# Patient Record
Sex: Male | Born: 1945 | Race: Black or African American | Hispanic: No | Marital: Married | Smoking: Former smoker
Health system: Southern US, Community
[De-identification: ages and names within clinical notes are randomized; demographics above are authoritative.]

## PROBLEM LIST (undated history)

## (undated) DIAGNOSIS — N189 Chronic kidney disease, unspecified: Secondary | ICD-10-CM

## (undated) DIAGNOSIS — E0590 Thyrotoxicosis, unspecified without thyrotoxic crisis or storm: Secondary | ICD-10-CM

## (undated) DIAGNOSIS — I219 Acute myocardial infarction, unspecified: Secondary | ICD-10-CM

## (undated) DIAGNOSIS — I4891 Unspecified atrial fibrillation: Secondary | ICD-10-CM

## (undated) DIAGNOSIS — I87011 Postthrombotic syndrome with ulcer of right lower extremity: Secondary | ICD-10-CM

## (undated) DIAGNOSIS — M109 Gout, unspecified: Secondary | ICD-10-CM

## (undated) DIAGNOSIS — M1990 Unspecified osteoarthritis, unspecified site: Secondary | ICD-10-CM

## (undated) DIAGNOSIS — Z95810 Presence of automatic (implantable) cardiac defibrillator: Secondary | ICD-10-CM

## (undated) DIAGNOSIS — E079 Disorder of thyroid, unspecified: Secondary | ICD-10-CM

## (undated) DIAGNOSIS — I4901 Ventricular fibrillation: Secondary | ICD-10-CM

## (undated) DIAGNOSIS — L89509 Pressure ulcer of unspecified ankle, unspecified stage: Secondary | ICD-10-CM

## (undated) DIAGNOSIS — I82409 Acute embolism and thrombosis of unspecified deep veins of unspecified lower extremity: Secondary | ICD-10-CM

## (undated) DIAGNOSIS — G629 Polyneuropathy, unspecified: Secondary | ICD-10-CM

## (undated) DIAGNOSIS — I255 Ischemic cardiomyopathy: Secondary | ICD-10-CM

## (undated) DIAGNOSIS — L97909 Non-pressure chronic ulcer of unspecified part of unspecified lower leg with unspecified severity: Secondary | ICD-10-CM

## (undated) DIAGNOSIS — I509 Heart failure, unspecified: Secondary | ICD-10-CM

## (undated) DIAGNOSIS — E119 Type 2 diabetes mellitus without complications: Secondary | ICD-10-CM

## (undated) DIAGNOSIS — IMO0001 Reserved for inherently not codable concepts without codable children: Secondary | ICD-10-CM

## (undated) DIAGNOSIS — I83009 Varicose veins of unspecified lower extremity with ulcer of unspecified site: Secondary | ICD-10-CM

## (undated) HISTORY — DX: Chronic kidney disease, unspecified: N18.9

## (undated) HISTORY — DX: Unspecified osteoarthritis, unspecified site: M19.90

## (undated) HISTORY — PX: CORONARY ANGIOPLASTY: SHX604

## (undated) HISTORY — DX: Acute embolism and thrombosis of unspecified deep veins of unspecified lower extremity: I82.409

## (undated) HISTORY — DX: Type 2 diabetes mellitus without complications: E11.9

## (undated) HISTORY — DX: Varicose veins of unspecified lower extremity with ulcer of unspecified site: I83.009

## (undated) HISTORY — DX: Disorder of thyroid, unspecified: E07.9

## (undated) HISTORY — PX: OTHER SURGICAL HISTORY: SHX169

## (undated) HISTORY — DX: Polyneuropathy, unspecified: G62.9

## (undated) HISTORY — DX: Varicose veins of unspecified lower extremity with ulcer of unspecified site: L97.909

## (undated) HISTORY — DX: Unspecified atrial fibrillation: I48.91

## (undated) HISTORY — PX: INSERT / REPLACE / REMOVE PACEMAKER: SUR710

## (undated) HISTORY — DX: Postthrombotic syndrome with ulcer of right lower extremity: I87.011

## (undated) HISTORY — DX: Heart failure, unspecified: I50.9

## (undated) HISTORY — DX: Gout, unspecified: M10.9

## (undated) HISTORY — DX: Ventricular fibrillation: I49.01

## (undated) HISTORY — DX: Ischemic cardiomyopathy: I25.5

---

## 1997-09-12 ENCOUNTER — Encounter (HOSPITAL_COMMUNITY): Admission: RE | Admit: 1997-09-12 | Discharge: 1997-12-11 | Payer: Self-pay | Admitting: Psychiatry

## 2005-05-11 ENCOUNTER — Ambulatory Visit: Payer: Self-pay | Admitting: Internal Medicine

## 2005-05-12 ENCOUNTER — Ambulatory Visit: Payer: Self-pay | Admitting: Internal Medicine

## 2005-05-31 ENCOUNTER — Other Ambulatory Visit: Payer: Self-pay

## 2005-05-31 ENCOUNTER — Inpatient Hospital Stay: Payer: Self-pay | Admitting: Cardiology

## 2009-03-18 ENCOUNTER — Ambulatory Visit: Payer: Self-pay | Admitting: Cardiology

## 2009-03-21 ENCOUNTER — Ambulatory Visit: Payer: Self-pay | Admitting: Cardiology

## 2010-12-23 ENCOUNTER — Other Ambulatory Visit: Payer: Self-pay | Admitting: Internal Medicine

## 2010-12-23 ENCOUNTER — Ambulatory Visit: Payer: Self-pay | Admitting: Internal Medicine

## 2010-12-27 ENCOUNTER — Emergency Department: Payer: Self-pay | Admitting: *Deleted

## 2010-12-31 ENCOUNTER — Ambulatory Visit: Payer: Self-pay | Admitting: Internal Medicine

## 2011-01-13 ENCOUNTER — Ambulatory Visit: Payer: Self-pay | Admitting: Internal Medicine

## 2012-02-23 ENCOUNTER — Ambulatory Visit: Payer: Self-pay | Admitting: Physician Assistant

## 2012-10-23 ENCOUNTER — Ambulatory Visit: Payer: Self-pay | Admitting: Gastroenterology

## 2012-10-23 LAB — CBC WITH DIFFERENTIAL/PLATELET
Basophil %: 0.8 %
Eosinophil #: 0 10*3/uL (ref 0.0–0.7)
Eosinophil %: 0.5 %
HCT: 37.5 % — ABNORMAL LOW (ref 40.0–52.0)
HGB: 12.5 g/dL — ABNORMAL LOW (ref 13.0–18.0)
Lymphocyte #: 1.2 10*3/uL (ref 1.0–3.6)
Lymphocyte %: 22.2 %
Monocyte #: 0.5 x10 3/mm (ref 0.2–1.0)
Neutrophil #: 3.8 10*3/uL (ref 1.4–6.5)
RBC: 3.63 10*6/uL — ABNORMAL LOW (ref 4.40–5.90)
WBC: 5.5 10*3/uL (ref 3.8–10.6)

## 2012-10-23 LAB — PROTIME-INR
INR: 1.3
Prothrombin Time: 16.7 secs — ABNORMAL HIGH (ref 11.5–14.7)
Prothrombin Time: 16.6 secs — ABNORMAL HIGH (ref 11.5–14.7)

## 2012-10-25 LAB — PATHOLOGY REPORT

## 2013-01-03 ENCOUNTER — Inpatient Hospital Stay: Payer: Self-pay | Admitting: Internal Medicine

## 2013-01-03 LAB — CBC
HCT: 37.4 % — ABNORMAL LOW (ref 40.0–52.0)
HGB: 12.6 g/dL — ABNORMAL LOW (ref 13.0–18.0)
MCH: 35.7 pg — ABNORMAL HIGH (ref 26.0–34.0)
MCV: 106 fL — ABNORMAL HIGH (ref 80–100)
RBC: 3.53 10*6/uL — ABNORMAL LOW (ref 4.40–5.90)
WBC: 4.3 10*3/uL (ref 3.8–10.6)

## 2013-01-03 LAB — COMPREHENSIVE METABOLIC PANEL
Albumin: 4 g/dL (ref 3.4–5.0)
Alkaline Phosphatase: 64 U/L (ref 50–136)
Anion Gap: 13 (ref 7–16)
BUN: 9 mg/dL (ref 7–18)
Bilirubin,Total: 0.7 mg/dL (ref 0.2–1.0)
Chloride: 103 mmol/L (ref 98–107)
Co2: 20 mmol/L — ABNORMAL LOW (ref 21–32)
EGFR (Non-African Amer.): 59 — ABNORMAL LOW
Potassium: 3.6 mmol/L (ref 3.5–5.1)
SGOT(AST): 30 U/L (ref 15–37)
Total Protein: 7.8 g/dL (ref 6.4–8.2)

## 2013-01-03 LAB — URINALYSIS, COMPLETE
Bacteria: NONE SEEN
Blood: NEGATIVE
Glucose,UR: NEGATIVE mg/dL (ref 0–75)
Ketone: NEGATIVE
Leukocyte Esterase: NEGATIVE
Ph: 5 (ref 4.5–8.0)
Protein: NEGATIVE
Squamous Epithelial: NONE SEEN

## 2013-01-03 LAB — PROTIME-INR
INR: 1
Prothrombin Time: 13.8 secs (ref 11.5–14.7)

## 2013-01-04 LAB — BASIC METABOLIC PANEL
Calcium, Total: 8.7 mg/dL (ref 8.5–10.1)
Co2: 25 mmol/L (ref 21–32)
EGFR (Non-African Amer.): 44 — ABNORMAL LOW
Glucose: 118 mg/dL — ABNORMAL HIGH (ref 65–99)
Osmolality: 273 (ref 275–301)
Potassium: 3.5 mmol/L (ref 3.5–5.1)
Sodium: 136 mmol/L (ref 136–145)

## 2013-01-04 LAB — CBC WITH DIFFERENTIAL/PLATELET
Basophil #: 0 10*3/uL (ref 0.0–0.1)
Eosinophil %: 1.9 %
HCT: 32.9 % — ABNORMAL LOW (ref 40.0–52.0)
Lymphocyte #: 1.6 10*3/uL (ref 1.0–3.6)
MCH: 36.1 pg — ABNORMAL HIGH (ref 26.0–34.0)
Monocyte #: 0.4 x10 3/mm (ref 0.2–1.0)
Monocyte %: 7.8 %
Neutrophil %: 53.4 %
RBC: 3.13 10*6/uL — ABNORMAL LOW (ref 4.40–5.90)
WBC: 4.6 10*3/uL (ref 3.8–10.6)

## 2013-01-04 LAB — PROTIME-INR: Prothrombin Time: 14 secs (ref 11.5–14.7)

## 2013-01-04 LAB — FOLATE: Folic Acid: 6.1 ng/mL (ref 3.1–100.0)

## 2013-01-05 LAB — CBC WITH DIFFERENTIAL/PLATELET
Basophil #: 0 10*3/uL (ref 0.0–0.1)
Eosinophil #: 0.1 10*3/uL (ref 0.0–0.7)
HCT: 30.8 % — ABNORMAL LOW (ref 40.0–52.0)
MCHC: 33.8 g/dL (ref 32.0–36.0)
MCV: 106 fL — ABNORMAL HIGH (ref 80–100)
Monocyte #: 0.3 x10 3/mm (ref 0.2–1.0)
Neutrophil %: 46.3 %
Platelet: 126 10*3/uL — ABNORMAL LOW (ref 150–440)
RBC: 2.91 10*6/uL — ABNORMAL LOW (ref 4.40–5.90)
WBC: 3.2 10*3/uL — ABNORMAL LOW (ref 3.8–10.6)

## 2013-01-05 LAB — BASIC METABOLIC PANEL
Anion Gap: 6 — ABNORMAL LOW (ref 7–16)
Chloride: 107 mmol/L (ref 98–107)
Creatinine: 1.51 mg/dL — ABNORMAL HIGH (ref 0.60–1.30)
EGFR (African American): 55 — ABNORMAL LOW
EGFR (Non-African Amer.): 47 — ABNORMAL LOW
Osmolality: 278 (ref 275–301)
Sodium: 138 mmol/L (ref 136–145)

## 2013-01-05 LAB — PROTIME-INR
INR: 1.2
Prothrombin Time: 15.6 secs — ABNORMAL HIGH (ref 11.5–14.7)

## 2013-01-06 LAB — CBC WITH DIFFERENTIAL/PLATELET
Basophil #: 0 10*3/uL (ref 0.0–0.1)
Basophil %: 1.1 %
Eosinophil #: 0.1 10*3/uL (ref 0.0–0.7)
Eosinophil %: 2.5 %
HCT: 30.7 % — ABNORMAL LOW (ref 40.0–52.0)
HGB: 10.5 g/dL — ABNORMAL LOW (ref 13.0–18.0)
MCH: 36 pg — ABNORMAL HIGH (ref 26.0–34.0)
MCV: 105 fL — ABNORMAL HIGH (ref 80–100)
Neutrophil %: 41.3 %
Platelet: 131 10*3/uL — ABNORMAL LOW (ref 150–440)
RDW: 16.5 % — ABNORMAL HIGH (ref 11.5–14.5)

## 2013-01-06 LAB — BASIC METABOLIC PANEL
Anion Gap: 7 (ref 7–16)
Chloride: 104 mmol/L (ref 98–107)
Co2: 26 mmol/L (ref 21–32)
Creatinine: 1.29 mg/dL (ref 0.60–1.30)
EGFR (African American): >60
EGFR (Non-African Amer.): 57 — ABNORMAL LOW
Glucose: 100 mg/dL — ABNORMAL HIGH (ref 65–99)
Potassium: 3.4 mmol/L — ABNORMAL LOW (ref 3.5–5.1)

## 2013-01-06 LAB — PROTIME-INR: Prothrombin Time: 16.5 secs — ABNORMAL HIGH (ref 11.5–14.7)

## 2013-05-22 ENCOUNTER — Ambulatory Visit: Payer: Self-pay | Admitting: Internal Medicine

## 2013-09-02 ENCOUNTER — Inpatient Hospital Stay: Payer: Self-pay | Admitting: Internal Medicine

## 2013-09-02 LAB — CK TOTAL AND CKMB (NOT AT ARMC)
CK, Total: 58 U/L
CK-MB: 1.2 ng/mL (ref 0.5–3.6)

## 2013-09-02 LAB — CBC
HCT: 37.4 % — AB (ref 40.0–52.0)
HGB: 11.9 g/dL — AB (ref 13.0–18.0)
MCH: 27.2 pg (ref 26.0–34.0)
MCHC: 31.9 g/dL — ABNORMAL LOW (ref 32.0–36.0)
MCV: 85 fL (ref 80–100)
PLATELETS: 132 10*3/uL — AB (ref 150–440)
RBC: 4.37 10*6/uL — AB (ref 4.40–5.90)
RDW: 19 % — ABNORMAL HIGH (ref 11.5–14.5)
WBC: 3.1 10*3/uL — ABNORMAL LOW (ref 3.8–10.6)

## 2013-09-02 LAB — PROTIME-INR
INR: 1.2
Prothrombin Time: 15.3 secs — ABNORMAL HIGH (ref 11.5–14.7)

## 2013-09-02 LAB — BASIC METABOLIC PANEL
Anion Gap: 7 (ref 7–16)
BUN: 29 mg/dL — ABNORMAL HIGH (ref 7–18)
CALCIUM: 9.1 mg/dL (ref 8.5–10.1)
CO2: 24 mmol/L (ref 21–32)
Chloride: 106 mmol/L (ref 98–107)
Creatinine: 1.3 mg/dL (ref 0.60–1.30)
EGFR (African American): >60
GFR CALC NON AF AMER: 56 — AB
Glucose: 152 mg/dL — ABNORMAL HIGH (ref 65–99)
Osmolality: 283 (ref 275–301)
Potassium: 4 mmol/L (ref 3.5–5.1)
SODIUM: 137 mmol/L (ref 136–145)

## 2013-09-02 LAB — CK-MB: CK-MB: 1.3 ng/mL (ref 0.5–3.6)

## 2013-09-02 LAB — TROPONIN I
TROPONIN-I: 0.07 ng/mL — AB
TROPONIN-I: 0.07 ng/mL — AB
Troponin-I: <0.02 ng/mL

## 2013-09-02 LAB — MAGNESIUM: Magnesium: 2 mg/dL

## 2013-09-02 LAB — APTT
Activated PTT: 29.4 secs (ref 23.6–35.9)
Activated PTT: 83.9 secs — ABNORMAL HIGH (ref 23.6–35.9)

## 2013-09-03 LAB — PLATELET COUNT: Platelet: 140 10*3/uL — ABNORMAL LOW (ref 150–440)

## 2013-09-03 LAB — APTT
Activated PTT: 67.9 secs — ABNORMAL HIGH (ref 23.6–35.9)
Activated PTT: 70.7 secs — ABNORMAL HIGH (ref 23.6–35.9)

## 2013-09-04 LAB — CBC WITH DIFFERENTIAL/PLATELET
BASOS ABS: 0 10*3/uL (ref 0.0–0.1)
Basophil %: 1.1 %
EOS PCT: 3.9 %
Eosinophil #: 0.1 10*3/uL (ref 0.0–0.7)
HCT: 35.6 % — AB (ref 40.0–52.0)
HGB: 11 g/dL — ABNORMAL LOW (ref 13.0–18.0)
LYMPHS ABS: 1.3 10*3/uL (ref 1.0–3.6)
Lymphocyte %: 41.6 %
MCH: 26.5 pg (ref 26.0–34.0)
MCHC: 31 g/dL — AB (ref 32.0–36.0)
MCV: 86 fL (ref 80–100)
Monocyte #: 0.3 x10 3/mm (ref 0.2–1.0)
Monocyte %: 8.8 %
NEUTROS ABS: 1.4 10*3/uL (ref 1.4–6.5)
Neutrophil %: 44.6 %
PLATELETS: 141 10*3/uL — AB (ref 150–440)
RBC: 4.17 10*6/uL — ABNORMAL LOW (ref 4.40–5.90)
RDW: 18.9 % — ABNORMAL HIGH (ref 11.5–14.5)
WBC: 3.2 10*3/uL — ABNORMAL LOW (ref 3.8–10.6)

## 2013-09-04 LAB — BASIC METABOLIC PANEL
Anion Gap: 8 (ref 7–16)
BUN: 34 mg/dL — ABNORMAL HIGH (ref 7–18)
CALCIUM: 8.9 mg/dL (ref 8.5–10.1)
CHLORIDE: 104 mmol/L (ref 98–107)
CREATININE: 1.35 mg/dL — AB (ref 0.60–1.30)
Co2: 26 mmol/L (ref 21–32)
EGFR (African American): >60
EGFR (Non-African Amer.): 54 — ABNORMAL LOW
Glucose: 136 mg/dL — ABNORMAL HIGH (ref 65–99)
OSMOLALITY: 285 (ref 275–301)
Potassium: 3.6 mmol/L (ref 3.5–5.1)
SODIUM: 138 mmol/L (ref 136–145)

## 2013-09-04 LAB — APTT: Activated PTT: 91.2 secs — ABNORMAL HIGH (ref 23.6–35.9)

## 2013-09-05 LAB — CBC WITH DIFFERENTIAL/PLATELET
BASOS ABS: 0 10*3/uL (ref 0.0–0.1)
Basophil %: 0.7 %
Eosinophil #: 0.1 10*3/uL (ref 0.0–0.7)
Eosinophil %: 3.2 %
HCT: 36.6 % — ABNORMAL LOW (ref 40.0–52.0)
HGB: 11.5 g/dL — ABNORMAL LOW (ref 13.0–18.0)
LYMPHS ABS: 1.2 10*3/uL (ref 1.0–3.6)
LYMPHS PCT: 29.3 %
MCH: 26.9 pg (ref 26.0–34.0)
MCHC: 31.5 g/dL — AB (ref 32.0–36.0)
MCV: 85 fL (ref 80–100)
MONO ABS: 0.4 x10 3/mm (ref 0.2–1.0)
Monocyte %: 9.9 %
Neutrophil #: 2.3 10*3/uL (ref 1.4–6.5)
Neutrophil %: 56.9 %
PLATELETS: 143 10*3/uL — AB (ref 150–440)
RBC: 4.29 10*6/uL — AB (ref 4.40–5.90)
RDW: 19 % — AB (ref 11.5–14.5)
WBC: 4 10*3/uL (ref 3.8–10.6)

## 2013-09-05 LAB — BASIC METABOLIC PANEL
Anion Gap: 8 (ref 7–16)
BUN: 36 mg/dL — ABNORMAL HIGH (ref 7–18)
CALCIUM: 9.4 mg/dL (ref 8.5–10.1)
CHLORIDE: 100 mmol/L (ref 98–107)
CO2: 26 mmol/L (ref 21–32)
CREATININE: 1.28 mg/dL (ref 0.60–1.30)
EGFR (African American): >60
EGFR (Non-African Amer.): 57 — ABNORMAL LOW
Glucose: 125 mg/dL — ABNORMAL HIGH (ref 65–99)
Osmolality: 278 (ref 275–301)
Potassium: 3.9 mmol/L (ref 3.5–5.1)
Sodium: 134 mmol/L — ABNORMAL LOW (ref 136–145)

## 2013-09-12 DIAGNOSIS — Z86718 Personal history of other venous thrombosis and embolism: Secondary | ICD-10-CM | POA: Insufficient documentation

## 2013-10-02 DIAGNOSIS — I4891 Unspecified atrial fibrillation: Secondary | ICD-10-CM | POA: Insufficient documentation

## 2013-10-11 DIAGNOSIS — I25810 Atherosclerosis of coronary artery bypass graft(s) without angina pectoris: Secondary | ICD-10-CM | POA: Insufficient documentation

## 2013-10-17 ENCOUNTER — Emergency Department: Payer: Self-pay | Admitting: Emergency Medicine

## 2013-10-17 ENCOUNTER — Ambulatory Visit: Payer: Self-pay | Admitting: Cardiology

## 2013-10-17 LAB — PROTIME-INR
INR: 4
Prothrombin Time: 37.7 secs — ABNORMAL HIGH (ref 11.5–14.7)

## 2013-10-22 ENCOUNTER — Ambulatory Visit: Payer: Self-pay | Admitting: Internal Medicine

## 2014-05-08 ENCOUNTER — Inpatient Hospital Stay: Payer: Self-pay | Admitting: Internal Medicine

## 2014-05-08 LAB — BASIC METABOLIC PANEL
Anion Gap: 10 (ref 7–16)
BUN: 38 mg/dL — ABNORMAL HIGH (ref 7–18)
CO2: 25 mmol/L (ref 21–32)
CREATININE: 2.2 mg/dL — AB (ref 0.60–1.30)
Calcium, Total: 9.2 mg/dL (ref 8.5–10.1)
Chloride: 100 mmol/L (ref 98–107)
EGFR (Non-African Amer.): 32 — ABNORMAL LOW
GFR CALC AF AMER: 38 — AB
Glucose: 175 mg/dL — ABNORMAL HIGH (ref 65–99)
Osmolality: 283 (ref 275–301)
Potassium: 4.5 mmol/L (ref 3.5–5.1)
SODIUM: 135 mmol/L — AB (ref 136–145)

## 2014-05-08 LAB — HEPATIC FUNCTION PANEL A (ARMC)
ALT: 65 U/L — AB (ref 14–63)
AST: 60 U/L — AB (ref 15–37)
Albumin: 3.5 g/dL (ref 3.4–5.0)
Alkaline Phosphatase: 149 U/L — ABNORMAL HIGH (ref 46–116)
Bilirubin, Direct: 0.7 mg/dL — ABNORMAL HIGH (ref 0.0–0.2)
Bilirubin,Total: 1.4 mg/dL — ABNORMAL HIGH (ref 0.2–1.0)
TOTAL PROTEIN: 6.8 g/dL (ref 6.4–8.2)

## 2014-05-08 LAB — TROPONIN I: Troponin-I: 0.03 ng/mL

## 2014-05-08 LAB — CBC
HCT: 42.4 % (ref 40.0–52.0)
HGB: 13.2 g/dL (ref 13.0–18.0)
MCH: 27 pg (ref 26.0–34.0)
MCHC: 31.2 g/dL — ABNORMAL LOW (ref 32.0–36.0)
MCV: 86 fL (ref 80–100)
PLATELETS: 205 10*3/uL (ref 150–440)
RBC: 4.91 10*6/uL (ref 4.40–5.90)
RDW: 18.7 % — AB (ref 11.5–14.5)
WBC: 5.1 10*3/uL (ref 3.8–10.6)

## 2014-05-08 LAB — PROTIME-INR
INR: 4.1
PROTHROMBIN TIME: 38.6 s — AB (ref 11.5–14.7)

## 2014-05-08 LAB — SEDIMENTATION RATE: Erythrocyte Sed Rate: 7 mm/hr (ref 0–20)

## 2014-05-08 LAB — TSH: Thyroid Stimulating Horm: 87.3 u[IU]/mL — ABNORMAL HIGH

## 2014-05-08 LAB — T4, FREE: FREE THYROXINE: 0.81 ng/dL (ref 0.76–1.46)

## 2014-05-09 LAB — BASIC METABOLIC PANEL
Anion Gap: 12 (ref 7–16)
BUN: 43 mg/dL — ABNORMAL HIGH (ref 7–18)
Calcium, Total: 8.8 mg/dL (ref 8.5–10.1)
Chloride: 104 mmol/L (ref 98–107)
Co2: 20 mmol/L — ABNORMAL LOW (ref 21–32)
Creatinine: 2.2 mg/dL — ABNORMAL HIGH (ref 0.60–1.30)
EGFR (African American): 38 — ABNORMAL LOW
EGFR (Non-African Amer.): 32 — ABNORMAL LOW
Glucose: 108 mg/dL — ABNORMAL HIGH (ref 65–99)
Osmolality: 283 (ref 275–301)
Potassium: 4.5 mmol/L (ref 3.5–5.1)
Sodium: 136 mmol/L (ref 136–145)

## 2014-05-09 LAB — URINALYSIS, COMPLETE
BLOOD: NEGATIVE
Bacteria: NONE SEEN
Bilirubin,UR: NEGATIVE
GLUCOSE, UR: NEGATIVE mg/dL (ref 0–75)
Ketone: NEGATIVE
Leukocyte Esterase: NEGATIVE
Nitrite: NEGATIVE
PROTEIN: NEGATIVE
Ph: 5 (ref 4.5–8.0)
RBC,UR: NONE SEEN /HPF (ref 0–5)
SPECIFIC GRAVITY: 1.012 (ref 1.003–1.030)
Squamous Epithelial: NONE SEEN

## 2014-05-09 LAB — CK: CK, Total: 99 U/L (ref 39–308)

## 2014-05-09 LAB — PROTIME-INR
INR: 5.8
Prothrombin Time: 50 secs — ABNORMAL HIGH (ref 11.5–14.7)

## 2014-05-10 LAB — COMPREHENSIVE METABOLIC PANEL
ALK PHOS: 150 U/L — AB (ref 46–116)
ALT: 87 U/L — AB (ref 14–63)
Albumin: 3.5 g/dL (ref 3.4–5.0)
Anion Gap: 10 (ref 7–16)
BUN: 42 mg/dL — AB (ref 7–18)
Bilirubin,Total: 1.9 mg/dL — ABNORMAL HIGH (ref 0.2–1.0)
CO2: 24 mmol/L (ref 21–32)
CREATININE: 2.19 mg/dL — AB (ref 0.60–1.30)
Calcium, Total: 9.1 mg/dL (ref 8.5–10.1)
Chloride: 100 mmol/L (ref 98–107)
GFR CALC AF AMER: 39 — AB
GFR CALC NON AF AMER: 32 — AB
Glucose: 120 mg/dL — ABNORMAL HIGH (ref 65–99)
Osmolality: 280 (ref 275–301)
Potassium: 3.9 mmol/L (ref 3.5–5.1)
SGOT(AST): 96 U/L — ABNORMAL HIGH (ref 15–37)
Sodium: 134 mmol/L — ABNORMAL LOW (ref 136–145)
TOTAL PROTEIN: 6.7 g/dL (ref 6.4–8.2)

## 2014-05-10 LAB — PROTIME-INR
INR: 3.5
PROTHROMBIN TIME: 33.8 s — AB (ref 11.5–14.7)

## 2014-05-11 LAB — BASIC METABOLIC PANEL
Anion Gap: 10 (ref 7–16)
BUN: 45 mg/dL — ABNORMAL HIGH (ref 7–18)
CREATININE: 2.16 mg/dL — AB (ref 0.60–1.30)
Calcium, Total: 8.9 mg/dL (ref 8.5–10.1)
Chloride: 101 mmol/L (ref 98–107)
Co2: 23 mmol/L (ref 21–32)
EGFR (Non-African Amer.): 32 — ABNORMAL LOW
GFR CALC AF AMER: 39 — AB
Glucose: 103 mg/dL — ABNORMAL HIGH (ref 65–99)
OSMOLALITY: 280 (ref 275–301)
Potassium: 4.1 mmol/L (ref 3.5–5.1)
Sodium: 134 mmol/L — ABNORMAL LOW (ref 136–145)

## 2014-05-11 LAB — T4, FREE: FREE THYROXINE: 1.1 ng/dL (ref 0.76–1.46)

## 2014-05-12 LAB — CBC WITH DIFFERENTIAL/PLATELET
Basophil #: 0 10*3/uL (ref 0.0–0.1)
Basophil %: 0.5 %
Eosinophil #: 0.1 10*3/uL (ref 0.0–0.7)
Eosinophil %: 1 %
HCT: 39.6 % — ABNORMAL LOW (ref 40.0–52.0)
HGB: 12.4 g/dL — ABNORMAL LOW (ref 13.0–18.0)
Lymphocyte #: 1.1 10*3/uL (ref 1.0–3.6)
Lymphocyte %: 20.9 %
MCH: 26.7 pg (ref 26.0–34.0)
MCHC: 31.3 g/dL — ABNORMAL LOW (ref 32.0–36.0)
MCV: 85 fL (ref 80–100)
Monocyte #: 0.4 x10 3/mm (ref 0.2–1.0)
Monocyte %: 8.2 %
Neutrophil #: 3.7 10*3/uL (ref 1.4–6.5)
Neutrophil %: 69.4 %
Platelet: 145 10*3/uL — ABNORMAL LOW (ref 150–440)
RBC: 4.64 10*6/uL (ref 4.40–5.90)
RDW: 18.4 % — ABNORMAL HIGH (ref 11.5–14.5)
WBC: 5.3 10*3/uL (ref 3.8–10.6)

## 2014-05-12 LAB — COMPREHENSIVE METABOLIC PANEL
Albumin: 3.3 g/dL — ABNORMAL LOW (ref 3.4–5.0)
Alkaline Phosphatase: 148 U/L — ABNORMAL HIGH (ref 46–116)
Anion Gap: 10 (ref 7–16)
BUN: 43 mg/dL — ABNORMAL HIGH (ref 7–18)
Bilirubin,Total: 2 mg/dL — ABNORMAL HIGH (ref 0.2–1.0)
Calcium, Total: 8.9 mg/dL (ref 8.5–10.1)
Chloride: 103 mmol/L (ref 98–107)
Co2: 22 mmol/L (ref 21–32)
Creatinine: 2.08 mg/dL — ABNORMAL HIGH (ref 0.60–1.30)
EGFR (African American): 41 — ABNORMAL LOW
EGFR (Non-African Amer.): 34 — ABNORMAL LOW
Glucose: 78 mg/dL (ref 65–99)
Osmolality: 280 (ref 275–301)
Potassium: 4 mmol/L (ref 3.5–5.1)
SGOT(AST): 377 U/L — ABNORMAL HIGH (ref 15–37)
SGPT (ALT): 245 U/L — ABNORMAL HIGH (ref 14–63)
Sodium: 135 mmol/L — ABNORMAL LOW (ref 136–145)
Total Protein: 6.5 g/dL (ref 6.4–8.2)

## 2014-05-12 LAB — PROTIME-INR
INR: 3.1
Prothrombin Time: 32.1 secs — ABNORMAL HIGH

## 2014-05-13 LAB — COMPREHENSIVE METABOLIC PANEL
Albumin: 3.2 g/dL — ABNORMAL LOW (ref 3.4–5.0)
Alkaline Phosphatase: 137 U/L — ABNORMAL HIGH (ref 46–116)
Anion Gap: 9 (ref 7–16)
BUN: 38 mg/dL — ABNORMAL HIGH (ref 7–18)
Bilirubin,Total: 1.8 mg/dL — ABNORMAL HIGH (ref 0.2–1.0)
Calcium, Total: 8.7 mg/dL (ref 8.5–10.1)
Chloride: 102 mmol/L (ref 98–107)
Co2: 25 mmol/L (ref 21–32)
Creatinine: 2.03 mg/dL — ABNORMAL HIGH (ref 0.60–1.30)
EGFR (African American): 42 — ABNORMAL LOW
EGFR (Non-African Amer.): 35 — ABNORMAL LOW
Glucose: 104 mg/dL — ABNORMAL HIGH (ref 65–99)
Osmolality: 281 (ref 275–301)
Potassium: 3.7 mmol/L (ref 3.5–5.1)
SGOT(AST): 252 U/L — ABNORMAL HIGH (ref 15–37)
SGPT (ALT): 206 U/L — ABNORMAL HIGH (ref 14–63)
Sodium: 136 mmol/L (ref 136–145)
Total Protein: 6.3 g/dL — ABNORMAL LOW (ref 6.4–8.2)

## 2014-05-13 LAB — CBC WITH DIFFERENTIAL/PLATELET
Basophil #: 0.1 10*3/uL (ref 0.0–0.1)
Basophil %: 1.6 %
Eosinophil #: 0.1 10*3/uL (ref 0.0–0.7)
Eosinophil %: 2.8 %
HCT: 37.7 % — ABNORMAL LOW (ref 40.0–52.0)
HGB: 12 g/dL — ABNORMAL LOW (ref 13.0–18.0)
Lymphocyte #: 1.3 10*3/uL (ref 1.0–3.6)
Lymphocyte %: 36 %
MCH: 27.2 pg (ref 26.0–34.0)
MCHC: 31.9 g/dL — ABNORMAL LOW (ref 32.0–36.0)
MCV: 85 fL (ref 80–100)
Monocyte #: 0.4 x10 3/mm (ref 0.2–1.0)
Monocyte %: 9.8 %
Neutrophil #: 1.8 10*3/uL (ref 1.4–6.5)
Neutrophil %: 49.8 %
Platelet: 135 10*3/uL — ABNORMAL LOW (ref 150–440)
RBC: 4.42 10*6/uL (ref 4.40–5.90)
RDW: 18.3 % — ABNORMAL HIGH (ref 11.5–14.5)
WBC: 3.6 10*3/uL — ABNORMAL LOW (ref 3.8–10.6)

## 2014-05-13 LAB — PROTIME-INR
INR: 2.7
Prothrombin Time: 28.5 secs — ABNORMAL HIGH

## 2014-05-13 LAB — TSH: Thyroid Stimulating Horm: 47.5 u[IU]/mL — ABNORMAL HIGH

## 2014-05-14 LAB — COMPREHENSIVE METABOLIC PANEL
ANION GAP: 12 (ref 7–16)
Albumin: 3.5 g/dL (ref 3.4–5.0)
Alkaline Phosphatase: 149 U/L — ABNORMAL HIGH (ref 46–116)
BILIRUBIN TOTAL: 1.6 mg/dL — AB (ref 0.2–1.0)
BUN: 38 mg/dL — ABNORMAL HIGH (ref 7–18)
CHLORIDE: 101 mmol/L (ref 98–107)
Calcium, Total: 8.7 mg/dL (ref 8.5–10.1)
Co2: 23 mmol/L (ref 21–32)
Creatinine: 2.1 mg/dL — ABNORMAL HIGH (ref 0.60–1.30)
EGFR (Non-African Amer.): 33 — ABNORMAL LOW
GFR CALC AF AMER: 41 — AB
GLUCOSE: 158 mg/dL — AB (ref 65–99)
OSMOLALITY: 284 (ref 275–301)
POTASSIUM: 4 mmol/L (ref 3.5–5.1)
SGOT(AST): 181 U/L — ABNORMAL HIGH (ref 15–37)
SGPT (ALT): 195 U/L — ABNORMAL HIGH (ref 14–63)
SODIUM: 136 mmol/L (ref 136–145)
Total Protein: 6.8 g/dL (ref 6.4–8.2)

## 2014-05-14 LAB — PROTIME-INR
INR: 2.9
Prothrombin Time: 30.3 secs — ABNORMAL HIGH

## 2014-05-15 LAB — PROTEIN ELECTROPHORESIS(ARMC)

## 2014-05-15 LAB — KAPPA/LAMBDA FREE LIGHT CHAINS (ARMC)

## 2014-05-20 LAB — UR PROT ELECTROPHORESIS, URINE RANDOM

## 2014-05-22 DIAGNOSIS — E034 Atrophy of thyroid (acquired): Secondary | ICD-10-CM | POA: Insufficient documentation

## 2014-06-11 ENCOUNTER — Ambulatory Visit
Admit: 2014-06-11 | Disposition: A | Discharge status: Home / Self Care | Payer: Self-pay | Attending: Internal Medicine | Admitting: Internal Medicine

## 2014-06-20 ENCOUNTER — Emergency Department: Payer: Self-pay | Admitting: Emergency Medicine

## 2014-06-28 ENCOUNTER — Inpatient Hospital Stay: Payer: Self-pay | Admitting: Internal Medicine

## 2014-07-02 HISTORY — PX: FISTULAGRAM (ARMC HX): HXRAD1277

## 2014-07-09 ENCOUNTER — Encounter
Admit: 2014-07-09 | Disposition: A | Discharge status: Home / Self Care | Payer: Self-pay | Attending: Surgery | Admitting: Surgery

## 2014-07-12 ENCOUNTER — Encounter
Admit: 2014-07-12 | Disposition: A | Discharge status: Home / Self Care | Payer: Self-pay | Attending: Surgery | Admitting: Surgery

## 2014-07-12 ENCOUNTER — Ambulatory Visit
Admit: 2014-07-12 | Disposition: A | Discharge status: Home / Self Care | Payer: Self-pay | Attending: Internal Medicine | Admitting: Internal Medicine

## 2014-07-17 ENCOUNTER — Encounter: Payer: Self-pay | Admitting: Vascular Surgery

## 2014-07-17 ENCOUNTER — Other Ambulatory Visit: Payer: Self-pay | Admitting: *Deleted

## 2014-07-17 DIAGNOSIS — R609 Edema, unspecified: Secondary | ICD-10-CM

## 2014-07-19 LAB — CBC WITH DIFFERENTIAL/PLATELET
Basophil #: 0 10*3/uL (ref 0.0–0.1)
Basophil %: 0.8 %
EOS ABS: 0 10*3/uL (ref 0.0–0.7)
Eosinophil %: 0.8 %
HCT: 39.5 % — AB (ref 40.0–52.0)
HGB: 12.2 g/dL — AB (ref 13.0–18.0)
Lymphocyte #: 1.1 10*3/uL (ref 1.0–3.6)
Lymphocyte %: 22.1 %
MCH: 25.5 pg — AB (ref 26.0–34.0)
MCHC: 30.8 g/dL — AB (ref 32.0–36.0)
MCV: 83 fL (ref 80–100)
MONO ABS: 0.5 x10 3/mm (ref 0.2–1.0)
Monocyte %: 10.8 %
NEUTROS ABS: 3.3 10*3/uL (ref 1.4–6.5)
Neutrophil %: 65.5 %
Platelet: 208 10*3/uL (ref 150–440)
RBC: 4.78 10*6/uL (ref 4.40–5.90)
RDW: 20.5 % — ABNORMAL HIGH (ref 11.5–14.5)
WBC: 5 10*3/uL (ref 3.8–10.6)

## 2014-07-19 LAB — COMPREHENSIVE METABOLIC PANEL
AST: 39 U/L
Albumin: 3.9 g/dL
Alkaline Phosphatase: 129 U/L — ABNORMAL HIGH
Anion Gap: 10 (ref 7–16)
BUN: 36 mg/dL — ABNORMAL HIGH
Bilirubin,Total: 1.9 mg/dL — ABNORMAL HIGH
CALCIUM: 9.3 mg/dL
CHLORIDE: 97 mmol/L — AB
Co2: 23 mmol/L
Creatinine: 2.19 mg/dL — ABNORMAL HIGH
EGFR (African American): 34 — ABNORMAL LOW
GFR CALC NON AF AMER: 30 — AB
Glucose: 157 mg/dL — ABNORMAL HIGH
Potassium: 5.4 mmol/L — ABNORMAL HIGH
SGPT (ALT): 24 U/L
Sodium: 130 mmol/L — ABNORMAL LOW
TOTAL PROTEIN: 7.3 g/dL

## 2014-07-19 LAB — PROTIME-INR
INR: 12.4
Prothrombin Time: 92.6 secs — ABNORMAL HIGH

## 2014-07-19 LAB — AMMONIA: Ammonia, Plasma: 18 umol/L

## 2014-07-20 LAB — CBC WITH DIFFERENTIAL/PLATELET
BASOS PCT: 1.4 %
Basophil #: 0.1 10*3/uL (ref 0.0–0.1)
EOS ABS: 0.1 10*3/uL (ref 0.0–0.7)
EOS PCT: 1.6 %
HCT: 38.3 % — ABNORMAL LOW (ref 40.0–52.0)
HGB: 11.9 g/dL — ABNORMAL LOW (ref 13.0–18.0)
Lymphocyte #: 1.3 10*3/uL (ref 1.0–3.6)
Lymphocyte %: 27.4 %
MCH: 25.7 pg — ABNORMAL LOW (ref 26.0–34.0)
MCHC: 31.2 g/dL — AB (ref 32.0–36.0)
MCV: 82 fL (ref 80–100)
MONO ABS: 0.5 x10 3/mm (ref 0.2–1.0)
Monocyte %: 10.2 %
NEUTROS PCT: 59.4 %
Neutrophil #: 2.9 10*3/uL (ref 1.4–6.5)
PLATELETS: 205 10*3/uL (ref 150–440)
RBC: 4.64 10*6/uL (ref 4.40–5.90)
RDW: 21.1 % — ABNORMAL HIGH (ref 11.5–14.5)
WBC: 4.8 10*3/uL (ref 3.8–10.6)

## 2014-07-20 LAB — BASIC METABOLIC PANEL
ANION GAP: 10 (ref 7–16)
BUN: 38 mg/dL — ABNORMAL HIGH
CHLORIDE: 98 mmol/L — AB
Calcium, Total: 9.2 mg/dL
Co2: 23 mmol/L
Creatinine: 2.22 mg/dL — ABNORMAL HIGH
EGFR (African American): 34 — ABNORMAL LOW
EGFR (Non-African Amer.): 29 — ABNORMAL LOW
Glucose: 94 mg/dL
Potassium: 5.5 mmol/L — ABNORMAL HIGH
SODIUM: 131 mmol/L — AB

## 2014-07-20 LAB — PROTIME-INR
INR: 12.6
Prothrombin Time: 94 secs — ABNORMAL HIGH

## 2014-07-21 ENCOUNTER — Inpatient Hospital Stay
Admit: 2014-07-21 | Disposition: A | Discharge status: Home / Self Care | Payer: Self-pay | Attending: Internal Medicine | Admitting: Internal Medicine

## 2014-07-21 LAB — BASIC METABOLIC PANEL
ANION GAP: 9 (ref 7–16)
BUN: 38 mg/dL — ABNORMAL HIGH
CALCIUM: 9 mg/dL
CO2: 22 mmol/L
Chloride: 101 mmol/L
Creatinine: 2.13 mg/dL — ABNORMAL HIGH
GFR CALC AF AMER: 36 — AB
GFR CALC NON AF AMER: 31 — AB
Glucose: 53 mg/dL — ABNORMAL LOW
POTASSIUM: 4.7 mmol/L
SODIUM: 132 mmol/L — AB

## 2014-07-21 LAB — CBC WITH DIFFERENTIAL/PLATELET
Basophil: 1 %
HCT: 38.5 % — ABNORMAL LOW (ref 40.0–52.0)
HGB: 12.2 g/dL — ABNORMAL LOW (ref 13.0–18.0)
LYMPHS PCT: 25 %
MCH: 25.8 pg — ABNORMAL LOW (ref 26.0–34.0)
MCHC: 31.7 g/dL — ABNORMAL LOW (ref 32.0–36.0)
MCV: 82 fL (ref 80–100)
MONOS PCT: 10 %
NRBC/100 WBC: 3 /
PLATELETS: 203 10*3/uL (ref 150–440)
RBC: 4.72 10*6/uL (ref 4.40–5.90)
RDW: 21.1 % — ABNORMAL HIGH (ref 11.5–14.5)
Segmented Neutrophils: 64 %
WBC: 5.8 10*3/uL (ref 3.8–10.6)

## 2014-07-21 LAB — PROTIME-INR
INR: 2.6
PROTHROMBIN TIME: 27.9 s — AB

## 2014-07-22 LAB — CBC WITH DIFFERENTIAL/PLATELET
Basophil: 1 %
Eosinophil: 2 %
HCT: 37.1 % — ABNORMAL LOW (ref 40.0–52.0)
HGB: 11.6 g/dL — ABNORMAL LOW (ref 13.0–18.0)
LYMPHS PCT: 22 %
MCH: 25.5 pg — ABNORMAL LOW (ref 26.0–34.0)
MCHC: 31.2 g/dL — ABNORMAL LOW (ref 32.0–36.0)
MCV: 82 fL (ref 80–100)
Monocytes: 8 %
NRBC/100 WBC: 2 /
PLATELETS: 192 10*3/uL (ref 150–440)
RBC: 4.54 10*6/uL (ref 4.40–5.90)
RDW: 21 % — ABNORMAL HIGH (ref 11.5–14.5)
Segmented Neutrophils: 67 %
WBC: 5.1 10*3/uL (ref 3.8–10.6)

## 2014-07-22 LAB — BASIC METABOLIC PANEL
ANION GAP: 11 (ref 7–16)
BUN: 38 mg/dL — ABNORMAL HIGH
Calcium, Total: 9.1 mg/dL
Chloride: 99 mmol/L — ABNORMAL LOW
Co2: 22 mmol/L
Creatinine: 1.86 mg/dL — ABNORMAL HIGH
EGFR (African American): 42 — ABNORMAL LOW
GFR CALC NON AF AMER: 36 — AB
Glucose: 107 mg/dL — ABNORMAL HIGH
POTASSIUM: 4 mmol/L
Sodium: 132 mmol/L — ABNORMAL LOW

## 2014-07-22 LAB — PROTIME-INR
INR: 1.8
PROTHROMBIN TIME: 20.9 s — AB

## 2014-07-22 LAB — TSH: Thyroid Stimulating Horm: 13.968 u[IU]/mL — ABNORMAL HIGH

## 2014-07-23 LAB — COMPREHENSIVE METABOLIC PANEL
Albumin: 3.3 g/dL — ABNORMAL LOW
Alkaline Phosphatase: 123 U/L
Anion Gap: 9 (ref 7–16)
BILIRUBIN TOTAL: 3.9 mg/dL — AB
BUN: 35 mg/dL — AB
CO2: 28 mmol/L
CREATININE: 1.66 mg/dL — AB
Calcium, Total: 8.5 mg/dL — ABNORMAL LOW
Chloride: 95 mmol/L — ABNORMAL LOW
GFR CALC AF AMER: 48 — AB
GFR CALC NON AF AMER: 41 — AB
Glucose: 98 mg/dL
Potassium: 2.5 mmol/L — CL
SGOT(AST): 190 U/L — ABNORMAL HIGH
SGPT (ALT): 137 U/L — ABNORMAL HIGH
Sodium: 132 mmol/L — ABNORMAL LOW
TOTAL PROTEIN: 6.2 g/dL — AB

## 2014-07-23 LAB — CBC WITH DIFFERENTIAL/PLATELET
Basophil #: 0 10*3/uL (ref 0.0–0.1)
Basophil %: 0.8 %
EOS ABS: 0.1 10*3/uL (ref 0.0–0.7)
EOS PCT: 1.8 %
HCT: 35.3 % — ABNORMAL LOW (ref 40.0–52.0)
HGB: 11 g/dL — ABNORMAL LOW (ref 13.0–18.0)
LYMPHS ABS: 0.7 10*3/uL — AB (ref 1.0–3.6)
Lymphocyte %: 16.1 %
MCH: 25.3 pg — AB (ref 26.0–34.0)
MCHC: 31.1 g/dL — ABNORMAL LOW (ref 32.0–36.0)
MCV: 82 fL (ref 80–100)
MONO ABS: 0.3 x10 3/mm (ref 0.2–1.0)
Monocyte %: 7.3 %
NEUTROS PCT: 74 %
Neutrophil #: 3.3 10*3/uL (ref 1.4–6.5)
PLATELETS: 184 10*3/uL (ref 150–440)
RBC: 4.34 10*6/uL — AB (ref 4.40–5.90)
RDW: 20.6 % — AB (ref 11.5–14.5)
WBC: 4.5 10*3/uL (ref 3.8–10.6)

## 2014-07-23 LAB — MAGNESIUM: Magnesium: 1.9 mg/dL

## 2014-07-24 LAB — COMPREHENSIVE METABOLIC PANEL
ALT: 110 U/L — AB
AST: 110 U/L — AB
Albumin: 3.5 g/dL
Alkaline Phosphatase: 123 U/L
Anion Gap: 9 (ref 7–16)
BILIRUBIN TOTAL: 3.5 mg/dL — AB
BUN: 34 mg/dL — ABNORMAL HIGH
CHLORIDE: 93 mmol/L — AB
Calcium, Total: 8.7 mg/dL — ABNORMAL LOW
Co2: 31 mmol/L
Creatinine: 1.52 mg/dL — ABNORMAL HIGH
EGFR (Non-African Amer.): 46 — ABNORMAL LOW
GFR CALC AF AMER: 53 — AB
GLUCOSE: 109 mg/dL — AB
Potassium: 2.3 mmol/L — CL
Sodium: 133 mmol/L — ABNORMAL LOW
TOTAL PROTEIN: 6.5 g/dL

## 2014-07-25 LAB — BASIC METABOLIC PANEL
Anion Gap: 13 (ref 7–16)
BUN: 36 mg/dL — ABNORMAL HIGH
Calcium, Total: 8.7 mg/dL — ABNORMAL LOW
Chloride: 91 mmol/L — ABNORMAL LOW
Co2: 30 mmol/L
Creatinine: 1.52 mg/dL — ABNORMAL HIGH
EGFR (African American): 53 — ABNORMAL LOW
EGFR (Non-African Amer.): 46 — ABNORMAL LOW
Glucose: 89 mg/dL
Potassium: 3.1 mmol/L — ABNORMAL LOW
SODIUM: 134 mmol/L — AB

## 2014-07-25 LAB — HEPATIC FUNCTION PANEL A (ARMC)
ALK PHOS: 114 U/L
Albumin: 3.4 g/dL — ABNORMAL LOW
BILIRUBIN INDIRECT: 1.7 — AB
BILIRUBIN TOTAL: 2.9 mg/dL — AB
Bilirubin, Direct: 1.2 mg/dL — ABNORMAL HIGH
SGOT(AST): 75 U/L — ABNORMAL HIGH
SGPT (ALT): 89 U/L — ABNORMAL HIGH
TOTAL PROTEIN: 6.4 g/dL — AB

## 2014-07-25 LAB — CBC WITH DIFFERENTIAL/PLATELET
BASOS PCT: 0.6 %
Basophil #: 0 10*3/uL (ref 0.0–0.1)
EOS ABS: 0.1 10*3/uL (ref 0.0–0.7)
EOS PCT: 1.4 %
HCT: 33.8 % — AB (ref 40.0–52.0)
HGB: 10.8 g/dL — AB (ref 13.0–18.0)
Lymphocyte #: 0.8 10*3/uL — ABNORMAL LOW (ref 1.0–3.6)
Lymphocyte %: 17.1 %
MCH: 25.8 pg — AB (ref 26.0–34.0)
MCHC: 31.9 g/dL — AB (ref 32.0–36.0)
MCV: 81 fL (ref 80–100)
MONO ABS: 0.5 x10 3/mm (ref 0.2–1.0)
Monocyte %: 10.5 %
NEUTROS ABS: 3.4 10*3/uL (ref 1.4–6.5)
Neutrophil %: 70.4 %
Platelet: 178 10*3/uL (ref 150–440)
RBC: 4.18 10*6/uL — ABNORMAL LOW (ref 4.40–5.90)
RDW: 21.2 % — ABNORMAL HIGH (ref 11.5–14.5)
WBC: 4.8 10*3/uL (ref 3.8–10.6)

## 2014-07-26 LAB — BASIC METABOLIC PANEL
ANION GAP: 10 (ref 7–16)
BUN: 37 mg/dL — ABNORMAL HIGH
CALCIUM: 8.8 mg/dL — AB
CHLORIDE: 94 mmol/L — AB
CO2: 27 mmol/L
Creatinine: 1.61 mg/dL — ABNORMAL HIGH
EGFR (African American): 50 — ABNORMAL LOW
EGFR (Non-African Amer.): 43 — ABNORMAL LOW
Glucose: 122 mg/dL — ABNORMAL HIGH
Potassium: 4.1 mmol/L
Sodium: 131 mmol/L — ABNORMAL LOW

## 2014-08-02 NOTE — H&P (Signed)
PATIENT NAME:  Nicholas Leonard, Nicholas Leonard MR#:  [id number] DATE OF BIRTH:  01/29/1946  DATE OF ADMISSION:  01/03/2013  PRIMARY CARE PHYSICIAN: Curtis Sites, III, MD   CHIEF COMPLAINT: Edema of the left lower extremity.   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 He has an IVC filter. He was diagnosed with PE in  2007 after he started complaining of paroxysmal nocturnal dyspnea. Again, the patient had an IVC filter, has not had problems since then.  He also has coronary artery disease, hypertension. He has a pacemaker.  He has osteoarthritis, diabetes and his first MI happening at the age of 66. The patient comes today with a history of 2 to 3 days with swelling of the left lower extremity. The patient has swelling all the time. This is something that he has on a chronic basis due to congestive heart failure, but he noticed that his left lower extremity was swollen a little bit more and was starting to get really tight by Sunday. The patient had a normal amount of pain at the level of the calf and tight muscles, and the pain was 2 out of 10, no radiation, no improvement with movement.  The patient knows that his swelling goes away during the night.  In the morning it is very good and then during the day starts to reaccumulate.  For the past 2 days the swelling did not go away whenever he went to sleep. He had some what he describes as "puffiness," and the puffiness went up to the upper leg and groin. The patient denies any shortness of breath, chest pain or any other associated issues. He was evaluated here at the ER where he was found out to have a DVT of the left lower extremity on the left femoral vein and common femoral veins as described.  Popliteal veins were patent. The patient is admitted for treatment of this condition. He has been taking Coumadin 12.5 mg every day, and his INR today is 1, so he is subtherapeutic.  The  patient states that he has been taking his Coumadin every day and has never had any problems before. The patient is admitted for treatment of this condition.   REVIEW OF SYSTEMS:   CONSTITUTIONAL: No fever, fatigue, weight loss or weight gain.  EYES: No double vision, blurred vision.  EARS, NOSE, THROAT: No tinnitus, ear pain or hearing loss.  RESPIRATORY: No cough. No shortness of breath. No orthopnea.  CARDIOVASCULAR: No chest pain. No palpitations or syncope. Positive for chronic edema.  Positive for CHF.   GASTROINTESTINAL: No nausea, vomiting, abdominal pain, hematemesis or melena.  GENITOURINARY: No dysuria, hematuria or changes in frequency.  ENDOCRINOLOGY: No polyuria, polydipsia, polyphagia, cold or heat intolerance. HEMATOLOGIC AND LYMPHATIC: No anemia, easy bruising or bleeding. The patient has chronic anticoagulation with Coumadin.  SKIN: No rashes or petechiae.  MUSCULOSKELETAL: No significant neck pain or back pain. The patient complains of sacral pain for the past week after he has been on his motorized lawnmower at least twice a week for the past several months.  NEUROLOGIC: No numbness, tingling or CVAs.  PSYCHIATRIC: No anxiety or depression.   PAST MEDICAL HISTORY:  1.  Positive PE 5 years ago, positive DVT, positive IVC filter.  2.  Coronary artery disease.  3.  Hypertension.  4.  Hypothyroidism.  5.  Gout.  6.  Osteoarthritis.  7.  Noninsulin-dependent diabetes.  8.  History of MI at the age of 69.  59.  History of AICD implantation due to ejection fraction of less than 20%.  10. The patient does not have a pacemaker. He has AICD.   ALLERGIES: NIACIN AND TAPE.   PAST SURGICAL HISTORY:  1.  Angioplasty at the age of 69, no stent.  2.  AICD placement.   SOCIAL HISTORY: The patient used to smoke. He smoked for at least 18 to 20 years, and he quit at the age of 69 after his heart attack. He states that he drinks 1 or 2 drinks, usually beer, twice a week. He denies  any drugs. He lives with his wife. He is on Disability due to his heart.   FAMILY HISTORY: MI in his dad at the age of 14. No DVT, no cancer in the family.   CURRENT MEDICATIONS: Include warfarin 12.5 mg once daily, Synthroid 50 mcg once a day, potassium chloride 10 mg every other day whenever he takes Lasix, lisinopril 5 mg once a day, Lasix 80 mg every other day, glimepiride 1 mg once a day, colchicine 0.6 mg take 2 tablets once a day, Coreg 12.5 mg twice daily, atorvastatin 80 mg once a day, allopurinol 300 mg twice daily.   PHYSICAL EXAMINATION: VITAL SIGNS: Blood pressure is 100/67, pulse 74, respirations 18, temperature 97.7, oxygen saturation 100% on room air.  GENERAL: The patient is alert, oriented x 3, in no acute distress. No respiratory distress. Hemodynamically stable.  HEENT: Pupils are equal and reactive. Extraocular movements are intact. Mucosa membranes are moist. Anicteric sclerae. Pink conjunctivae. No oral lesions. No oropharyngeal exudates.  NECK: Supple. No JVD. No thyromegaly. No adenopathy. No carotid bruits. No rigidity.  CARDIOVASCULAR: Regular rate and rhythm. No murmurs, rubs or gallops are appreciated. No displacement of PMI.  LUNGS: Clear without any wheezing or crepitus.  No use of accessory muscles. No dullness to percussion.   ABDOMEN: Soft, nontender, nondistended.  No hepatosplenomegaly. No masses. Bowel sounds are positive. No rebound.  GENITOURINARY:  Deferred. EXTREMITIES: Positive edema of the lower extremities, +1 on right side, +2 on the left side. At the left side the edema extends up to middle of the thigh, and it is pitting.  VASCULAR: Pulses +2. Capillary refill less than 3.  SKIN: No rashes, petechiae or new lesions.  MUSCULOSKELETAL: No joint deformity, joint abnormality.   NEUROLOGIC: Cranial nerves II through XII intact. Strength is 5 out of 5 in all 4 extremities.  PSYCHIATRIC: Negative for agitation.  LYMPHATICS: Negative for lymphadenopathy in  the neck or supraclavicular areas.   LABORATORY AND DIAGNOSTIC RESULTS: Ultrasound of lower extremity:  Left side thrombus of the left femoral vein and common femoral veins. Glucose 152, creatinine 1.25, sodium 136, potassium 3.6. LFTs within normal limits. White count is 4.3, hemoglobin is 12, platelet count 179, INR 1. There is no chest x-ray.   ASSESSMENT AND PLAN: This is a nice 69 year old gentleman with multiple medical problems, diabetes, hypertension, CHF systolic with ejection fraction less than 20%, status post AICD with DVTs and PE in the past and is status post an IVC placement, comes with a history of edema worse on the left lower extremity for 2 to 3 days, happened to have a DVT.   1.  Deep vein thrombosis of the left lower extremity:  The patient has an IVC filter. He has been taking Coumadin. Although his Coumadin dose is high at 12.5, he is subtherapeutic  with 1.0 INR. The patient states that he is compliant with his medications, and he takes them every day. He has not had a big change in his diet, and he has not been sick. He has not been taking any new antibiotics or anything that will create some interactions with his INR as far as he can tell me.  The patient is stable. He cannot afford Lovenox, for which we are going to start him on Lovenox 1 mg/kg every 12 hours. The patient is going to continue Coumadin. We are going to give him a first dose of 15 mg and tomorrow continue his home dose of 12.5 and see if that raises his level.  The patient is going to be under telemetry. He does have an IVC filter, and there are no signs of any significant respiratory distress. The patient is a patient of Dr. Graciela HusbandsKlein, and we are going to transfer care to him in the morning. Now we have to work and figure out why his INR is 1 if he states that he is taking his medications.  I think in this patient I doubt that he is compliant with his medications, but I will defer that to Dr. Graciela HusbandsKlein, who knows him much  better than I do.  2.  As far as his congestive heart failure: He has systolic congestive heart failure. His last ejection fraction was less than 20%. He has an AICD. For now, we are going to continue Lasix, replace potassium when needed. No signs of acute exacerbation.  This is compensated CHF.  3.  Hypertension:  Continue treatment with current medications, beta blocker and ACE inhibitor.  4.  Coronary artery disease: The patient has history of MI at the age of 69, is status post angioplasty. The patient is taking a statin, and he will be monitored closely under telemetry. For coronary artery disease, continue beta blocker and ACE inhibitor.  5.  Diabetes:  The patient has well controlled diabetes. Continue glimepiride.  6.  Gout:  Continue  Colcrys  and allopurinol.  7.  Other medical problems seem to be stable.  8.  He is going to be taking Protonix for GI prophylaxis, Lovenox for DVT prophylaxis.  We have to check with his pharmacy in the morning if they will cover his Lovenox so he could be discharged with Lovenox, but for now we will keep him until we know he is safe. If he is ready to be discharged in the morning, we just have to possibly discharge on Lovenox.   TOTAL TIME SPENT:   50 minutes with this patient today.   CODE STATUS:  HE IS A FULL CODE.   ____________________________ Felipa Furnaceoberto Sanchez Gutierrez, MD rsg:cb D: 01/03/2013 16:30:52 ET T: 01/03/2013 16:54:50 ET JOB#: [id number]  cc: Felipa Furnaceoberto Sanchez Gutierrez, MD, <Dictator> Shawntay Prest Juanda ChanceSANCHEZ GUTIERRE MD ELECTRONICALLY SIGNED 01/04/2013 11:10

## 2014-08-02 NOTE — Discharge Summary (Signed)
PATIENT NAME:  Nicholas Leonard, Nicholas Leonard MR#:  [id number] DATE OF BIRTH:  10/03/1945  DATE OF ADMISSION:  01/03/2013 DATE OF DISCHARGE:  01/06/2013   DIAGNOSES AT THE TIME OF DISCHARGE:  1. Recurrent deep vein thrombosis, left lower extremity, with subtherapeutic INR.  2. Systolic congestive heart failure.  3. Hypertension.  4. Coronary artery disease.  5. Type 2 diabetes.  6. History of gout.   CHIEF COMPLAINT: Edema of lower extremity.   HISTORY OF PRESENT ILLNESS: Nicholas Leonard is a 69 year old male with a history of previous DVT and PE, status post IVC filter, who presents complaining of swelling, especially of the left lower extremity. The patient also has been complaining of a sensation of tightness of his calf and left leg muscles associated with puffiness. His INR was subtherapeutic at 1.0, although he was taking Coumadin 12.5 mg a day. The patient states that he has been compliant with his medications.   PAST MEDICAL HISTORY: Significant for: 1. CAD.  2. Hypertension.  3. Hypothyroidism.  4. Gout.  5. Osteoarthritis.  6. Noninsulin-dependent diabetes.  7. History of previous MI.  8. History of AICD placement. 9. Coronary artery disease. He also had angioplasty at age 69 and AICD placement.   PHYSICAL EXAMINATION:  VITAL SIGNS: Blood pressure 100/67, pulse was 74, respirations 18, temperature was 97.7.  GENERAL: He was alert and oriented.  HEENT: Kay, AT.  NECK: Supple.  HEART: S1, S2.  LUNGS: Clear.  ABDOMEN: Soft, nontender.  EXTREMITIES: Evidence of edema noted on the left side which extends from the ankle all the way up to the middle of the thigh and was pitting.  NEUROLOGIC: Nonfocal.   HOSPITAL COURSE: [facility] and placed on IM Lovenox 90 mg subcutaneous q.12 and also given 15 mg of Coumadin. His INR was still subtherapeutic at 1.3, but the patient was keen to go home. Discussions were also held with the patient and family and care managers, and we  were able to get Lovenox for him as an outpatient. The patient states he was unable to afford any of the newer anticoagulant agents such as Xarelto, Pradaxa or Eliquis. His vitamin D level was also low at 129, and he was given 1000 mcg of vitamin B12 and discharged in stable condition on the following medications.   DISCHARGE MEDICATIONS:  1. Allopurinol 300 mg twice a day.  2. Synthroid 50 mcg once a day. 3. Colchicine 0.6 mg 2 tablets daily p.r.n. 4. Atorvastatin 80 mg at bedtime.  5. Lisinopril 5 mg once a day. 6. Coumadin 15 mg a day. 7. Enoxaparin 90 mg subcutaneous q.12.  8. Glimepiride 1 mg once a day.  9. KCl 1 tablet 10 mEq once a day. 10. Cyanocobalamin 500 mcg b.i.d.  11. Carvedilol 12.5 mg twice a day.  12. Furosemide 80 mg once a day.   FOLLOWUP: The patient has been advised to have a PT/INR checked on 01/08/2013 and follow up with Dr. Graciela HusbandsKlein early next week.    ____________________________ Barbette ReichmannVishwanath Norlene Lanes, MD vh:OSi D: 01/06/2013 13:51:54 ET T: 01/06/2013 14:15:40 ET JOB#: [id number]  cc: Barbette ReichmannVishwanath Kambry Takacs, MD, <Dictator> Barbette ReichmannVISHWANATH Tyjay Galindo MD ELECTRONICALLY SIGNED 01/15/2013 18:29

## 2014-08-03 NOTE — H&P (Signed)
PATIENT NAME:  Nicholas Leonard, Rc E MR#:  [id number] DATE OF BIRTH:  [birth year]  DATE OF ADMISSION:  09/02/2013  PRIMARY CARE PHYSICIAN: Lynnea FerrierBert J. Klein III, MD  CARDIOLOGIST: Marcina MillardAlexander Paraschos, MD  CHIEF COMPLAINT: Chest pain today and firing of AICD this morning.   [name] is a pleasant 69 year old Caucasian gentleman with history of severe ischemic cardiomyopathy with EF of less than 20%, status post AICD placed in the past and a history of MI at age 69 with history of diabetes, comes to the Emergency Room after he started  experiencing chest pain episode this morning radiating to the left arm while he was taking a shower. The patient got out of shower, laid down and continued to not feel well, and all of a sudden wife heard the patient moaning and realized at that time AICD had fired/shocked him. The patient came to the Emergency Room, remained hemodynamically stable other than his systolic blood pressure down in the 90s. He denies any chest pain or shortness of breath. He had recent abnormal stress test and was scheduled to get outpatient cardiac cath on the coming Tuesday. Given his symptoms and abnormal stress test along with AICD firing/shock, we will admit the patient with unstable angina.   In the Emergency Room, [employer] rep interrogated his AICD, which is appropriately working. The patient had 2 episodes of VT/V. fib with heart rate in the 180s and had shocked him twice this morning.   PAST MEDICAL HISTORY: 1.  PE and DVT. The patient has IVC filter placed, and he is on Coumadin for anticoagulation.  2.  CAD.  3.  Hypertension.  4.  Hypothyroidism.  5.  History of gout.  6.  Osteoarthritis.  7.  NIDDM.  8.  History of MI at age 69.  649.  Severe ischemic cardiomyopathy, EF less than 20%, status post AICD placement.   PAST SURGICAL HISTORY:  1.  Angioplasty at age 69.  2.  AICD placement.   ALLERGIES: NIACIN AND TAPE.   SOCIAL HISTORY: The patient used to smoke, smoked for  about 18 to 20 years. He quit at age 69. Lives with his wife. Is on disability due to his heart condition. He drinks occasionally 1 or 2 drinks a week, which is in the form of beer.   FAMILY HISTORY: MI in his father at age 70.   REVIEW OF SYSTEMS:    CONSTITUTIONAL: No fever, fatigue, weakness.  EYES: No blurred or double vision, glaucoma or cataracts.  EARS, NOSE, THROAT: No tinnitus, ear pain, hearing loss or snoring.  RESPIRATORY: No cough, wheeze, hemoptysis, dyspnea or COPD.  CARDIOVASCULAR: Positive for chest pain earlier. Positive for arrhythmia. No palpitations or syncope.  GASTROINTESTINAL: No nausea, vomiting, diarrhea, abdominal pain, GERD or melena.  GENITOURINARY: No dysuria, hematuria or frequency.  ENDOCRINE: No polyuria, nocturia or thyroid problems.  HEMATOLOGIC: No anemia or easy bruising or bleeding.  SKIN: No acne, rash or lesions.  MUSCULOSKELETAL: Positive for arthritis. No swelling or gout.  NEUROLOGIC: No CVA, TIA, or weakness.  PSYCHIATRIC: No anxiety, depression or bipolar disorder.   All other systems reviewed and negative.   PHYSICAL EXAMINATION: GENERAL: The patient is awake, alert, oriented x 3, not in acute distress.  VITAL SIGNS: Afebrile. Pulse is 64, sinus. Respirations 20. Blood pressure is 96/67. Sats are 100% on 1 liter. HEENT: Atraumatic, normocephalic. Pupils: PERRLA. EOM intact. Oral mucosa is moist.  NECK: Supple. No JVD. No carotid bruits  RESPIRATORY: Clear to auscultation bilaterally. No rales,  rhonchi, respiratory distress or labored breathing.  CARDIOVASCULAR: Both the heart sounds are normal. Rate, rhythm regular. PMI not lateralized. Chest nontender. Good pedal pulses. Good femoral pulses. No lower extremity edema.  ABDOMEN: Soft, benign, nontender. No organomegaly. Positive bowel sounds.  NEUROLOGIC: Grossly intact cranial nerves II through XII. No motor or sensory deficits.  PSYCHIATRIC: The patient is awake, alert, oriented x 3.    LABORATORY, DIAGNOSTIC, AND RADIOLOGICAL DATA: EKG shows normal sinus rhythm, LAD, Q waves which appear old in inferior lead. Chest x-ray: Progression of cardiomegaly. No acute cardiopulmonary process. Troponin is less than 0.2. Hemoglobin and hematocrit are 11.9 and 37.4; platelet count is 132; white count is 3.1. Basic metabolic panel within normal limits, except glucose of 152. PT and INR are 15.3 and 1.2.   ASSESSMENT AND PLAN: A 69 year old Mr. [name] with history of severe ischemic cardiomyopathy, ejection fraction of less than 20%, status post automatic implantable cardiac defibrillator placement, along with history of coronary artery disease, pulmonary embolism, hypothyroidism, and diabetes, comes to the Emergency Room with:  1.  Unstable angina: The patient did have chest pain radiating to the left arm this morning. He recently had an abnormal Myoview stress test, which was done as outpatient. He is scheduled to get cardiac catheterization on Tuesday. We will admit the patient, continue nitroglycerin, start him on IV heparin drip, aspirin, and continue his home cardiac meds. I spoke with Dr. Lady Gary who will see the patient in consultation. Cycle cardiac enzymes x 3.  2.  Ventricular tachycardia/ventricular fibrillation, status post automatic implantable cardiac defibrillator firing this morning x 2: The patient's automatic implantable cardiac defibrillator was interrogated. He did have ventricular tachycardia/ventricular fibrillation this morning with heart rate up to 180s. The automatic implantable cardiac defibrillator device is working well.  3.  Chronic congestive heart failure, systolic, with ejection fraction of less than 20%: The patient does not appear to be in congestive heart failure. We are going to continue his Lasix and potassium.  4.  Coronary artery disease, history of myocardial infarction in the past: Will continue beta blockers, ACE inhibitors, and aspirin.  5.  Type 2 diabetes:  Continue glimepiride and sliding scale insulin.  6.  Deep vein thrombosis prophylaxis: The patient is already on heparin drip.   The above was discussed with Dr. Lady Gary, the patient, and the patient's wife who was present in the Emergency Room. Further work-up according to the patient's clinical course.   TIME SPENT: 50 minutes.    ____________________________ Wylie Hail Allena Katz, MD sap:jcm D: 09/02/2013 15:18:00 ET T: 09/02/2013 17:06:09 ET JOB#: [id number]  cc: Rhylee Nunn A. Allena Katz, MD, <Dictator>  Willow Ora MD ELECTRONICALLY SIGNED 09/03/2013 14:36

## 2014-08-03 NOTE — Discharge Summary (Signed)
PATIENT NAME:  Nicholas LedgerRKER, Nicholas Leonard#:  [id number] DATE OF BIRTH:  10-27-1945  DATE OF ADMISSION:  09/02/2013 DATE OF DISCHARGE:  09/05/2013  FINAL DIAGNOSES: 1.  Ventricular tachycardia and fibrillation.  2.  Coronary artery disease.  3.  Chronic left-sided systolic congestive heart failure.  4.  History of deep vein thrombosis and questionable compliance with Coumadin.  5.  Hypothyroidism.  6.  Gout.  7.  Osteoarthritis.  8.  Adult onset diabetes mellitus.  9.  Coronary artery disease.   PRINCIPLE PROCEDURES:  Cardiac catheterization performed on 09/04/2013, revealed severe dilated cardiomyopathy with a 70% proximal LAD lesion, 80% mid LAD lesion, 75% distal LAD stenosis, with 70% diffuse proximal circumflex stenosis, 85% tubular stenosis of OM1, 100% occlusion of the proximal RCA with 80% tubular stenosis of RV marginal one.  Medical management recommended.   HISTORY AND PHYSICAL:  Please see dictated admission history and physical.   HOSPITAL COURSE:  The patient was admitted after his AICD had discharged; interrogation revealed with this occurred at least twice and evidence of ventricular tachycardia and fibrillation was noted.  Cardiology saw the patient and he underwent cardiac catheterization with results as noted above.  He was felt to require medical management due to the diffuse nature of his coronary artery lesions, and cardiology discussed the case with [facility] cardiology to determine further treatment options.  He was started on amiodarone which he tolerated well.  He was ambulating without significant difficulty, with no further chest discomfort or evidence of discharging of the defibrillator.  At this time the plan is for him to return home with close follow-up with cardiology with further intervention to be assessed through that office.  He should weigh himself daily, calling for more than 2 pound gain in one day or 5 pounds in one week or increasing signs or symptoms of heart failure.   He should check his sugars daily and record this.  He will need to monitor his heart rate and blood pressures well and the potential side effects and medication interactions with amiodarone were discussed with the patient.  He will follow a 2 gram sodium 1800 calorie diet.  He will follow up with Dr. Darrold JunkerParaschos within the next one week and with our office within 1 to 2 weeks.   DISCHARGE MEDICATIONS: 1.  Atorvastatin 80 mg by mouth at bedtime.  2.  Calcium 1000 mg by mouth q. 4 hours as needed for indigestion or heartburn.  3.  Glimepiride 1 mg by mouth daily.  4.  Carvedilol 12.5 mg by mouth twice daily.  5.  Allopurinol 300 mg by mouth daily.  6.  Lasix 160 mg by mouth daily.  7.  Potassium 20 mEq by mouth daily.  8.  Levothyroxine 0.05 mg by mouth daily.  9.  Lisinopril 2.5 mg by mouth daily, dose reduced.  10.  Coumadin 10 mg by mouth daily, dose reduced.  11.  Colchicine 0.6 mg by mouth daily as needed for gout.  12.  Amiodarone 400 mg by mouth twice daily x 1 week, then by mouth daily.  13.  Aspirin 81 mg by mouth daily, which may be discontinued once his INR is elevated above 2.     ____________________________ Nicholas FerrierBert J. Klein III, MD bjk:ea D: 09/05/2013 06:58:29 ET T: 09/05/2013 07:22:34 ET JOB#: [id number]  cc: Nicholas FerrierBert J. Klein III, MD, <Dictator> Marcina MillardAlexander Paraschos, MD Nicholas FerrierBert J. Klein III, MD Daniel NonesBERT KLEIN MD ELECTRONICALLY SIGNED 09/05/2013 13:28

## 2014-08-03 NOTE — Consult Note (Signed)
   Present Illness 69 yo male with history of ischemic cardiomyopathy with ef of 16%, history of aicd placement plaed in 2010 per [facility] He was evaluated as outptient for chest pain and had an abnormal funcitonal study with evidence of fixed defects and ischemia in the lateral walls. He was scheduled for cardiac cath 5/26. He now presents to the er with complaints of aicd shock. Interogation of the device suggested several shocks for vt/vf with resumption of nsr. This is the second time he has had a treatment. Most recent previous was in april of this year. He has ruled out for mi and has had no further shocks.   Physical Exam:  GEN well developed, well nourished, no acute distress   HEENT PERRL, hearing intact to voice   NECK supple   RESP normal resp effort   CARD Regular rate and rhythm  Normal, S1, S2   ABD denies tenderness  no Adominal Mass   LYMPH negative neck   EXTR negative cyanosis/clubbing, negative edema   SKIN normal to palpation   NEURO cranial nerves intact, motor/sensory function intact   PSYCH A+O to time, place, person   Review of Systems:  Subjective/Chief Complaint my aicd fired.   General: No Complaints   Skin: No Complaints   ENT: No Complaints   Eyes: No Complaints   Neck: No Complaints   Respiratory: No Complaints   Cardiovascular: No Complaints   Gastrointestinal: No Complaints   Genitourinary: No Complaints   Vascular: No Complaints   Musculoskeletal: No Complaints   Neurologic: No Complaints   Hematologic: No Complaints   Endocrine: No Complaints   Psychiatric: No Complaints   Review of Systems: All other systems were reviewed and found to be negative   Medications/Allergies Reviewed Medications/Allergies reviewed   Family & Social History:  Family and Social History:  Family History Non-Contributory   Social History negative tobacco   Place of Living Home   EKG:  EKG NSR   Abnormal NSSTTW  changes    Niaspan ER: Other  Tape: Other   Impression 69 yo male with ischemic cardiomyopahty with ef of 16% s/p v/vf event treated successfully with his aicd. Hs ruled out for mi. Was scheduled for cardiac cath. WIll proceed iwth cath in am due to vt/vf and abnormal funcitonal study revealing lateral ischemia   Plan 1. Conitnue current meds including heparin 2. Cardiac cath in am per Dr. Darrold JunkerParaschos 3. Follow for arrythmia 4. Further recs after cath   Electronic Signatures: Dalia HeadingFath, Rahkim Rabalais A (MD)  (Signed 25-May-15 10:29)  Authored: General Aspect/Present Illness, History and Physical Exam, Review of System, Family & Social History, EKG , Allergies, Impression/Plan   Last Updated: 25-May-15 10:29 by Dalia HeadingFath, Daylen Hack A (MD)

## 2014-08-11 NOTE — Consult Note (Signed)
PATIENT NAME:  Nicholas Leonard, Nicholas Leonard MR#:  [id number] DATE OF BIRTH:  [birth year]  DATE OF CONSULTATION:  06/29/2014  REFERRING PHYSICIAN:   CONSULTING PHYSICIAN:  Dwayne D. Callwood, MD  INDICATIONS: Congestive heart failure, edema.  HISTORY OF PRESENT ILLNESS: The patient is a 69 year old male who presented with chronic left-sided systolic heart failure, coronary artery disease, history of pulmonary embolus, diabetes, hypertension, defibrillator placement, who was hospitalized in February with acute renal failure and severe hypothyroidism which appeared secondary to medical noncompliance. Patient has a history of varying Coumadin levels as well. The patient has poor compliance.  Complicated history over the last week.  Was actually seen in the Emergency Room for heart failure and leg edema.  Was seen by a nephrologist. Advised to increase Lasix and hold Zaroxolyn. The patient may not have taken Zaroxolyn. He had trouble getting to the pharmacy. The patient was seen in the office by endocrinologist.  Patient did not fill some of his medications.  Had worsening heart failure. Came to the Emergency Room and was advised to be admitted with systolic blood pressure in the 80s and some altered mental status and hypotension.   PAST MEDICAL HISTORY: Hypothyroidism, chronic renal insufficiency, congestive heart failure, systolic dysfunction, coronary artery disease, history of pulmonary embolus, diabetes, osteoarthritis, hypertension, ischemic cardiomyopathy with defibrillator placement, coronary artery bypass surgery, gout.   ALLERGIES: NIASPAN.   MEDICATIONS:  Allopurinol 100 mg a day, amiodarone 200 a day, Lipitor 80 a day, Coreg 3.125 twice a day, Colcrys 0.6 as needed for gout, Vitamin D, Lasix 80 mg 1 mg daily, glimepiride 1 mg daily, levothyroxine 0.125 daily, potassium 20 mEq with Lasix, Coumadin 10 mg a day.   SOCIAL HISTORY: Tobacco remote, occasional alcohol abuse  FAMILY HISTORY: Coronary artery  disease.   REVIEW OF SYSTEMS: Denies blackout spells or syncope. No nausea or vomiting. No fever, no chills, no sweats. No weight loss. No weight gain.  Hemoptysis, hematemesis, denies bright red blood per rectum.  Denies vision change or hearing change. Denies sputum production or cough. He has had leg edema, shortness of breath, congestion.  PHYSICAL EXAMINATION:  VITAL SIGNS: Blood pressure was systolic in the 80s over 50, heart rate of 60, respiratory rate 14.  HEENT:  Normocephalic and atraumatic.  Pupils equal and reactive to light.   NECK EXAMINATION: Supple. No bilateral JVD.  LUNG EXAMINATION: Bilateral rhonchi, rales in the bases.  HEART EXAMINATION: Regular rate and rhythm. Systolic ejection murmur left sternal border. Positive S3.  ABDOMINAL EXAMINATION: Benign.  EXTREMITY EXAMINATION: With 2-3+ edema.  NEUROLOGIC EXAMINATION: Intact.  SKIN EXAMINATION: Normal.   LABORATORY DATA: Still pending.  ASSESSMENT: Acute on chronic systolic heart failure, leg edema, hypothyroidism, diabetes, gout, noncompliant, mild obesity.  PLAN:  1.  Agree with Lasix therapy. Agree with Coumadin for atrial fibrillation. Consult nephrology for renal insufficiency. Echocardiogram may be helpful.  Will continue to diurese the patient accordingly. Will also recheck TSH and resume levothyroxine.  2.  Continue diabetes management and control with sliding scale insulin as well. Continue gout therapy. Continue atrial fibrillation therapy with amiodarone, rate control as necessary. The patient appeared not to have any chest pain with a known history of coronary artery disease.  Also defibrillator placement.  I would not discharge.  Continue aggressive medical therapy involving nephrology as well as heart failure therapy with atrial fibrillation therapy as well.  Compliance is a major issue in this case. Will continue to follow the patient.   ____________________________ Bobbie Stack. Callwood,  MD ddc:sp D: 06/30/2014 10:12:02 ET T: 06/30/2014 10:47:42 ET JOB#: [id number]  cc: Dwayne D. Juliann Paresallwood, MD, <Dictator> Alwyn PeaWAYNE D CALLWOOD MD ELECTRONICALLY SIGNED 07/09/2014 9:05

## 2014-08-11 NOTE — Discharge Summary (Signed)
PATIENT NAME:  Nicholas Leonard, Merlen E MR#:  [id number] DATE OF BIRTH:  [birth year]  DATE OF ADMISSION:  06/28/2014 DATE OF DISCHARGE:  07/02/2014  FINAL DIAGNOSES: 1.  Acute on chronic left-sided systolic congestive heart failure.  2.  Acute on chronic renal failure.  3.  Coronary artery disease.  4.  Ischemic cardiomyopathy.  5.  History of ventricular fibrillation, status post AICD implantation, on chronic amiodarone therapy.  6.  History of atrial fibrillation, on chronic Coumadin therapy.  7.  Hypothyroidism.  8.  History of venous stasis ulcer.  9.  History of pulmonary embolism with deep venous thrombosis, chronic Coumadin therapy as noted above.  10.  Osteoarthritis.  11.  Gout.   HISTORY AND PHYSICAL: Please see dictated admission history and physical.   SUMMARY OF HOSPITAL COURSE: The patient was admitted with worsening and generalized fluid retention and anasarca secondary to worsening heart failure. He was initiated on IV Lasix and had good response to this. Electrolytes were replaced. Nephrology and cardiology saw the patient. Echocardiogram was repeated, which revealed severe left-sided systolic heart failure, similar to previous. Cardiac enzymes negative. His blood pressure ran low throughout his hospitalization and so Coreg was held. He was not a candidate for ACE inhibitors, angiotensin receptor blockers or aldosterone secondary to hypotension.   He was converted over to oral torsemide, and continued to have reasonable urine output with this, with overall diuresis of over 2 liters. His renal function appeared roughly stable. He ambulated with physical therapy, and did well, and they felt that he could walk with a rolling walker and did not need further physical therapy assistance. His oxygen saturations stayed stable. His blood sugar medications were held during this hospitalization secondary to decreased oral intake, his blood sugars began to rise as his oral intake did increase. His  medications from home will be restarted. He was seen by the heart failure clinic and they will see him as an outpatient.   He was noted to have a venous stasis ulcer on his right leg, present on admission. This did not have any evidence of significant infection, but will likely need assistance for healing, and so he was set up for outpatient wound clinic follow-up as well.   He was advised to weigh himself every day, calling for more than 2 pound gain in one day or 5 pounds in one week or increasing signs or symptoms of heart failure, with which he is well familiar. He should follow a 2 gram sodium diet. He should check his blood sugar daily and record this. He should be up with a walker as tolerated. He will follow up in our office in the next 1 to 2 weeks, following up with the heart failure clinic and with nephrology as planned as well as cardiology as planned.   DISCHARGE MEDICATIONS: 1.  Glimepiride 1 mg p.o. daily.  2.  Amiodarone 200 mg p.o. daily. 3.  Allopurinol 100 mg p.o. daily.  4.  Potassium 20 mEq p.o. daily.  5.  Lipitor 80 mg p.o. at bedtime.  6.  Vitamin D 50,000 units p.o. weekly.  7.  Coumadin 8 mg p.o. daily.  8.  Colcrys 0.6 mg p.o. daily as needed for gout.  9.  Levothyroxine 0.137 mg p.o. daily, dose increased.  10.  Torsemide 40 mg p.o. b.i.d.   He will hold carvedilol at this time secondary to hypotension. He will hold furosemide as this has been replaced with torsemide, and this instruction was given to the  patient.   The absolute importance of taking his medications every single day was again stressed to the patient. He should contact us if he has any issues with getting his medications so that we can hopefully prevent further hospitalizations.   ____________________________ Lynnea Ferrier, MD bjk:sb D: 07/02/2014 07:59:06 ET T: 07/02/2014 09:57:23 ET JOB#: [id number]  cc: Lynnea Ferrier, MD, <Dictator> Daniel Nones MD ELECTRONICALLY SIGNED 07/04/2014 8:28

## 2014-08-11 NOTE — Consult Note (Signed)
Chief Complaint:  Subjective/Chief Complaint Pt feels better. LFT slowly improving. Hep A/B/C neg. Other liver serologies sent.   VITAL SIGNS/ANCILLARY NOTES: **Vital Signs.:   01-Feb-16 13:12  Vital Signs Type Routine  Temperature Temperature (F) 96.3  Celsius 35.7  Pulse Pulse 80  Respirations Respirations 19  Systolic BP Systolic BP 110  Diastolic BP (mmHg) Diastolic BP (mmHg) 80  Mean BP 91  Pulse Ox % Pulse Ox % 97  Pulse Ox Activity Level  At rest  Oxygen Delivery Room Air/ 21 %   Brief Assessment:  GEN no acute distress   Cardiac Regular   Respiratory clear BS   Gastrointestinal Normal   Lab Results: Thyroid:  01-Feb-16 04:29   Thyroid Stimulating Hormone  47.5 (0.45-4.50 (IU = International Unit)  ----------------------- Pregnant patients have  different reference  ranges for TSH:  - - - - - - - - - -  Pregnant, first trimetser:  0.36 - 2.50 uIU/mL)  Hepatic:  01-Feb-16 04:29   Bilirubin, Total  1.8  Alkaline Phosphatase  137  SGPT (ALT)  206  SGOT (AST)  252  Total Protein, Serum  6.3  Albumin, Serum  3.2  Routine Chem:  01-Feb-16 04:29   Glucose, Serum  104  BUN  38  Creatinine (comp)  2.03  Sodium, Serum 136  Potassium, Serum 3.7  Chloride, Serum 102  CO2, Serum 25  Calcium (Total), Serum 8.7  Osmolality (calc) 281  eGFR (African American)  42  eGFR (Non-African American)  35 (eGFR values <62m/min/1.73 m2 may be an indication of chronic kidney disease (CKD). Calculated eGFR, using the MRDR Study equation, is useful in  patients with stable renal function. The eGFR calculation will not be reliable in acutely ill patients when serum creatinine is changing rapidly. It is not useful in patients on dialysis. The eGFR calculation may not be applicable to patients at the low and high extremes of body sizes, pregnant women, and vegetarians.)  Anion Gap 9  Routine Coag:  01-Feb-16 04:29   Prothrombin  28.5 (11.4-15.0 NOTE: New Reference  Range  05/10/14)  INR 2.7 (INR reference interval applies to patients on anticoagulant therapy. A single INR therapeutic range for coumarins is not optimal for all indications; however, the suggested range for most indications is 2.0 - 3.0. Exceptions to the INR Reference Range may include: Prosthetic heart valves, acute myocardial infarction, prevention of myocardial infarction, and combinations of aspirin and anticoagulant. The need for a higher or lower target INR must be assessed individually. Reference: The Pharmacology and Management of the Vitamin K  antagonists: the seventh ACCP Conference on Antithrombotic and Thrombolytic Therapy. CRPRXY.5859Sept:126 (3suppl): [id number] A HCT value >55% may artifactually increase the PT.  In one study,  the increase was an average of 25%. Reference:  "Effect on Routine and Special Coagulation Testing Values of Citrate Anticoagulant Adjustment in Patients with High HCT Values." American Journal of Clinical Pathology 2006;126:400-405.)  Routine Hem:  01-Feb-16 04:29   WBC (CBC)  3.6  RBC (CBC) 4.42  Hemoglobin (CBC)  12.0  Hematocrit (CBC)  37.7  Platelet Count (CBC)  135  MCV 85  MCH 27.2  MCHC  31.9  RDW  18.3  Neutrophil % 49.8  Lymphocyte % 36.0  Monocyte % 9.8  Eosinophil % 2.8  Basophil % 1.6  Neutrophil # 1.8  Lymphocyte # 1.3  Monocyte # 0.4  Eosinophil # 0.1  Basophil # 0.1 (Result(s) reported on 13 May 2014 at 04:54AM.)   Assessment/Plan:  Assessment/Plan:  Assessment LFT elevation. Slowly improving. Thyroid function being corrected. CHF being controlled. Cholesterol medicine placed on hold.   Plan Continue to moniter LFT. Expect levels to continue to improve over next few days. I will be in [place] tomorrow. Will check back on Wed if patient still here. thanks.   Electronic Signatures: Verdie Shire (MD)  (Signed 01-Feb-16 13:41)  Authored: Chief Complaint, VITAL SIGNS/ANCILLARY NOTES, Brief Assessment, Lab Results,  Assessment/Plan   Last Updated: 01-Feb-16 13:41 by Verdie Shire (MD)

## 2014-08-11 NOTE — Consult Note (Signed)
Chief Complaint:  Subjective/Chief Complaint Shortness of breath improves as well as leg edema patient feels much better and is ready to be discharged home today and follow-up with Cardiology   VITAL SIGNS/ANCILLARY NOTES: **Vital Signs.:   22-Mar-16 09:16  Vital Signs Type Routine  Temperature Temperature (F) 98  Celsius 36.6  Temperature Source oral  Pulse Pulse 86  Respirations Respirations 18  Systolic BP Systolic BP 825  Diastolic BP (mmHg) Diastolic BP (mmHg) 86  Mean BP 96  Pulse Ox % Pulse Ox % 98  Pulse Ox Activity Level  At rest  Oxygen Delivery Room Air/ 21 %  *Intake and Output.:   Daily 22-Mar-16 07:00  Grand Totals Intake:  600 Output:  600    Net:  0 24 Hr.:  0  Oral Intake      In:  600  Urine ml     Out:  600  Length of Stay Totals Intake:  1820 Output:  0539    Net:  -1955   Brief Assessment:  GEN well developed, well nourished, no acute distress   Cardiac Irregular  murmur present  + JVD  --Gallop   Respiratory normal resp effort  clear BS  rhonchi  crackles   Gastrointestinal Normal   Gastrointestinal details normal Soft  Nontender  Nondistended   EXTR negative cyanosis/clubbing, positive edema, 3+   Lab Results: Routine Chem:  22-Mar-16 05:03   Glucose, Serum  141 (65-99 NOTE: New Reference Range  06/04/14)  BUN  32 (6-20 NOTE: New Reference Range  06/04/14)  Creatinine (comp)  1.48 (0.61-1.24 NOTE: New Reference Range  06/04/14)  Sodium, Serum  134 (135-145 NOTE: New Reference Range  06/04/14)  Potassium, Serum 3.6 (3.5-5.1 NOTE: New Reference Range  06/04/14)  Chloride, Serum 101 (101-111 NOTE: New Reference Range  06/04/14)  CO2, Serum 27 (22-32 NOTE: New Reference Range  06/04/14)  Calcium (Total), Serum  8.5 (8.9-10.3 NOTE: New Reference Range  06/04/14)  Anion Gap  6  eGFR (African American)  55  eGFR (Non-African American)  48 (eGFR values <106m/min/1.73 m2 may be an indication of chronic kidney disease  (CKD). Calculated eGFR is useful in patients with stable renal function. The eGFR calculation will not be reliable in acutely ill patients when serum creatinine is changing rapidly. It is not useful in patients on dialysis. The eGFR calculation may not be applicable to patients at the low and high extremes of body sizes, pregnant women, and vegetarians.)   Radiology Results: XRay:    18-Mar-16 19:13, Chest PA and Lateral  Chest PA and Lateral   REASON FOR EXAM:    dyspnea  COMMENTS:       PROCEDURE: DXR - DXR CHEST PA (OR AP) AND LATERAL  - Jun 28 2014  7:13PM     CLINICAL DATA:  Acute shortness of breath and extremity swelling.    EXAM:  CHEST  2 VIEW    COMPARISON:  05/08/2014 and prior chest radiographs    FINDINGS:  Cardiomegaly, cardiac valve replacement and CABG changes, and right  sided AICD again noted.  A small right pleural effusion and slightly increased right basilar  atelectasis again noted.    There is no evidence of pulmonary edema or pneumothorax.    No acute bony abnormalities are identified.     IMPRESSION:  Slightly increased right basilar atelectasis with continued small  right pleural effusion.    Cardiomegaly and cardiac valve replacement/CABG changes.      Electronically Signed  By: Margarette Canada M.D.    On: 06/28/2014 20:29         Verified By: Lura Em, M.D.,  Cardiology:    18-Mar-16 18:35, Echo Doppler  Echo Doppler   REASON FOR EXAM:      COMMENTS:       PROCEDURE: [facility] - ECHO DOPPLER COMPLETE(TRANSTHOR)  - Jun 28 2014  6:35PM     RESULT: Echocardiogram Report    Patient Name:   DAKHARI ZUVER Date of Exam: 06/28/2014  Medical Rec #:  [id number]           Custom1:  Date of Birth:  [birth year]       Height:       72.0 in  Patient Age:    69 years         Weight:       217.0 lb  Patient Gender: M                BSA:          2.21 m??    Indications: CHF  Sonographer:    Arville Go RDCS  Referring Phys: [facility]on[facility]    Sonographer Comments: Suboptimal subcostal window.    Summary:   1. Left ventricular ejection fraction, by visual estimation, is 20 to   25%.   2. Severely decreased global left ventricular systolic function.   3. Severely increased left ventricular internal cavity size.   4. Moderately enlarged right ventricle.   5. Moderately reduced RV systolic function.   6. Moderately dilated left atrium.   7. Moderately dilated right atrium.   8. Degenerative mitral valve.   9. Mild to moderate mitral valve regurgitation.  10. Moderate thickening and calcification of the anterior and posterior   mitral valve leaflets.  11. Moderate tricuspid regurgitation.  12. Mildly increased left ventricular posterior wall thickness.  13. Dilated cardiomyopathy.  2D AND M-MODE MEASUREMENTS (normal ranges within parentheses):  Left Ventricle:          Normal  IVSd (2D):      1.25 cm (0.7-1.1)  LVPWd (2D):     1.22 cm (0.7-1.1) Aorta/LA:                  Normal  LVIDd (2D):     6.52 cm (3.4-5.7) Aortic Root (2D): 3.10 cm (2.4-3.7)  LVIDs (2D):     5.86 cm           Left Atrium (2D): 5.50 cm (1.9-4.0)  LV FS (2D):     10.1 %   (>25%)  LV EF (2D):     21.6 %   (>50%)                                    Right Ventricle:                                    RVd (2D):  LV DIASTOLIC FUNCTION:  MV Peak E: 1.05 m/s  SPECTRAL DOPPLER ANALYSIS (where applicable):  Mitral Valve:  MV Max Vel:   1.29 m/s MV P1/2 Time: 65.00 msec  MV Mean Grad: 3.5 mmHg MV Area, PHT: 3.38 cm??  Aortic Valve: AoV Max Vel: 1.06 m/s AoV Peak PG: 4.5 mmHg AoV Mean PG:  LVOT Vmax:  LVOT VTI:  LVOT Diameter: 2.60 cm  Aortic Insufficiency:  AI Half-time:  721 msec  AI Decel Rate: 1.18 m/s??  Tricuspid Valve and PA/RV Systolic Pressure: TR Max Velocity: 2.08 m/s RA   Pressure: 5 mmHg RVSP/PASP: 22.3 mmHg    PHYSICIAN INTERPRETATION:  Left Ventricle: The left ventricular internal cavity size was severely   increased. LV septal wall  thickness was normal. LV posterior wall   thickness was mildly increased. Global LV systolic function was severely   decreased. Left ventricular ejection fraction, by visual estimation, is   20 to 25%. Findings are consistent with dilated cardiomyopathy.  Right Ventricle: The right ventricular size is moderately enlarged.   Global RV systolic function is moderately reduced.  Left Atrium: The left atrium is moderately dilated.  Right Atrium: The right atrium is moderately dilated.  Pericardium: There is no evidence of pericardial effusion.  Mitral Valve: The mitral valve is degenerative in appearance. There is   moderate thickening and calcification of the anterior and posterior   mitral valve leaflets. Mild to moderate mitral valve regurgitation is   seen.  Tricuspid Valve: The tricuspid valve is normal. Moderate tricuspid     regurgitation is visualized. The tricuspid regurgitant velocity is 2.08   m/s, and with an assumed right atrial pressure of 5 mmHg, the estimated   right ventricular systolic pressure is normal at 22.3 mmHg.  Aortic Valve: The aortic valve is tricuspid.  Pulmonic Valve: The pulmonic valve is normal. No indication of pulmonic   valve regurgitation.  Additional Comments: A pacer wire is visualized.    [place] MD  Electronically signed by [place] MD  Signature Date/Time: 06/29/2014/12:54:21 PM    *** Final ***    IMPRESSION: .    Verified By: Yolonda Kida, M.D., MD   Assessment/Plan:  Assessment/Plan:  Assessment IMP  acute on chronic renal insufficiency  congestive heart failure  cardiomyopathy  AICD  leg edema  hypothyroidism  shortness of breath  weakness  coronary artery disease  hypertension  noncompliant .   Plan agree with discharge home with follow-up with Cardiology as an outpatient agree with Nephrology input for renal insufficiency continue Lasix therapy  supplemental oxygen  levothyroxine for thyroid  disease  support stockings for edema  DVT prophylaxis  blood pressure control  aspirin for arteriosclerotic vascular disease  consider physical therapy  recommend dietary discretion reduce in salt intake   Electronic Signatures: Lujean Amel D (MD)  (Signed 22-Mar-16 13:05)  Authored: Chief Complaint, VITAL SIGNS/ANCILLARY NOTES, Brief Assessment, Lab Results, Radiology Results, Assessment/Plan   Last Updated: 22-Mar-16 13:05 by Lujean Amel D (MD)

## 2014-08-11 NOTE — Consult Note (Signed)
Pt seen and examined. Full consult to follow. Pt with multiple medical problems, incl CAD, HTN, DM, CHF, Hx of DVT/PE, and now with renal failure. Main complaint is malaise and fatigue. No GI complaints. LFT rising, which is new. LFT in 9/14 was normal. Liver looks normal on U/S. However, patient has many potential causes of liver abnormality, incl several years of amiodarone use, many years of lipitor use, possible liver congestion from CHF, hypothyroidism, etc. Quit alcohol few years ago. No prior hx of viral hepatitis. For now, correct heart condition and adjust thyroid meds and see if LFT improves or not. Meanwhile, will order hepatitis panel and liver serologies. Hold any potential hepatotoxic meds. Will follow. Thanks.  Electronic Signatures: Lutricia Feilh, Brayden Brodhead (MD)  (Signed on 31-Jan-16 10:25)  Authored  Last Updated: 31-Jan-16 10:25 by Lutricia Feilh, Maddix Kliewer (MD)

## 2014-08-11 NOTE — Consult Note (Signed)
Chief Complaint:  Subjective/Chief Complaint Improved leg edema states he is less short of breath sleeping comfortably seems to be much improve since admission.   VITAL SIGNS/ANCILLARY NOTES: **Vital Signs.:   21-Mar-16 07:40  Vital Signs Type Q 4hr  Temperature Temperature (F) 97.5  Celsius 36.3  Temperature Source oral  Pulse Pulse 83  Respirations Respirations 18  Systolic BP Systolic BP 99  Diastolic BP (mmHg) Diastolic BP (mmHg) 66  Mean BP 77  Pulse Ox % Pulse Ox % 99  Pulse Ox Activity Level  At rest  Oxygen Delivery Room Air/ 21 %  *Intake and Output.:   Daily 21-Mar-16 07:00  Grand Totals Intake:  740 Output:  975    Net:  -235 24 Hr.:  -235  Oral Intake      In:  240  IV (Primary)      In:  500  Urine ml     Out:  975  Length of Stay Totals Intake:  1220 Output:  3175    Net:  -1955   Brief Assessment:  GEN well developed, well nourished, no acute distress   Cardiac Irregular  murmur present  + JVD  --Gallop   Respiratory normal resp effort  clear BS  rhonchi  crackles   Gastrointestinal Normal   Gastrointestinal details normal Soft  Nontender  Nondistended   EXTR negative cyanosis/clubbing, positive edema, 3+   Lab Results: Thyroid:  21-Mar-16 05:38   Thyroxine, Free  1.54 (0.61-1.12 NOTE: New Reference Range  06/04/14)  Routine Chem:  21-Mar-16 05:38   Magnesium, Serum 2.4 (1.7-2.4 THERAPEUTIC RANGE: 4-7 mg/dL TOXIC: > 10 mg/dL  ----------------------- NOTE: New Reference Range  06/04/14)  Glucose, Serum  113 (65-99 NOTE: New Reference Range  06/04/14)  BUN  33 (6-20 NOTE: New Reference Range  06/04/14)  Creatinine (comp)  1.57 (0.61-1.24 NOTE: New Reference Range  06/04/14)  Sodium, Serum 135 (135-145 NOTE: New Reference Range  06/04/14)  Potassium, Serum  3.4 (3.5-5.1 NOTE: New Reference Range  06/04/14)  Chloride, Serum  99 (101-111 NOTE: New Reference Range  06/04/14)  CO2, Serum 26 (22-32 NOTE: New Reference Range   06/04/14)  Calcium (Total), Serum  8.8 (8.9-10.3 NOTE: New Reference Range  06/04/14)  Anion Gap 10  eGFR (African American)  51  eGFR (Non-African American)  44 (eGFR values <60m/min/1.73 m2 may be an indication of chronic kidney disease (CKD). Calculated eGFR is useful in patients with stable renal function. The eGFR calculation will not be reliable in acutely ill patients when serum creatinine is changing rapidly. It is not useful in patients on dialysis. The eGFR calculation may not be applicable to patients at the low and high extremes of body sizes, pregnant women, and vegetarians.)  Routine Coag:  21-Mar-16 05:38   Prothrombin  26.0 (11.4-15.0 NOTE: New Reference Range  05/10/14)  INR 2.4 (INR reference interval applies to patients on anticoagulant therapy. A single INR therapeutic range for coumarins is not optimal for all indications; however, the suggested range for most indications is 2.0 - 3.0. Exceptions to the INR Reference Range may include: Prosthetic heart valves, acute myocardial infarction, prevention of myocardial infarction, and combinations of aspirin and anticoagulant. The need for a higher or lower target INR must be assessed individually. Reference: The Pharmacology and Management of the Vitamin K  antagonists: the seventh ACCP Conference on Antithrombotic and Thrombolytic Therapy. [id number].3154Sept:126 (3suppl): [id number] A HCT value >55% may artifactually increase the PT.  In one study,  the increase  was an average of 25%. Reference:  "Effect on Routine and Special Coagulation Testing Values of Citrate Anticoagulant Adjustment in Patients with High HCT Values." American Journal of Clinical Pathology 2006;126:400-405.)   Radiology Results: Cardiology:    18-Mar-16 18:35, Echo Doppler  Echo Doppler   REASON FOR EXAM:      COMMENTS:       PROCEDURE: [place] - ECHO DOPPLER COMPLETE(TRANSTHOR)  - Jun 28 2014  6:35PM     RESULT: Echocardiogram  Report    Patient Name:   DSHAUN REPPUCCI Date of Exam: 06/28/2014  Medical Rec #:  [id number]           Custom1:  Date of Birth:  [birth year]       Height:       72.0 in  Patient Age:    69 years         Weight:       217.0 lb  Patient Gender: M                BSA:          2.21 m??    Indications: CHF  Sonographer:    Arville Go RDCS  Referring Phys: [facility]    Sonographer Comments: Suboptimal subcostal window.    Summary:   1. Left ventricular ejection fraction, by visual estimation, is 20 to   25%.   2. Severely decreased global left ventricular systolic function.   3. Severely increased left ventricular internal cavity size.   4. Moderately enlarged right ventricle.   5. Moderately reduced RV systolic function.   6. Moderately dilated left atrium.   7. Moderately dilated right atrium.   8. Degenerative mitral valve.   9. Mild to moderate mitral valve regurgitation.  10. Moderate thickening and calcification of the anterior and posterior   mitral valve leaflets.  11. Moderate tricuspid regurgitation.  12. Mildly increased left ventricular posterior wall thickness.  13. Dilated cardiomyopathy.  2D AND M-MODE MEASUREMENTS (normal ranges within parentheses):  Left Ventricle:          Normal  IVSd (2D):      1.25 cm (0.7-1.1)  LVPWd (2D):     1.22 cm (0.7-1.1) Aorta/LA:                  Normal  LVIDd (2D):     6.52 cm (3.4-5.7) Aortic Root (2D): 3.10 cm (2.4-3.7)  LVIDs (2D):     5.86 cm           Left Atrium (2D): 5.50 cm (1.9-4.0)  LV FS (2D):     10.1 %   (>25%)  LV EF (2D):     21.6 %   (>50%)                                    Right Ventricle:                                    RVd (2D):  LV DIASTOLIC FUNCTION:  MV Peak E: 1.05 m/s  SPECTRAL DOPPLER ANALYSIS (where applicable):  Mitral Valve:  MV Max Vel:   1.29 m/s MV P1/2 Time: 65.00 msec  MV Mean Grad: 3.5 mmHg MV Area, PHT: 3.38 cm??  Aortic Valve: AoV Max Vel: 1.06 m/s AoV Peak PG: 4.5 mmHg AoV Mean  PG:  LVOT Vmax:  LVOT VTI:  LVOT Diameter: 2.60 cm  Aortic Insufficiency:  AI Half-time:  721 msec  AI Decel Rate: 1.18 m/s??  Tricuspid Valve and PA/RV Systolic Pressure: TR Max Velocity: 2.08 m/s RA   Pressure: 5 mmHg RVSP/PASP: 22.3 mmHg    PHYSICIAN INTERPRETATION:  Left Ventricle: The left ventricular internal cavity size was severely   increased. LV septal wall thickness was normal. LV posterior wall   thickness was mildly increased. Global LV systolic function was severely   decreased. Left ventricular ejection fraction, by visual estimation, is   20 to 25%. Findings are consistent with dilated cardiomyopathy.  Right Ventricle: The right ventricular size is moderately enlarged.   Global RV systolic function is moderately reduced.  Left Atrium: The left atrium is moderately dilated.  Right Atrium: The right atrium is moderately dilated.  Pericardium: There is no evidence of pericardial effusion.  Mitral Valve: The mitral valve is degenerative in appearance. There is   moderate thickening and calcification of the anterior and posterior   mitral valve leaflets. Mild to moderate mitral valve regurgitation is   seen.  Tricuspid Valve: The tricuspid valve is normal. Moderate tricuspid     regurgitation is visualized. The tricuspid regurgitant velocity is 2.08   m/s, and with an assumed right atrial pressure of 5 mmHg, the estimated   right ventricular systolic pressure is normal at 22.3 mmHg.  Aortic Valve: The aortic valve is tricuspid.  Pulmonic Valve: The pulmonic valve is normal. No indication of pulmonic   valve regurgitation.  Additional Comments: A pacer wire is visualized.    [place] MD  Electronically signed by [place] MD  Signature Date/Time: 06/29/2014/12:54:21 PM    *** Final ***    IMPRESSION: .    Verified By: Yolonda Kida, M.D., MD   Assessment/Plan:  Assessment/Plan:  Assessment IMP  acute on chronic renal insufficiency   congestive heart failure  cardiomyopathy  AICD  leg edema  hypothyroidism  shortness of breath  weakness  coronary artery disease  hypertension  noncompliant .   Plan agree with Nephrology input for renal insufficiency continue Lasix therapy  supplemental oxygen  levothyroxine for thyroid disease  support stockings for edema  DVT prophylaxis  blood pressure control  aspirin for arteriosclerotic vascular disease  consider physical therapy   Electronic Signatures: Yolonda Kida (MD)  (Signed 21-Mar-16 12:19)  Authored: Chief Complaint, VITAL SIGNS/ANCILLARY NOTES, Brief Assessment, Lab Results, Radiology Results, Assessment/Plan   Last Updated: 21-Mar-16 12:19 by Lujean Amel D (MD)

## 2014-08-11 NOTE — Discharge Summary (Signed)
PATIENT NAME:  Nicholas Leonard, Nicholas Leonard MR#:  [id number] DATE OF BIRTH:  1946-04-10  DATE OF ADMISSION:  05/08/2014 DATE OF DISCHARGE:  05/14/2014  FINAL DIAGNOSES: 1.  Hypothyroidism, severe.  2.  Acute renal failure.  3.  Elevated transaminases thought to be secondary to hypothyroidism or hepatic congestion.  4.  Chronic left-sided systolic congestive heart failure.  5.  Adult onset diabetes mellitus, poorly controlled.  6.  Coronary artery disease.  7.  Probable sleep apnea.  8.  Gout.   HISTORY AND PHYSICAL: Please see dictated admission history and physical.   SUMMARY OF HOSPITAL COURSE: The patient was admitted with profound weakness and progressive renal failure. He was found to have TSH greater than 80. He had been noted to have elevated TSH as an outpatient and his medications had been adjusted; however, he had failed to pick up this prescription for higher dose medication. Endocrinology was consulted, and pharmacy records were reviewed; it appears the patient really has not picked up his medication for a couple of months, and the importance of medical compliance was once again stressed to the patient.   He was noted to have renal failure with creatinine greater than 2, approximately twice the patient's baseline. Nephrology saw the patient. Ultrasound was performed, which revealed no evidence of blockage, with some evidence of chronic medical disease in the kidneys; his creatinine did improve slightly and was thought likely to be due to his hypothyroidism as well as possible azotemia. At this point, the plan will be to follow as an outpatient and outpatient follow up with nephrology was arranged.   He was noted to have elevated transaminases. Ultrasound was unrevealing. GI saw the patient and again this was thought that maybe was related to his hypothyroidism in addition. This did improve during the course of his hospitalization.   Physical therapy worked with the patient and he ambulated  well, although he had decreased endurance and it was felt that he would benefit from home health physical therapy. The patient was discharged to home in stable condition with his physical activity to be up as tolerated. Follow-up was made in our office in 1 to 2 weeks and with nephrology in 1 to 2 weeks. He should follow a 2 gram sodium, 1800 calorie ADA diet. He should check his blood sugar daily and record this. It is recommended that he weigh himself daily, calling for more than 2 pound gain in one day or 5 pounds in one week or increasing signs or symptoms of heart failure. Home health physical therapy and nursing were arranged for the patient.   DISCHARGE MEDICATIONS: 1.  Glimepiride 1 mg p.o. daily.  2.  Amiodarone 200 mg p.o. daily.  3.  Allopurinol 100 mg p.o. daily.  4.  Carvedilol 3.125 mg p.o. b.i.d.  5.  Coumadin 5 mg p.o. at bedtime.  6.  Lasix 40 mg p.o. daily as needed for edema.  7.  Potassium 20 mEq p.o. daily as needed with furosemide.  8.  Levothyroxine 0.1 mg p.o. daily; instructions in how to take this were stressed to the patient. 9.  Ondansetron 4 mg p.o. t.i.d. p.r.n. nausea or vomiting.   The patients Colcrys was held. Atorvastatin was held secondary to elevated transaminases. He had been on amoxicillin and this was stopped. ____________________________ Lynnea FerrierBert J. Chisom Muntean III, MD bjk:sb D: 05/20/2014 08:01:08 ET T: 05/20/2014 08:38:01 ET JOB#: [id number]  cc: Lynnea FerrierBert J. Laker Thompson III, MD, <Dictator>

## 2014-08-11 NOTE — H&P (Signed)
PATIENT NAME:  Nicholas Leonard, [name] MR#:  [id number] DATE OF BIRTH:  [birth year]  DATE OF ADMISSION:  06/28/2014  CHIEF COMPLAINT: Increasing edema and shortness of breath.   HISTORY OF PRESENT ILLNESS: A 69 year old male with a history of chronic left-sided systolic congestive heart failure with coronary artery disease, prior pulmonary embolism, diabetes, hypertension, with history of defibrillator placement, who was hospitalized in February of this year with acute renal failure and severe hypothyroidism, which appeared to be secondary to medical noncompliance. He has a history of varying Coumadin levels as well, at least in part secondary to poor medical compliance. He gives a complicated history over the last 1 week of increasing leg edema, as well as penile edema, decreased urine flow, increasing shortness of breath. There does not appear to have been an abrupt change. [facility] coming into the Emergency Room there for increasing edema; however, on that same day he was in the Emergency Room, he apparently was seen by his nephrologist and was advised to increase Lasix and to add Zaroxolyn. It does not look like he has picked up the Zaroxolyn although he said he could not get it from his pharmacy. He had called stating he needed an emergent appointment and was set up to see us today, but in the interim was evaluated by his endocrinologist. He does not seem to recall the order of any of these visits, however. On arrival, he was noted to have a systolic blood pressure of 88, felt very weak, not able to ambulate well, and so he is admitted for worsening heart failure, hypotension, altered mental status.   PAST MEDICAL HISTORY: 1.  Hypothyroidism.  2.  Chronic kidney disease stage disease stage 3, with admission in February for acute renal failure. Nephrology evaluation at that time.  3.  History of elevated liver enzymes, possibly secondary to hypothyroidism  or hepatic congestion.  4.  Chronic left-sided systolic congestive heart failure.  5.  Coronary artery disease.  6.  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  7.  Adult onset diabetes mellitus.  8.  Osteoarthritis.  9.  Hypertension.  10.   Ischemic cardiomyopathy as noted above, status post defibrillator placement.  11.   History of coronary artery bypass surgery.  12.   History of gout.   ALLERGIES: NIASPAN.   MEDICATIONS: 1.  Allopurinol 100 mg p.o. daily.  2.  Amiodarone 200 mg p.o. daily.  3.  Lipitor 80 mg p.o. at bedtime.  4.  Carvedilol 3.125 mg p.o. b.i.d.  5.  Colcrys 0.6 mg p.o. daily as needed for gout.  6.  Vitamin D 50,000 units p.o. q. week.  7.  Lasix 80 mg p.o. daily, which was recently supposed to be increased to 1.5 in the morning, 1 at night.  8.  Glimepiride 1 mg p.o. daily.  9.  Levothyroxine 0.125 mg p.o. daily.  10.   Potassium 20 mEq p.o. daily with Lasix.  11.   Coumadin 10 mg p.o. daily. Recent INR elevated.   SOCIAL HISTORY: Remote tobacco. Occasional alcohol.   FAMILY HISTORY: Coronary artery disease.   REVIEW OF SYSTEMS: Please see HPI. Denies fevers, chills, cough. Some orthopnea. The remainder of complete review of systems is negative.   PHYSICAL EXAMINATION: VITAL SIGNS: Pulse 56, blood pressure 88/58, respiratory rate 14.  GENERAL: Disheveled male, appears very fatigued.  EYES: Pupils round and reactive to light. Lids and conjunctivae  are unremarkable.  EARS, NOSE AND THROAT: Examination unremarkable. The oropharynx is moist without lesions.  NECK: Supple. Trachea midline. No thyromegaly.  CARDIOVASCULAR: Regular rate and rhythm without gallops or rubs. Postoperative changes.  LUNGS: Crackles are heard in the bases bilaterally without wheeze or retractions.  ABDOMEN: Soft, nontender, positive bowel sounds. No guard or rebound.  SKIN: Circular ulceration on the left leg consistent with venous  stasis ulcer. Minimal erythema around that leg without evidence of infection.  LYMPH NODES: No cervical or supraclavicular nodes.  MUSCULOSKELETAL: With 2 to 3+ edema bilateral extremities extending to above the knees. Scrotal edema as well.  NEUROLOGIC: Cranial nerves are intact. Motor strength appears to be symmetrical but grossly reduced.   IMPRESSION AND PLAN: 1.  Acute on chronic left-sided systolic congestive heart failure. Admit and try to diurese with Lasix IV as able. Nephrology consultation for assistance. Close monitoring of his renal function. Repeat echocardiogram and follow cardiac enzymes to make sure these have not changed. Continue amiodarone for now. Last report shows that his INR was quite high, we will hold Coumadin until we see what this level looks like and adjust.  2.  Hypothyroidism. Recheck a TSH. We will try to ensure that he is actually taking his medication.  3.  Diabetes mellitus. Hold all medications now until we see what his renal function looks like. Cover with sliding scale insulin.  4.  History of gout. Cover with Colcrys as needed. Holding allopurinol until renal function is further evaluated.   DISCHARGE PLANNING: We will ask care management to see the patient. We will ask palliative care to see this patient who has multiple Emergency Room visits and hospitalizations over the last several months.    ____________________________ Lynnea Ferrier, MD bjk:at D: 06/28/2014 17:16:10 ET T: 06/28/2014 17:58:11 ET JOB#: [id number]  cc: Lynnea Ferrier, MD, <Dictator> Daniel Nones MD ELECTRONICALLY SIGNED 07/04/2014 8:28

## 2014-08-11 NOTE — H&P (Signed)
PATIENT NAME:  Nicholas Leonard, FRED MR#:  [id number] DATE OF BIRTH:  11/05/1945  DATE OF ADMISSION:  07/19/2014  REFERRING PHYSICIAN: Su Leyobert L. Kinner, MD   PRIMARY CARE PHYSICIAN: dr Orlie Dakinklein [facility]   CHIEF COMPLAINT: Elevated INR.   HISTORY OF PRESENT ILLNESS: A 69 year old Caucasian gentleman with a history of PE/DVT, on warfarin for anticoagulation, as well as chronic kidney disease, baseline creatinine around 1.5, presented with elevated INR; had INR checked a day ago and was found to be 14 by his PCP and instructed to come to the Emergency Department for further workup and evaluation. He denies any active bleeding, however, does state easy bruising; presented to the hospital for further workup and evaluation.   REVIEW OF SYSTEMS:   CONSTITUTIONAL: Denies any fevers, chills, fatigue, weakness.  EYES: Denies blurred vision, double vision, eye pain.  EARS, NOSE, THROAT: Denies tinnitus, ear pain, hearing loss.  RESPIRATORY:  Denies cough, wheeze, shortness of breath.    CARDIOVASCULAR: Denies chest pain, palpitations. Positive for edema, somewhat better than baseline.  GASTROINTESTINAL: Denies nausea, vomiting, diarrhea, abdominal pain.  GENITOURINARY: Denies dysuria, hematuria.  ENDOCRINE: Denies nocturia, thyroid problems. HEMATOLOGIC AND LYMPHATIC:  Positive for easy bruising. Denies bleeding.  SKIN: Denies rash or lesion.  MUSCULOSKELETAL: Denies pain in neck, back, shoulder, knees, hips or arthritic symptoms.  NEUROLOGIC: Denies paralysis, paresthesias.  PSYCHIATRIC: Denies anxiety or depressive symptoms.  Otherwise full review of systems performed by me is negative.    PAST MEDICAL HISTORY: Hypothyroidism, unspecified, chronic kidney disease stage III, baseline creatinine of 1.5, systolic congestive heart failure, coronary artery disease, history of PE and DVT, on Coumadin anticoagulation, type 2 diabetes non insulin requiring, hypertension essential.     SOCIAL HISTORY: Remote  tobacco use. Occasional alcohol use. Denies any drug use. Uses a walker at baseline.    FAMILY HISTORY:  Coronary artery disease.    ALLERGIES: Denies.    HOME MEDICATIONS: Include amiodarone 200 mg p.o. q. daily, warfarin 4 mg p.o. b.i.d., glimepiride 1 mg p.o. q. daily, allopurinol 100 mg p.o. q. daily, metoprolol succinate 25 mg p.o. q. daily, potassium 20 mEq 2 tablets  b.i.d., levothyroxine 137 mcg daily, vitamin D two 50,000 international units p.o. q. weekly on Mondays.   PHYSICAL EXAMINATION:  VITAL SIGNS: Temperature 97.6, heart rate 81, respirations 20, blood pressure 92/67, saturating 98% on room air, weight 100.2 kg, BMI 30.  GENERAL: Chronically-ill, frail-appearing Caucasian gentleman currently in no acute distress.  HEAD: Normocephalic, atraumatic.  EYES: Pupils equal, round, react to light. Extraocular muscles intact. No scleral icterus.  MOUTH: Moist mucosal membrane. Dentition intact. No abscess noted.  EAR, NOSE, THROAT: Clear without exudates. No external lesions.  NECK: Supple. No thyromegaly. No nodules. No JVD.  PULMONARY: Clear to auscultation bilaterally without wheeze, rales, or rhonchi. No use of accessory muscles. Good respiratory effort.  CHEST: Nontender to palpation.  CARDIOVASCULAR: S1, S2, regular rate and rhythm. No murmurs, rubs, or gallops. Two plus edema to the knees bilaterally. Pedal pulses 2+ bilaterally. GASTROINTESTINAL: Soft, nontender, nondistended. No masses. Positive bowel sounds. No hepatosplenomegaly.  MUSCULOSKELETAL: No swelling, clubbing, positive for edema as described above. Range of motion full in all extremities.  NEUROLOGIC: Cranial nerves II through XII intact. No gross focal neurological deficits. Sensation intact. Reflexes intact.  SKIN: No ulceration, lesions, rashes, or cyanosis. Skin is warm and dry. Turgor intact. Areas of ecchymosis prominent over the right and left forearm. There is evidence of gouty tophi over the elbows as well.  PSYCHIATRIC: Mood, affect within normal limits. The patient is awake, alert, and oriented x 3. Insight and judgment intact.   LABORATORY DATA: Sodium of 130, potassium 5.4, chloride 97, bicarbonate 23, BUN 36, creatinine 2.19, glucose 157. LFTs: Albumin of 3.9, bilirubin 1.9, alkaline phosphatase of 129; otherwise, within normal limits. WBC of 5, hemoglobin 12.2, platelets of 208,000,  INR of 12.4.   ASSESSMENT AND PLAN: A 69 year old Caucasian gentleman with a history of pulmonary embolus, deep vein thrombosis, on warfarin for anticoagulation, presenting with elevated INR.  1.  Supratherapeutic INR. Received vitamin K in the Emergency Department, continue to follow INR trend. No active bleeding. No further interventions required at this time.   2.  Acute kidney injury with volume overload though intravascularly depleted. Gentle intravenous fluid hydration given systolic congestive heart failure follow urine output and renal function.  3.  Type 2 diabetes, uncomplicated. Hold p.o. agents, add insulin sliding scale q. 6 hour Accu-Cheks.    4.  Hyperkalemia. Given his acute kidney injury, IV fluid hydration as stated above. Follow potassium trend.  5.  Venous thromboembolic prophylaxis. Supratherapeutic INR. Hold for now.   CODE STATUS: The patient is full code.   TIME SPENT: 35 minutes.   ____________________________ Cletis Athens. Icis Budreau, MD dkh:AT D: 07/19/2014 23:10:01 ET T: 07/19/2014 23:39:33 ET JOB#: [id number]  cc: Cletis Athens. Ransome Helwig, MD, <Dictator> Chasity Outten Synetta Shadow MD ELECTRONICALLY SIGNED 07/20/2014 1:39

## 2014-08-11 NOTE — Consult Note (Signed)
PATIENT NAME:  Nicholas Leonard, Nicholas Leonard MR#:  [id number] DATE OF BIRTH:  02-20-1946  DATE OF CONSULTATION:  05/12/2014  CONSULTING PHYSICIAN:  Ezzard Standing. Nikoleta Dady, MD  REASON FOR REFERRAL: Abnormal liver enzymes.   HISTORY OF PRESENT ILLNESS: The patient is a 69 year old, white male who basically came in to the emergency room on 01/27 due to increasing generalized weakness, fatigue and malaise. It was even hard for him to walk up the staircase. He was found to have worsening renal function. His thyroid function was quite abnormal as well. The patient has multiple other medical history includes coronary artery disease and history of MI as well as hypertension and diabetes. He also has a history of pulmonary embolism and DVT. The reason I was consulted was the liver enzymes were noted to be abnormal. As far as he knows, he denied having any liver issues in the past. In fact, there was a lab from September 2014 that was normal.   He had an ultrasound of the abdomen to evaluate his kidneys. This was done on admission. The liver looked normal then as well. The patient denies any prior history of viral hepatitis. He used to drink but quit a couple of years ago. He has been taking amiodarone for his heart condition for the past several years. He also has been taking her Lipitor for many years, as long as he can remember, to control his cholesterol level. He has no specific GI complaints. There is no nausea, vomiting, heartburn or indigestion. He denies any anorexia. He denies any weight loss. His bowel movements are okay. He denies any gross hematochezia or melena. He denies any abdominal pain or cramping anywhere.   REVIEW OF SYSTEMS: Please refer to the initial review of symptoms dictated by Dr. Sheryle Hail on admission. There are no significant changes.   PAST MEDICAL HISTORY: Again other past medical history includes history of ischemic cardiomyopathy with resulting bypass surgery and valve repair in June 2015. He does have a  history of hypothyroidism, gout and arthritis, in addition to history of diabetes and pulmonary embolism.   PAST SURGICAL HISTORY: AICD placement. He also had an IVC filter placed. At the time of bypass surgery, he had a valve repair as well.   SOCIAL HISTORY: He used to smoke but quit many years ago. He denied any alcohol use recently.   FAMILY HISTORY: Notable for heart condition.   HOME MEDICATIONS: Include amiodarone 200 mg daily, allopurinol daily, Lipitor 80 mg at bedtime, carvedilol twice a day, Colcrys daily, Lasix 80 mg 2 tablets daily, glimepiride 1 tablet daily, potassium 2 tablets twice a day, levothyroxine 75 mcg daily and warfarin 10 mg daily.   ALLERGIES: HE IS ALLERGIC TO NIASPAN, NIACIN AND TAPES.   DIAGNOSTIC DATA: Chest x-ray showed evidence of pleural effusion and some right bibasilar opacity suggesting atelectasis.    LABORATORY DATA: His liver enzymes showed a bilirubin of 1.4 on January 27. His direct bilirubin is 0.7, alkaline phosphatase 149, AST 60 and ALT 65. Labs from January 29 showed bilirubin is up going up to 1.9, alkaline phosphatase 150, AST 96 and ALT 87. This morning it was even higher; bilirubin is now up to 3.0, AST 377, ALT 245. Kidney function is slowly improving with gentle IV hydration. His creatinine is now down to 2.08. BUN is 43. INR is 3.1.   ASSESSMENT AND PLAN: This is a patient with rising liver enzymes. Many possibilities exist. He has been on amiodarone for a long period of time.  Certainly, amiodarone can cause liver enzymes to go up. Lipitor can also do that as well. People with hypothyroidism can have liver enzyme abnormalities. Congestive heart failure can cause liver congestion and cause liver enzymes to go up. Initial hepatitis panel was negative, but other autoimmune liver disease or other types of viral hepatitis can cause liver enzymes to go up. I would recommend that we correct his heart condition and try to adjust his thyroid medication and  see whether the liver enzymes improve or not. I will order some other liver serologies and hepatitis panel as well. I will try to avoid and hold any potential hepatotoxic medications. Certainly liver enzymes need to continue to be monitored.   Thank you for the referral.      ____________________________ Ezzard StandingPaul Y. Bluford Kaufmannh, MD pyo:TT D: 05/12/2014 11:26:43 ET T: 05/12/2014 11:43:16 ET JOB#: [id number]  cc: Ezzard StandingPaul Y. Bluford Kaufmannh, MD, <Dictator> Ezzard StandingPAUL Y Aanvi Voyles MD ELECTRONICALLY SIGNED 05/14/2014 10:33

## 2014-08-11 NOTE — Discharge Summary (Signed)
PATIENT NAME:  Nicholas Leonard, Nicholas Leonard MR#:  [id number] DATE OF BIRTH:  09/08/1945  DATE OF ADMISSION:  07/19/2014 DATE OF DISCHARGE:  07/26/2014  FINAL DIAGNOSES: 1.  Elevated INR.  2.  Acute on chronic left-sided systolic congestive heart failure.  3.  Hyperthyroidism.  4.  Gout, tophaceous, chronic, involving multiple joints.  5.  Chronic kidney disease, stage III. 6.  Acute renal failure.  7.  Coronary artery disease.  8.  History of pulmonary embolism and prior deep vein thrombosis.  9.  Adult onset diabetes mellitus.   HISTORY AND PHYSICAL: Please see dictated admission history and physical.   SUMMARY OF HOSPITAL COURSE: The patient was admitted with markedly elevated INR, also with acute on chronic left-sided systolic congestive heart failure. It was felt likely due to gut edema. He was having poor vitamin K absorption which led to his hypocoagulable state. It has been a challenge as well to try to keep his thyroid at a target, likely from the same reasoning. He was given vitamin K and his INR corrected fairly rapidly. Due to his difficulty with Coumadin, the decision was made to change him over to Eliquis, and he was started on this and tolerated it without any major issues.   His TSH was again elevated and his thyroid medications were adjusted. This needs to be followed as an outpatient.   The patient was noted to have diffuse edema, and abdominal films revealed evidence of ascites as well, confirmed by ultrasound. He was placed on a Lasix drip under the direction of nephrology. He had excellent urine output with this. Zaroxolyn was added, which also improved his urine output, although he developed significant hypokalemia. Medications were adjusted and his potassium was corrected, with a potassium of 4.1 on day of discharge.  He is ambulating well but did appear that he would benefit from home health physical therapy. In addition, due to the changes in his medicine regimen, as well as the  presence of venostasis ulcers, which were present on admission, home health nursing was arranged as well. At this time he will be discharged to home in stable condition. His physical activity will be up with a walker as tolerated. He should follow a 2 gram sodium diet. He should weigh himself daily, calling for more than 2 pound gain in one day or 5 pounds in one week or increasing signs or symptoms of heart failure, which he is well familiar. He will follow up with Dr. Cherylann RatelLateef within 1 week and follow up in our office in the next 2 weeks. Home health physical therapy and nursing were ordered as noted above. He should check sugars and blood pressure daily in addition.   DISCHARGE MEDICATIONS: 1.  Vitamin D 50,000 units p.o. q. week.  2.  Allopurinol 100 mg p.o. daily. 3.  Amiodarone 200 mg p.o. daily.  4.  Toprol-XL 25 mg p.o. daily. 5.  Glimepiride 1 mg p.o. q. a.m.  6.  Potassium 10 mEq p.o. b.i.d., with Lasix. 7.  Levothyroxine 0.15 mg p.o. daily, dose increased.  8.  Apixaban 2.5 mg p.o. b.i.d.  9.  Collagenase ointment topically daily to his leg wounds.  10.  Furosemide 40 mg p.o. b.i.d. He should hold this if his systolic blood pressure is less than 95.  11.  Metolazone 2.5 mg p.o. daily.  NOTE: He was given instructions to stop Coumadin.   ____________________________ Lynnea FerrierBert J. Nakiea Metzner III, MD bjk:sb D: 07/26/2014 07:42:38 ET T: 07/26/2014 12:35:12 ET JOB#: [id number]  cc:  Curtis Sites III, MD, <Dictator> Daniel Nones MD ELECTRONICALLY SIGNED 07/29/2014 12:50

## 2014-08-11 NOTE — Consult Note (Signed)
Chief Complaint:  Subjective/Chief Complaint Patient feels better today less shortness of breath still has leg edema but is able to walk and his breathing is improved.   VITAL SIGNS/ANCILLARY NOTES: **Vital Signs.:   20-Mar-16 11:51  Vital Signs Type Routine  Temperature Temperature (F) 98  Celsius 36.6  Pulse Pulse 89  Respirations Respirations 18  Systolic BP Systolic BP 702  Diastolic BP (mmHg) Diastolic BP (mmHg) 79  Mean BP 88  Pulse Ox % Pulse Ox % 96  Pulse Ox Activity Level  At rest  Oxygen Delivery Room Air/ 21 %  *Intake and Output.:   Daily 20-Mar-16 07:00  Grand Totals Intake:  480 Output:  1100    Net:  -620 24 Hr.:  -620  Oral Intake      In:  480  Urine ml     Out:  1100  Length of Stay Totals Intake:  480 Output:  2200    Net:  -1720   Brief Assessment:  GEN well developed, well nourished, no acute distress   Cardiac Irregular  murmur present  + JVD  --Gallop   Respiratory normal resp effort  clear BS  rhonchi  crackles   Gastrointestinal Normal   Gastrointestinal details normal Soft  Nontender  Nondistended   EXTR negative cyanosis/clubbing, positive edema, 3+   Lab Results: Routine Chem:  20-Mar-16 04:50   Glucose, Serum 96 (65-99 NOTE: New Reference Range  06/04/14)  BUN  35 (6-20 NOTE: New Reference Range  06/04/14)  Creatinine (comp)  1.51 (0.61-1.24 NOTE: New Reference Range  06/04/14)  Sodium, Serum  134 (135-145 NOTE: New Reference Range  06/04/14)  Potassium, Serum  3.4 (3.5-5.1 NOTE: New Reference Range  06/04/14)  Chloride, Serum  100 (101-111 NOTE: New Reference Range  06/04/14)  CO2, Serum 25 (22-32 NOTE: New Reference Range  06/04/14)  Calcium (Total), Serum  8.6 (8.9-10.3 NOTE: New Reference Range  06/04/14)  Anion Gap 9  eGFR (African American)  54  eGFR (Non-African American)  46 (eGFR values <33m/min/1.73 m2 may be an indication of chronic kidney disease (CKD). Calculated eGFR is useful in patients with  stable renal function. The eGFR calculation will not be reliable in acutely ill patients when serum creatinine is changing rapidly. It is not useful in patients on dialysis. The eGFR calculation may not be applicable to patients at the low and high extremes of body sizes, pregnant women, and vegetarians.)  Routine Coag:  20-Mar-16 04:50   Prothrombin  27.4 (11.4-15.0 NOTE: New Reference Range  05/10/14)  INR 2.5 (INR reference interval applies to patients on anticoagulant therapy. A single INR therapeutic range for coumarins is not optimal for all indications; however, the suggested range for most indications is 2.0 - 3.0. Exceptions to the INR Reference Range may include: Prosthetic heart valves, acute myocardial infarction, prevention of myocardial infarction, and combinations of aspirin and anticoagulant. The need for a higher or lower target INR must be assessed individually. Reference: The Pharmacology and Management of the Vitamin K  antagonists: the seventh ACCP Conference on Antithrombotic and Thrombolytic Therapy. COVZCH.8850Sept:126 (3suppl): [id number] A HCT value >55% may artifactually increase the PT.  In one study,  the increase was an average of 25%. Reference:  "Effect on Routine and Special Coagulation Testing Values of Citrate Anticoagulant Adjustment in Patients with High HCT Values." American Journal of Clinical Pathology 2006;126:400-405.)  Routine Hem:  20-Mar-16 04:50   WBC (CBC) 4.8  RBC (CBC)  4.31  Hemoglobin (CBC)  11.3  Hematocrit (CBC)  35.5  Platelet Count (CBC) 180  MCV 82  MCH 26.3  MCHC  31.9  RDW  20.8  Neutrophil % 67.0  Lymphocyte % 20.1  Monocyte % 9.9  Eosinophil % 2.0  Basophil % 1.0  Neutrophil # 3.2  Lymphocyte # 1.0  Monocyte # 0.5  Eosinophil # 0.1  Basophil # 0.0 (Result(s) reported on 30 Jun 2014 at 05:17AM.)   Radiology Results: XRay:    18-Mar-16 19:13, Chest PA and Lateral  Chest PA and Lateral   REASON FOR EXAM:     dyspnea  COMMENTS:       PROCEDURE: DXR - DXR CHEST PA (OR AP) AND LATERAL  - Jun 28 2014  7:13PM     CLINICAL DATA:  Acute shortness of breath and extremity swelling.    EXAM:  CHEST  2 VIEW    COMPARISON:  05/08/2014 and prior chest radiographs    FINDINGS:  Cardiomegaly, cardiac valve replacement and CABG changes, and right  sided AICD again noted.  A small right pleural effusion and slightly increased right basilar  atelectasis again noted.    There is no evidence of pulmonary edema or pneumothorax.    No acute bony abnormalities are identified.     IMPRESSION:  Slightly increased right basilar atelectasis with continued small  right pleural effusion.    Cardiomegaly and cardiac valve replacement/CABG changes.      Electronically Signed    By: Margarette Canada M.D.    On: 06/28/2014 20:29         Verified By: Lura Em, M.D.,  Cardiology:    18-Mar-16 18:35, Echo Doppler  Echo Doppler   REASON FOR EXAM:      COMMENTS:       PROCEDURE: [facility] - ECHO DOPPLER COMPLETE(TRANSTHOR)  - Jun 28 2014  6:35PM     RESULT: Echocardiogram Report    Patient Name:   Nicholas Leonard Date of Exam: 06/28/2014  Medical Rec #:  [id number]           Custom1:  Date of Birth:  May 06, 1945       Height:       72.0 in  Patient Age:    69 years         Weight:       217.0 lb  Patient Gender: M                BSA:          2.21 m??    Indications: CHF  Sonographer:    Arville Go RDCS  Referring Phys: [facility]    Sonographer Comments: Suboptimal subcostal window.    Summary:   1. Left ventricular ejection fraction, by visual estimation, is 20 to   25%.   2. Severely decreased global left ventricular systolic function.   3. Severely increased left ventricular internal cavity size.   4. Moderately enlarged right ventricle.   5. Moderately reduced RV systolic function.   6. Moderately dilated left atrium.   7. Moderately dilated right atrium.   8. Degenerative mitral valve.    9. Mild to moderate mitral valve regurgitation.  10. Moderate thickening and calcification of the anterior and posterior   mitral valve leaflets.  11. Moderate tricuspid regurgitation.  12. Mildly increased left ventricular posterior wall thickness.  13. Dilated cardiomyopathy.  2D AND M-MODE MEASUREMENTS (normal ranges within parentheses):  Left Ventricle:          Normal  IVSd (2D):      1.25 cm (0.7-1.1)  LVPWd (2D):     1.22 cm (0.7-1.1) Aorta/LA:                  Normal  LVIDd (2D):     6.52 cm (3.4-5.7) Aortic Root (2D): 3.10 cm (2.4-3.7)  LVIDs (2D):     5.86 cm           Left Atrium (2D): 5.50 cm (1.9-4.0)  LV FS (2D):     10.1 %   (>25%)  LV EF (2D):     21.6 %   (>50%)                                    Right Ventricle:                                    RVd (2D):  LV DIASTOLIC FUNCTION:  MV Peak E: 1.05 m/s  SPECTRAL DOPPLER ANALYSIS (where applicable):  Mitral Valve:  MV Max Vel:   1.29 m/s MV P1/2 Time: 65.00 msec  MV Mean Grad: 3.5 mmHg MV Area, PHT: 3.38 cm??  Aortic Valve: AoV Max Vel: 1.06 m/s AoV Peak PG: 4.5 mmHg AoV Mean PG:  LVOT Vmax:  LVOT VTI:  LVOT Diameter: 2.60 cm  Aortic Insufficiency:  AI Half-time:  721 msec  AI Decel Rate: 1.18 m/s??  Tricuspid Valve and PA/RV Systolic Pressure: TR Max Velocity: 2.08 m/s RA   Pressure: 5 mmHg RVSP/PASP: 22.3 mmHg    PHYSICIAN INTERPRETATION:  Left Ventricle: The left ventricular internal cavity size was severely   increased. LV septal wall thickness was normal. LV posterior wall   thickness was mildly increased. Global LV systolic function was severely   decreased. Left ventricular ejection fraction, by visual estimation, is   20 to 25%. Findings are consistent with dilated cardiomyopathy.  Right Ventricle: The right ventricular size is moderately enlarged.   Global RV systolic function is moderately reduced.  Left Atrium: The left atrium is moderately dilated.  Right Atrium: The right atrium is moderately  dilated.  Pericardium: There is no evidence of pericardial effusion.  Mitral Valve: The mitral valve is degenerative in appearance. There is   moderate thickening and calcification of the anterior and posterior   mitral valve leaflets. Mild to moderate mitral valve regurgitation is   seen.  Tricuspid Valve: The tricuspid valve is normal. Moderate tricuspid     regurgitation is visualized. The tricuspid regurgitant velocity is 2.08   m/s, and with an assumed right atrial pressure of 5 mmHg, the estimated   right ventricular systolic pressure is normal at 22.3 mmHg.  Aortic Valve: The aortic valve is tricuspid.  Pulmonic Valve: The pulmonic valve is normal. No indication of pulmonic   valve regurgitation.  Additional Comments: A pacer wire is visualized.    [place] MD  Electronically signed by [place] MD  Signature Date/Time: 06/29/2014/12:54:21 PM    *** Final ***    IMPRESSION: .    Verified By: Yolonda Kida, M.D., MD   Assessment/Plan:  Assessment/Plan:  Assessment IMP  congestive heart failure  cardiomyopathy  AICD  leg edema  hypothyroidism  shortness of breath  weakness  coronary artery disease  hypertension  noncompliant .   Plan continue Lasix therapy  supplemental oxygen  levothyroxine for thyroid disease  support stockings for edema  DVT prophylaxis  blood pressure control  aspirin for arteriosclerotic vascular disease  consider physical therapy   Electronic Signatures: Yolonda Kida (MD)  (Signed 20-Mar-16 16:44)  Authored: Chief Complaint, VITAL SIGNS/ANCILLARY NOTES, Brief Assessment, Lab Results, Radiology Results, Assessment/Plan   Last Updated: 20-Mar-16 16:44 by Yolonda Kida (MD)

## 2014-08-11 NOTE — Discharge Summary (Signed)
PATIENT NAME:  Nicholas LedgerRKER, Hector E MR#:  [id number] DATE OF BIRTH:  Dec 29, 1945  DATE OF ADMISSION:  05/08/2014 DATE OF DISCHARGE:  05/14/2014  FINAL DIAGNOSES: 1.  Hypothyroidism, severe.  2.  Acute renal failure.  3.  Elevated transaminases thought to be secondary to hypothyroidism or hepatic congestion.  4.  Chronic left-sided systolic congestive heart failure.  5.  Adult onset diabetes mellitus, poorly controlled.  6.  Coronary artery disease.  7.  Probable sleep apnea.  8.  Gout.   HISTORY AND PHYSICAL: Please see dictated admission history and physical.   SUMMARY OF HOSPITAL COURSE: The patient was admitted with profound weakness and progressive renal failure. He was found to have TSH greater than 80. He had been noted to have elevated TSH as an outpatient and his medications had been adjusted; however, he had failed to pick up this prescription for higher dose medication. Endocrinology was consulted, and pharmacy records were reviewed; it appears the patient really has not picked up his medication for a couple of months, and the importance of medical compliance was once again stressed to the patient.   He was noted to have renal failure with creatinine greater than 2, approximately twice the patient's baseline. Nephrology saw the patient. Ultrasound was performed, which revealed no evidence of blockage, with some evidence of chronic medical disease in the kidneys; his creatinine did improve slightly and was thought likely to be due to his hypothyroidism as well as possible azotemia. At this point, the plan will be to follow as an outpatient and outpatient follow up with nephrology was arranged.   He was noted to have elevated transaminases. Ultrasound was unrevealing. GI saw the patient and again this was thought that maybe was related to his hypothyroidism in addition. This did improve during the course of his hospitalization.   Physical therapy worked with the patient and he ambulated  well, although he had decreased endurance and it was felt that he would benefit from home health physical therapy. The patient was discharged to home in stable condition with his physical activity to be up as tolerated. Follow-up was made in our office in 1 to 2 weeks and with nephrology in 1 to 2 weeks. He should follow a 2 gram sodium, 1800 calorie ADA diet. He should check his blood sugar daily and record this. It is recommended that he weigh himself daily, calling for more than 2 pound gain in one day or 5 pounds in one week or increasing signs or symptoms of heart failure. Home health physical therapy and nursing were arranged for the patient.   DISCHARGE MEDICATIONS: 1.  Glimepiride 1 mg p.o. daily.  2.  Amiodarone 200 mg p.o. daily.  3.  Allopurinol 100 mg p.o. daily.  4.  Carvedilol 3.125 mg p.o. b.i.d.  5.  Coumadin 5 mg p.o. at bedtime.  6.  Lasix 40 mg p.o. daily as needed for edema.  7.  Potassium 20 mEq p.o. daily as needed with furosemide.  8.  Levothyroxine 0.1 mg p.o. daily; instructions in how to take this were stressed to the patient. 9.  Ondansetron 4 mg p.o. t.i.d. p.r.n. nausea or vomiting.   The patient's Colcrys was held. Atorvastatin was held secondary to elevated transaminases. He had been on amoxicillin and this was stopped.  ____________________________ Lynnea FerrierBert J. Klein III, MD bjk:sb D: 05/20/2014 08:01:08 ET T: 05/20/2014 08:38:33 ET JOB#: [id number]  cc: Lynnea FerrierBert J. Klein III, MD, <Dictator> Daniel NonesBERT KLEIN MD ELECTRONICALLY SIGNED 05/27/2014 13:22

## 2014-08-11 NOTE — H&P (Signed)
PATIENT NAME:  Nicholas Leonard, Nicholas Leonard MR#:  [id number] DATE OF BIRTH:  1945-06-28  DATE OF ADMISSION:  05/08/2014  REFERRING PHYSICIAN: Enedina Finnerandolph N. Manson PasseyBrown, MD  PRIMARY CARE PHYSICIAN: Curtis SitesBert J. Klein III, MD   ADMISSION DIAGNOSIS: Acute kidney injury.   HISTORY OF PRESENT ILLNESS: This 69 year old Caucasian male presents to the Emergency Department complaining of generalized weakness. The patient states that he has had some progressive lower extremity weakness for 2 weeks. He has had some difficulty going up stairs. He also feels like there is "a pit his stomach, that keeps going out". He says this happens sometimes while he is sleeping, and awakens him. He says he gasps for breath at the time, but does not specifically feel short of breath thereafter. He denies any chest pain, nausea and vomiting, fever, diaphoresis or lightheadedness.   In the Emergency Department, the patient was found to have stable vital signs, but significantly decreased kidney function. Due to these findings, and the patient's comorbidities, the Emergency Department staff called for admission.   REVIEW OF SYSTEMS:  CONSTITUTIONAL: The patient denies fever, but admits to generalized weakness.  EYES: Denies inflammation or blurred vision.  EARS, NOSE, AND THROAT: Denies tinnitus or sore throat.  RESPIRATORY: Denies cough or shortness of breath.  CARDIOVASCULAR: Denies chest pain, palpitations, or orthopnea. It sounds like the patient may be endorsing paroxysmal nocturnal dyspnea, but does not specifically feel short of breath after awakening with decreased respiratory effort.  GASTROINTESTINAL: Denies nausea, vomiting, diarrhea, or abdominal pain.  GENITOURINARY: Denies dysuria, increased frequency, or hesitancy of urination.  ENDOCRINE: Denies polyuria or polydipsia.  HEMATOLOGIC AND LYMPHATIC: Denies easy bruising or bleeding.  INTEGUMENT: Denies rashes or lesions.  NEUROLOGIC: Denies numbness in his extremities or dysarthria.   MUSCULOSKELETAL: Denies arthralgias or myalgias.  PSYCHIATRIC: Denies depression or suicidal ideation.   PAST MEDICAL HISTORY: Ischemic cardiomyopathy status post coronary artery bypass graft surgery in June 2015, coronary artery disease status post myocardial infarction at the age of 69, hypertension, hypothyroidism, gout, osteoarthritis, diabetes mellitus type 2, and history of PE and DVT.   PAST SURGICAL HISTORY: AICD and pacemaker placement, angioplasty, IVC filter placement, coronary artery bypass graft x4 vessels, and a cardiac valvular repair at [facility] performed at the same time as the bypass surgery.   SOCIAL HISTORY: The patient is a former smoker. He occasionally has a few beers to drink. He denies any illicit drug use.   FAMILY HISTORY: His mother and father are deceased of coronary artery disease and has an older sister who has a heart problems as well.   MEDICATIONS:  1.  Allopurinol 100 mg 1 tablet p.o. daily.  2.  Amiodarone 200 mg 1 tablet p.o. daily.  3.  Atorvastatin 80 mg 1 tablet p.o. at bedtime. 4.  Carvedilol 3.125 mg 1 tablet p.o. b.i.d.  5.  Colcrys 0.6 mg 1 tablet p.o. daily.  6.  Furosemide 80 mg 2 tablets p.o. daily.  7.  Glimepiride 1 mg 1 tablet p.o. daily.  8.  Klor-Con 20 mEq extended release tablet 2 tablets p.o. b.i.d.  9.  Levothyroxine 75 mcg 1 tablet p.o. daily.  10.  Warfarin 10 mg 1 tablet p.o. daily.   ALLERGIES: NIACIN, NIASPAN ER, AND CERTAIN TYPES OF TAPE.  PERTINENT LABORATORY RESULTS AND RADIOGRAPHIC FINDINGS: Serum glucose 175, BUN 38, creatinine 2.20, serum sodium 135, potassium is 4.5, chloride is 100, bicarbonate 25, calcium is 9.2. Troponin is 0.03. Thyroid stimulating hormone is 87.3. White blood cell count is 5.1,  hemoglobin 13.2, hematocrit 42.4, platelet count 205,000. MCV is 86, INR is 4.1.   Chest x-ray shows small right pleural effusion with mild right bibasilar airspace opacity, likely reflecting atelectasis. Pneumonia cannot be  completely excluded. There is mild cardiomegaly as well.   PHYSICAL EXAMINATION:  VITAL SIGNS: Temperature is 97.9 degrees Fahrenheit, pulse 87, respirations 18, blood pressure 124/92, pulse oximetry is 100% on room air.  GENERAL: The patient is alert and oriented x 3, in no apparent distress.  HEENT: Normocephalic, atraumatic. Pupils equal, round, and reactive to light and accommodation. Extraocular movements are intact. Mucous membranes are moist.  NECK: Trachea is midline. No adenopathy. Thyroid is nonpalpable and nontender.  CHEST: Symmetric and atraumatic, with a well-healed midline sternotomy scar.  CARDIOVASCULAR: Regular rate and rhythm. Normal S1, S2. No rubs, clicks, or murmurs appreciated.  LUNGS: Clear to auscultation bilaterally. Normal effort and excursion.  ABDOMEN: Positive bowel sounds. Soft, nontender, nondistended. No hepatosplenomegaly.  GENITOURINARY: Deferred.  MUSCULOSKELETAL: The patient moves all 4 extremities equally. I have not walked him, as he feels particularly weak, but he has good range of motion in all his extremities.  SKIN: No rashes or lesions. It is warm and dry.  EXTREMITIES: No clubbing, cyanosis, but there is nonpitting edema of the patient's lower extremities to the mid shin.  NEUROLOGIC: Cranial nerves II through XII are grossly intact.  PSYCHIATRIC: Mood is normal. Affect is congruent. The patient has good judgment and insight into his medical condition.   ASSESSMENT AND PLAN: This is a 69 year old male admitted for acute kidney injury.  1.  Acute renal failure. This is likely contributing to the patient's weakness and fatigue. He has been on a large dose of Lasix, started by his cardiologist. No doubt this maintains excellent euvolemia for the patient's heart; however, it appears he may be over diuresed at this time. Our plan is to gently hydrate the patient while maintaining his current Lasix dose. Once we have improved his prerenal kidney flow, we will  decrease his maintenance dose of Lasix slightly and allow the patient to continue to hydrate by mouth. I will also avoid nephrotoxic agents, as I have held the patient's Colcrys for now.  2.  Coronary artery disease. Continue aspirin and Plavix.  3.  Hypertension. Continue carvedilol.  4.  Diabetes mellitus type 2. Hold oral hypoglycemics at this time. We will place the patient on sliding scale insulin while hospitalized.  5.  Hypothyroidism. Increase Synthroid doses. The patient's TSH is dramatically elevated.  6.  Gout. Continue renal dose allopurinol. I have held Colcrys as stated above.  7.  Deep vein thrombosis prophylaxis. 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 The patient's inferior vena cava filter is also in place.  8.  Gastrointestinal prophylaxis. None, as the patient is not critically ill.   CODE STATUS: The patient is a Full Code.   TIME SPENT ON ADMISSION ORDERS AND PATIENT CARE: Approximately 40 minutes.    ____________________________ Kelton Pillar. Sheryle Hail, MD msd:MT D: 05/08/2014 08:53:36 ET T: 05/08/2014 09:16:54 ET JOB#: [id number]  cc: Kelton Pillar. Sheryle Hail, MD, <Dictator> Kelton Pillar Welton Bord MD ELECTRONICALLY SIGNED 05/15/2014 7:15

## 2014-08-11 NOTE — Consult Note (Signed)
PATIENT NAME:  Nicholas Leonard, Nicholas Leonard MR#:  [id number] DATE OF BIRTH:  1945-07-28  DATE OF CONSULTATION:  05/09/2014  REQUESTING PROVIDER: Ramonita Lab, MD   DICTATING CONSULTANT:  Lavone Orn, MD   CHIEF COMPLAINT: Hypothyroidism.   HISTORY OF PRESENT ILLNESS: This is a 69 year old male with multiple medical problems including multivessel coronary artery disease, history of v. fib/v. tach on chronic amiodarone, atrial fibrillation, congestive heart failure, and hypothyroidism, who was admitted on 05/08/2014 from the emergency department with a complaint of weakness. Initial findings notable for elevated creatinine of 2.20 and a TSH level of 87.3. The patient reports an approximately 5-8 year history of hypothyroidism. As an outpatient, he reports taking levothyroxine 75 mcg daily. He has a long history of elevated TSH levels dating back at least 7 months when TSH was 19.6, 4 months ago, TSH was 7.95, and 1 month ago, TSH was 55.71. Primary care physician contacted patient in late December and at that point, prescribed 100 mcg per day of levothyroxine. Seems the patient did not pick up that dose until just prior to this hospitalization and never started it. The patient pharmacy ([facility] on [address] was contacted. and they indicate that he did pick up a prescription for levothyroxine in the months of October, December, and January, all at 75 mcg per day. Each prescription was for a 30 day supply. Therefore, he did not pick up a dose in November. Per patient, he takes his levothyroxine daily when fasting. Takes it is separate from other medications and typically waits 45 -60 minutes before eating. States he has been very compliant with levothyroxine over the last 3 weeks. In the month of December, he estimates he may have missed 3 tablets only. He does report cold intolerance. He has had occasional constipation. He does report fatigue and generalized leg weakness. He reports 2 weeks of poor appetite.  He reports weight gain of 6 pounds over the last 6 weeks.   PAST MEDICAL HISTORY:  1. Hypothyroidism.  2. Multivessel coronary artery disease.  3. Ischemic cardiomyopathy.  4. History of acute myocardial infarction.  5. Hypertension.  6. Gout.  7. Osteoarthritis.  8. Type 2 diabetes.  9. History of PE.  10. History of DVT.  11. Stage III CKD (creatinine 1.4-2.0, EGFR 50s).  12. History of anemia.   PAST SURGICAL HISTORY:  1. Automatic implantable cardiac defibrillator and pacemaker placement.  2. History of PCI with angioplasty.  3. History of IVC filter placement.  4. Four vessel CABG with repair of mitral valve, 09/2003.   CURRENT MEDICATIONS:  1. Levothyroxine 50 mcg daily IV.  2. Allopurinol 100 mg daily.  3. Amiodarone 200 mg daily.  4. Atorvastatin 80 mg at bedtime.  5. Coreg 3.125 mg b.i.d.  6. Lasix 160 mg daily.  7. Coumadin 10 mg daily.  8. NovoLog insulin sliding scale.  9. Ambien 5 mg daily.   ALLERGIES: NIACIN, NIASPAN.    SOCIAL HISTORY: No tobacco use. Occasional alcohol use.   FAMILY HISTORY: Mother and father with history of heart disease. No known thyroid disease.   REVIEW OF SYSTEMS:  GENERAL: Weight gain as per history of present illness. No fevers.  HEENT: No blurred vision. No sore throat.  NECK: No dysphagia. No neck pain.  CARDIAC: No chest pain. No palpitations.  PULMONARY: Has shortness of breath. He reports no cough.  ABDOMEN: Reports decreased appetite. No nausea.  EXTREMITIES: Complains of lower extremity weakness. Denies lower extremity swelling.  SKIN: Denies recent  rash or skin changes.  ENDOCRINE: Reports intolerance to cold. Denies heat intolerance.  HEMATOLOGIC: Denies easy bruisability or recent bleeding.  GENITOURINARY: Denies dysuria or hematuria.  NEUROLOGIC: No headache or falls.   PHYSICAL EXAMINATION:  VITAL SIGNS: Height 71.9 inches, weight 216 pounds. Temperature 97.8, pulse 86, respirations 20, blood pressure 117/89.   GENERAL: Chronically ill-appearing white male in no acute distress.  HEENT: No proptosis, lid lag or stare. Oropharynx clear. Mucous membranes moist.  NECK: Supple. No palpable thyroid nodules. No neck tenderness. No goiter.  CARDIAC: Regular rate and rhythm. No audible murmur.  PULMONARY: Decreased breath sounds throughout. No wheeze. Fair inspiratory effort.  ABDOMEN: Diffusely soft and nontender, nondistended.  SKIN: Scant ecchymotic lesions on the upper extremities bilaterally.  EXTREMITIES: No peripheral edema is present. Normal motor tone. Large gouty tophi on bilateral olecranon and smaller ones over the patella bilaterally.  NEUROLOGIC: No dysarthria. No tremor.   LABORATORY: Glucose 108, BUN 43, creatinine 2.2, EGFR 38, FT4 of 0.81, TSH 87.3. CK total 99, troponin I 0.03, AST 60, ALT 65, alkaline phosphatase 149, albumin 3.5, INR 5.8, PT 50, total T3 48.   ASSESSMENT:  1. Hypothyroidism, uncontrolled.  2. Congestive heart failure.  3. Elevated liver function tests, may have a congestive hepatopathy.  4. History of atrial fibrillation.  5. Atherosclerotic cardiovascular disease.  6. Questionable medication compliance.   RECOMMENDATIONS:  1. Elevated TSH, likely due to noncompliance with component of decreased absorption in the setting of his heart failure, congestive hepatopathy, and likely and bowel edema. This may be able to be overcome with higher dosing of levothyroxine or getting it by the IV route. Hypothyroidism could be contributing to fluid retention and exacerbate heart failure. Long-standing nature of his uncontrolled hypothyroidism though, does strongly suggest a component of noncompliance, as does a conversation with the pharmacy when we were told he did not take or pick up dosage for the month of November. Additionally, although he was called in higher dose in December, he never picked up the 100 mcg per day dose.  2. Agree with current dosing of levothyroxine by  intravenous route, 50 mcg daily IV equivalent to 100 mcg daily p.o. This will be an increase from his typical outpatient dosing of 75 mcg daily. Would be a good dose for now. Would not increase dosage further for the next 3-4 weeks, given history of congestive heart failure. Likely would be more risk than benefit from increasing levothyroxine quickly to attain a normal TSH level.  3. Anticipate at discharge, sending out on 100 mcg per day of levothyroxine and reassessing T4 and TSH levels approximately monthly.  4. Counseled patient on the importance of daily compliance with levothyroxine. He is aware of how it should be taken on an empty stomach and waiting 30-60 minutes prior to eating or taking other medications.  5. I plan to reassess T4 in 1-2 days. Would follow T4 levels during hospitalization rather than TSH, as it is slower to equilibrate. Aim for a normal level T4.   Thank you for the kind request for consultation. I will follow along with you.  ____________________________ A. Lavone Orn, MD ams:ap D: 05/09/2014 13:56:58 ET T: 05/09/2014 14:13:53 ET JOB#: [id number]  cc: A. Lavone Orn, MD, <Dictator> Sherlon Handing MD ELECTRONICALLY SIGNED 05/10/2014 17:04

## 2014-08-15 ENCOUNTER — Encounter: Payer: Medicare Other | Attending: Surgery | Admitting: Surgery

## 2014-08-15 DIAGNOSIS — I87011 Postthrombotic syndrome with ulcer of right lower extremity: Secondary | ICD-10-CM | POA: Diagnosis not present

## 2014-08-15 DIAGNOSIS — I80291 Phlebitis and thrombophlebitis of other deep vessels of right lower extremity: Secondary | ICD-10-CM | POA: Insufficient documentation

## 2014-08-15 DIAGNOSIS — Z86711 Personal history of pulmonary embolism: Secondary | ICD-10-CM | POA: Diagnosis not present

## 2014-08-15 DIAGNOSIS — L97122 Non-pressure chronic ulcer of left thigh with fat layer exposed: Secondary | ICD-10-CM | POA: Insufficient documentation

## 2014-08-15 DIAGNOSIS — E039 Hypothyroidism, unspecified: Secondary | ICD-10-CM | POA: Diagnosis not present

## 2014-08-15 DIAGNOSIS — I5021 Acute systolic (congestive) heart failure: Secondary | ICD-10-CM | POA: Insufficient documentation

## 2014-08-15 DIAGNOSIS — E11622 Type 2 diabetes mellitus with other skin ulcer: Secondary | ICD-10-CM | POA: Insufficient documentation

## 2014-08-15 DIAGNOSIS — N183 Chronic kidney disease, stage 3 (moderate): Secondary | ICD-10-CM | POA: Insufficient documentation

## 2014-08-15 DIAGNOSIS — Z86718 Personal history of other venous thrombosis and embolism: Secondary | ICD-10-CM | POA: Diagnosis not present

## 2014-08-15 DIAGNOSIS — I87311 Chronic venous hypertension (idiopathic) with ulcer of right lower extremity: Secondary | ICD-10-CM | POA: Diagnosis not present

## 2014-08-16 NOTE — Progress Notes (Signed)
Nicholas Leonard ([id number]) Visit Report for 08/15/2014 Arrival Information Details Patient Name: Nicholas Leonard, Nicholas Leonard. Date of Service: 08/15/2014 1:00 PM Medical Record Number: [id number] Patient Account Number: [id number] Date of Birth/Sex: Aug 18, 1945 (69 y.o. Male) Treating RN: Clover Mealy, RN, BSN, [place] Sink Primary Care Physician: Daniel Nones Other Clinician: Referring Physician: Daniel Nones Treating Physician/Extender: Rudene Re in Treatment: 5 Visit Information History Since Last Visit Any new allergies or adverse reactions: No Patient Arrived: Ambulatory Had a fall or experienced change in No Arrival Time: 13:14 activities of daily living that may affect Accompanied By: self risk of falls: Transfer Assistance: None Signs or symptoms of abuse/neglect since last No Patient Identification Verified: Yes visito Secondary Verification Process Yes Hospitalized since last visit: No Completed: Has Dressing in Place as Prescribed: Yes Patient Has Alerts: Yes Pain Present Now: No Patient Alerts: Patient on Blood Thinner coumadin DMII ABI L 1.75 R 1.55 Electronic Signature(s) Signed: 08/15/2014 5:21:09 PM By: Elpidio Eric BSN, RN Entered By: Elpidio Eric on 08/15/2014 13:17:03 Nicholas Leonard ([id number]) -------------------------------------------------------------------------------- Encounter Discharge Information Details Patient Name: Nicholas Leonard. Date of Service: 08/15/2014 1:00 PM Medical Record Number: [id number] Patient Account Number: [id number] Date of Birth/Sex: 12-Nov-1945 (69 y.o. Male) Treating RN: Clover Mealy, RN, BSN, [place] Sink Primary Care Physician: Daniel Nones Other Clinician: Referring Physician: Daniel Nones Treating Physician/Extender: Rudene Re in Treatment: 5 Encounter Discharge Information Items Schedule Follow-up Appointment: No Medication Reconciliation completed No and provided to Patient/Care Nicholas Leonard: Provided on Clinical Summary of  Care: 08/15/2014 Form Type Recipient Paper Patient FP Electronic Signature(s) Signed: 08/15/2014 1:44:46 PM By: Gwenlyn Perking Entered By: Gwenlyn Perking on 08/15/2014 13:44:46 Nicholas Leonard, Nicholas Leonard ([id number]) -------------------------------------------------------------------------------- Lower Extremity Assessment Details Patient Name: Nicholas Leonard, Nicholas Leonard. Date of Service: 08/15/2014 1:00 PM Medical Record Number: [id number] Patient Account Number: [id number] Date of Birth/Sex: [birth year] (69 y.o. Male) Treating RN: Clover Mealy, RN, BSN, [place]ink Primary Care Physician: Daniel Nones Other Clinician: Referring Physician: Daniel Nones Treating Physician/Extender: Rudene Re in Treatment: 5 Edema Assessment Assessed: [Left: No] [Right: No] Leonard[Left: dema] [Right: :] Calf Left: Right: Point of Measurement: 39 cm From Medial Instep cm 37.8 cm Ankle Left: Right: Point of Measurement: 12 cm From Medial Instep cm 24.5 cm Vascular Assessment Claudication: Claudication Assessment [Right:None] Pulses: Posterior Tibial Dorsalis Pedis Palpable: [Right:Yes] Extremity colors, hair growth, and conditions: Extremity Color: [Right:Mottled] Hair Growth on Extremity: [Right:No] Temperature of Extremity: [Right:Warm] Capillary Refill: [Right:< 3 seconds] Dependent Rubor: [Right:No] Blanched when Elevated: [Right:No] Lipodermatosclerosis: [Right:No] Toe Nail Assessment Left: Right: Thick: Yes Discolored: Yes Deformed: No Improper Length and Hygiene: No Nicholas Leonard ([id number]) Electronic Signature(s) Signed: 08/15/2014 5:21:09 PM By: Elpidio Eric BSN, RN Entered By: Elpidio Eric on 08/15/2014 13:19:32 Nicholas Leonard ([id number]) -------------------------------------------------------------------------------- Multi Wound Chart Details Patient Name: Nicholas Leonard. Date of Service: 08/15/2014 1:00 PM Medical Record Number: [id number] Patient Account Number: [id number] Date of Birth/Sex:  May 08, 1945 (69 y.o. 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] [N/A:N/A] Secondary Etiology: [1:Diabetic Wound/Ulcer of N/A the Lower Extremity] [N/A:N/A] Comorbid History: [1:Congestive Heart Failure, Congestive Heart Failure,  Coronary Artery Disease, Coronary Artery Disease, Hypertension, Type II Diabetes, Gout, Neuropathy] [2:Hypertension, Type II Diabetes, Gout, Neuropathy] [N/A:N/A] Date Acquired: [1:06/10/2014] [2:07/29/2014] [N/A:N/A] Weeks of Treatment: [1:5] [2:2] [N/A:N/A] Wound Status: [1:Open] [2:Open] [N/A:N/A] Measurements L x W x D 2.5x2.2x0.8 [2:0.7x0.7x0.1] [N/A:N/A] (cm) Area (cm) : [1:4.32] [2:0.385] [N/A:N/A] Volume (cm) : [1:3.456] [2:0.038] [N/A:N/A] % Reduction in Area: [1:-243.70%] [2:50.50%] [N/A:N/A] % Reduction in Volume: -2642.90% [2:51.30%] [N/A:N/A] Classification: [1:Partial Thickness] [2:Grade 1] [N/A:N/A] HBO Classification: [1:Grade 1] [2:N/A] [N/A:N/A] Exudate Amount: [1:Large] [2:Small] [N/A:N/A] Exudate Type: [1:Serous] [2:Serous] [N/A:N/A] Exudate Color: [1:amber] [2:amber] [N/A:N/A] Wound Margin: [1:Distinct, outline attached Flat and Intact] [N/A:N/A] Granulation Amount: [1:Small (1-33%)] [2:Medium (34-66%)] [N/A:N/A] Granulation Quality: [1:Red] [2:Red] [N/A:N/A] Necrotic Amount: [1:Large (67-100%)] [2:Medium (34-66%)] [N/A:N/A] Necrotic Tissue:  Eschar, Adherent Slough Eschar, Adherent Slough N/A Exposed Structures: Fascia: No Fascia: No N/A Fat: No Fat: No Tendon: No Tendon: No Muscle: No Muscle: No Joint: No Joint: No Bone: No Bone: No Limited to Skin Limited to Skin Breakdown Breakdown Epithelialization: None Small (1-33%) N/A Debridement: Debridement ([id number](11042- N/A N/A 11047) Time-Out Taken: Yes N/A N/A Pain Control: Lidocaine 5% topical N/A N/A ointment Tissue Debrided: Necrotic/Eschar, N/A N/A Fibrin/Slough, Subcutaneous Level: Skin/Subcutaneous N/A N/A Tissue Debridement Area (sq 5.5 N/A N/A cm): Instrument: Curette N/A N/A Bleeding: Minimum N/A N/A Hemostasis Achieved: Pressure N/A N/A Procedural Pain: 2 N/A N/A Post Procedural Pain: 0 N/A N/A Debridement Treatment Procedure was tolerated N/A N/A Response: well Post Debridement 2.5x2.2x0.8 N/A N/A Measurements L x W x D (cm) Post Debridement 3.456 N/A N/A Volume: (cm) Periwound Skin Texture: Edema: Yes Edema: No N/A Excoriation: No Excoriation: No Induration: No Induration: No Callus: No Callus: No Crepitus: No Crepitus: No Fluctuance: No Fluctuance: No Friable: No Friable: No Rash: No Rash: No Scarring: No Scarring: No Periwound Skin Moist: Yes Moist: Yes N/A Moisture: Maceration: No Maceration: No Dry/Scaly: No Dry/Scaly: No Periwound Skin Color: Erythema: Yes Erythema: Yes N/A Atrophie Blanche: No Atrophie Blanche: No Cyanosis: No Cyanosis: No Ecchymosis: No Ecchymosis: No Jacqualine MauRKER, Nicholas Leonard Leonard. ([id number]) Hemosiderin Staining: No Hemosiderin Staining: No Mottled: No Mottled: No Pallor: No Pallor: No Rubor: No Rubor: No Erythema Location: Circumferential Circumferential N/A Temperature: No Abnormality No Abnormality N/A Tenderness on Yes Yes N/A Palpation: Wound Preparation: Ulcer Cleansing: Ulcer Cleansing: N/A Rinsed/Irrigated with Rinsed/Irrigated with Saline Saline Topical Anesthetic Topical  Anesthetic Applied: Other: lidocaine Applied: Other: lidocaine 4% 4% Procedures Performed: Debridement N/A N/A Treatment Notes Electronic Signature(s) Signed: 08/15/2014 1:47:33 PM By: Curtis Sitesorthy, Joanna Entered By: Curtis Sitesorthy, Joanna on 08/15/2014 13:47:33 Nicholas Leonard, Nicholas FettersFREDDIE Leonard. ([id number]) -------------------------------------------------------------------------------- Multi-Disciplinary Care Plan Details Patient Name: Nicholas LedgerRKER, Ashaad Leonard. Date of Service: 08/15/2014 1:00 PM Medical Record Number: [id number] Patient Account Number: [id number] Date of Birth/Sex: [birth year](68 y.o. Male) Treating RN: Curtis Sitesorthy, Joanna Primary Care Physician: Daniel NonesKLEIN, BERT Other Clinician: Referring Physician: Daniel NonesKLEIN, BERT Treating Physician/Extender: Rudene ReBritto, Errol Weeks in Treatment: 5 Active Inactive Nutrition Nursing Diagnoses: Impaired glucose control: actual or potential Potential for alteratiion in Nutrition/Potential for imbalanced nutrition Goals: Patient/caregiver verbalizes understanding of need to maintain therapeutic glucose control per primary care physician Date Initiated: 07/09/2014 Goal Status: Active Patient/caregiver will maintain therapeutic glucose control Date Initiated: 07/09/2014 Goal Status: Active Interventions: Assess patient nutrition upon admission and as needed per policy Provide education on elevated blood sugars and impact on wound healing Provide education on nutrition Treatment Activities: Education provided on Nutrition : 07/09/2014 Notes: Orientation to the Wound Care Program Nursing Diagnoses: Knowledge deficit related to the wound healing center program Goals: Patient/caregiver will verbalize understanding of  the Wound Healing Center Program Date Initiated: 07/09/2014 Goal Status: Active Interventions: Provide education on orientation to the wound center Nicholas LedgerRKER, Jaze Leonard. ([id number]) Notes: Venous Leg Ulcer Nursing Diagnoses: Knowledge deficit related to disease process  and management Potential for venous Insuffiency (use before diagnosis confirmed) Goals: Patient will maintain optimal edema control Date Initiated: 07/09/2014 Goal Status: Active Verify adequate tissue perfusion prior to therapeutic compression application Date Initiated: 07/09/2014 Goal Status: Active Interventions: Assess peripheral edema status every visit. Compression as ordered Provide education on venous insufficiency Treatment Activities: Test ordered outside of clinic : 08/15/2014 Therapeutic compression applied : 08/15/2014 Notes: Wound/Skin Impairment Nursing Diagnoses: Knowledge deficit related to ulceration/compromised skin integrity Goals: Patient/caregiver will verbalize understanding of skin care regimen Date Initiated: 07/09/2014 Goal Status: Active Ulcer/skin breakdown will heal within 14 weeks Date Initiated: 07/09/2014 Goal Status: Active Interventions: Assess patient/caregiver ability to obtain necessary supplies Assess patient/caregiver ability to perform ulcer/skin care regimen upon admission and as needed Assess ulceration(s) every visit Provide education on ulcer and skin care Nicholas LedgerRKER, Jakob Leonard. ([id number]) Treatment Activities: Patient referred to home care : 08/15/2014 Skin care regimen initiated : 08/15/2014 Topical wound management initiated : 08/15/2014 Notes: Electronic Signature(s) Signed: 08/15/2014 1:46:24 PM By: Curtis Sitesorthy, Joanna Entered By: Curtis Sitesorthy, Joanna on 08/15/2014 13:46:24 Buffa, Nicholas FettersFREDDIE Leonard. ([id number]) -------------------------------------------------------------------------------- Pain Assessment Details Patient Name: Nicholas LedgerPARKER, Nicholas Leonard. Date of Service: 08/15/2014 1:00 PM Medical Record Number: [id number] Patient Account Number: [id number] Date of Birth/Sex: [birth year](68 y.o. Male) Treating RN: Clover MealyAfful, RN, BSN, [place] Primary Care Physician: Daniel NonesKLEIN, BERT Other Clinician: Referring Physician: Daniel NonesKLEIN, BERT Treating Physician/Extender: Rudene ReBritto,  Errol Weeks in Treatment: 5 Active Problems Location of Pain Severity and Description of Pain Patient Has Paino Yes Site Locations Rate the pain. Current Pain Level: 5 Pain Management and Medication Current Pain Management: How does your pain impact your activities of daily livingo Sleep: Yes Bathing: Yes Appetite: Yes Relationship With Others: Yes Bladder Continence: Yes Emotions: Yes Bowel Continence: Yes Work: Yes Toileting: Yes Drive: Yes Dressing: Yes Hobbies: Yes Electronic Signature(s) Signed: 08/15/2014 5:21:09 PM By: Elpidio EricAfful, Rita BSN, RN Entered By: Elpidio EricAfful, Rita on 08/15/2014 13:17:25 Nicholas Leonard, Nicholas FettersFREDDIE Leonard. ([id number]) -------------------------------------------------------------------------------- Wound Assessment Details Patient Name: Nicholas LedgerPARKER, Coree Leonard. Date of Service: 08/15/2014 1:00 PM Medical Record Number: [id number] Patient Account Number: [id number] Date of Birth/Sex: [birth year](68 y.o. Male) Treating RN: Clover MealyAfful, RN, BSN, Rita Primary Care Physician: Daniel NonesKLEIN, BERT Other Clinician: Referring Physician: Daniel NonesKLEIN, BERT Treating Physician/Extender: Rudene ReBritto, Errol Weeks in Treatment: 5 Wound Status Wound Number: 1 Primary Venous Leg Ulcer Etiology: Wound Location: Right Lower Leg - Medial Secondary Diabetic Wound/Ulcer of the Lower Wounding Event: Gradually Appeared Etiology: Extremity Date Acquired: 06/10/2014 Wound Open Weeks Of Treatment: 5 Status: Clustered Wound: No Comorbid Congestive Heart Failure, Coronary History: Artery Disease, Hypertension, Type II Diabetes, Gout, Neuropathy Photos Photo Uploaded By: Elpidio EricAfful, Rita on 08/15/2014 17:10:03 Wound Measurements Length: (cm) 2.5 Width: (cm) 2.2 Depth: (cm) 0.8 Area: (cm) 4.32 Volume: (cm) 3.456 % Reduction in Area: -243.7% % Reduction in Volume: -2642.9% Epithelialization: None Tunneling: No Undermining: No Wound Description Classification: Partial Thickness Diabetic Severity (Wagner): Grade  1 Wound Margin: Distinct, outline attach Exudate Amount: Large Exudate Type: Serous Exudate Color: amber Foul Odor After Cleansing: No ed Wound Bed Granulation Amount: Small (1-33%) Exposed Structure Nicholas Leonard, Nicholas Leonard. ([id number]) Granulation Quality: Red Fascia Exposed: No Necrotic Amount: Large (67-100%) Fat Layer Exposed: No Necrotic Quality: Eschar, Adherent Slough Tendon Exposed: No Muscle Exposed: No Joint Exposed: No Bone Exposed: No Limited to Skin Breakdown Periwound  Skin Texture Texture Color No Abnormalities Noted: No No Abnormalities Noted: No Callus: No Atrophie Blanche: No Crepitus: No Cyanosis: No Excoriation: No Ecchymosis: No Fluctuance: No Erythema: Yes Friable: No Erythema Location: Circumferential Induration: No Hemosiderin Staining: No Localized Edema: Yes Mottled: No Rash: No Pallor: No Scarring: No Rubor: No Moisture Temperature / Pain No Abnormalities Noted: No Temperature: No Abnormality Dry / Scaly: No Tenderness on Palpation: Yes Maceration: No Moist: Yes Wound Preparation Ulcer Cleansing: Rinsed/Irrigated with Saline Topical Anesthetic Applied: Other: lidocaine 4%, Electronic Signature(s) Signed: 08/15/2014 5:21:09 PM By: Elpidio Eric BSN, RN Entered By: Elpidio Eric on 08/15/2014 13:26:51 Nicholas Leonard ([id number]) -------------------------------------------------------------------------------- Wound Assessment Details Patient Name: Nicholas Leonard, Nicholas Leonard. Date of Service: 08/15/2014 1:00 PM Medical Record Number: [id number] Patient Account Number: [id number] Date of Birth/Sex: [birth year] (69 y.o. Male) Treating RN: Clover Mealy, RN, BSN, Rita Primary Care Physician: Daniel Nones Other Clinician: Referring Physician: Daniel Nones Treating Physician/Extender: Rudene Re in Treatment: 5 Wound Status Wound Number: 2 Primary Diabetic Wound/Ulcer of the Lower Etiology: Extremity Wound Location: Left Upper Leg - Anterior Wound  Open Wounding Event: Gradually Appeared Status: Date Acquired: 07/29/2014 Comorbid Congestive Heart Failure, Coronary Weeks Of Treatment: 2 History: Artery Disease, Hypertension, Type II Clustered Wound: No Diabetes, Gout, Neuropathy Photos Photo Uploaded By: Elpidio Eric on 08/15/2014 17:10:03 Wound Measurements Length: (cm) 0.7 Width: (cm) 0.7 Depth: (cm) 0.1 Area: (cm) 0.385 Volume: (cm) 0.038 % Reduction in Area: 50.5% % Reduction in Volume: 51.3% Epithelialization: Small (1-33%) Tunneling: No Undermining: No Wound Description Classification: Grade 1 Foul Odor Aft Wound Margin: Flat and Intact Exudate Amount: Small Exudate Type: Serous Exudate Color: amber er Cleansing: No Wound Bed Granulation Amount: Medium (34-66%) Exposed Structure Granulation Quality: Red Fascia Exposed: No Necrotic Amount: Medium (34-66%) Fat Layer Exposed: No Necrotic Quality: Eschar, Adherent Slough Tendon Exposed: No Nicholas Leonard, Nicholas Leonard. ([id number]) Muscle Exposed: No Joint Exposed: No Bone Exposed: No Limited to Skin Breakdown Periwound Skin Texture Texture Color No Abnormalities Noted: No No Abnormalities Noted: No Callus: No Atrophie Blanche: No Crepitus: No Cyanosis: No Excoriation: No Ecchymosis: No Fluctuance: No Erythema: Yes Friable: No Erythema Location: Circumferential Induration: No Hemosiderin Staining: No Localized Edema: No Mottled: No Rash: No Pallor: No Scarring: No Rubor: No Moisture Temperature / Pain No Abnormalities Noted: No Temperature: No Abnormality Dry / Scaly: No Tenderness on Palpation: Yes Maceration: No Moist: Yes Wound Preparation Ulcer Cleansing: Rinsed/Irrigated with Saline Topical Anesthetic Applied: Other: lidocaine 4%, Electronic Signature(s) Signed: 08/15/2014 5:21:09 PM By: Elpidio Eric BSN, RN Entered By: Elpidio Eric on 08/15/2014 13:27:34 Nicholas Leonard  ([id number]) -------------------------------------------------------------------------------- Vitals Details Patient Name: Nicholas Leonard. Date of Service: 08/15/2014 1:00 PM Medical Record Number: [id number] Patient Account Number: [id number] Date of Birth/Sex: 1945/11/25 (70 y.o. Male) Treating RN: Clover Mealy, RN, BSN, Rita Primary Care Physician: Daniel Nones Other Clinician: Referring Physician: Daniel Nones Treating Physician/Extender: Rudene Re in Treatment: 5 Vital Signs Time Taken: 13:17 Temperature (F): 97.6 Height (in): 72 Pulse (bpm): 59 Weight (lbs): 215 Respiratory Rate (breaths/min): 16 Body Mass Index (BMI): 29.2 Blood Pressure (mmHg): 97/74 Reference Range: 80 - 120 mg / dl Electronic Signature(s) Signed: 08/15/2014 5:21:09 PM By: Elpidio Eric BSN, RN Entered By: Elpidio Eric on 08/15/2014 13:18:16

## 2014-08-16 NOTE — Progress Notes (Signed)
QUINTRELL, BAZE ([id number]) Visit Report for 08/15/2014 Chief Complaint Document Details Patient Name: Nicholas Leonard, Nicholas Leonard. Date of Service: 08/15/2014 1:00 PM Medical Record Number: [id number] Patient Account Number: [id number] Date of Birth/Sex: Jan 07, 1946 (69 y.o. Male) Treating RN: Clover Mealy, RN, BSN, [place] Sink Primary Care Physician: Daniel Nones Other Clinician: Referring Physician: Daniel Nones Treating Physician/Extender: Rudene Re in Treatment: 5 Information Obtained from: Patient Chief Complaint Patient presents to the wound care center for a consult due non healing wound ulcer on the right lower limb which was noted to be there for about 3 weeks now. he is also had swelling of both lower extremities for about 2 months now. Electronic Signature(s) Signed: 08/15/2014 4:59:19 PM By: Evlyn Kanner MD, FACS Entered By: Evlyn Kanner on 08/15/2014 13:46:42 Nicholas Leonard ([id number]) -------------------------------------------------------------------------------- Debridement Details Patient Name: Nicholas Leonard, Nicholas Leonard. Date of Service: 08/15/2014 1:00 PM Medical Record Number: [id number] Patient Account Number: [id number] Date of Birth/Sex: May 09, 1945 (69 y.o. Male) Treating RN: Clover Mealy, RN, BSN, [place] Primary Care Physician: Daniel Nones Other Clinician: Referring Physician: Daniel Nones Treating Physician/Extender: Rudene Re in Treatment: 5 Debridement Performed for Wound #1 Right,Medial Lower Leg Assessment: Performed By: Physician Tristan Schroeder., MD Debridement: Debridement Pre-procedure Yes Verification/Time Out Taken: Start Time: 01:30 Pain Control: Lidocaine 5% topical ointment Level: Skin/Subcutaneous Tissue Total Area Debrided (L x 2.5 (cm) x 2.2 (cm) = 5.5 (cm) W): Tissue and other Viable, Non-Viable, Eschar, Fibrin/Slough, Subcutaneous material debrided: Instrument: Curette Bleeding: Minimum Hemostasis Achieved: Pressure End Time: 01:34 Procedural  Pain: 2 Post Procedural Pain: 0 Response to Treatment: Procedure was tolerated well Post Debridement Measurements of Total Wound Length: (cm) 2.5 Width: (cm) 2.2 Depth: (cm) 0.8 Volume: (cm) 3.456 Electronic Signature(s) Signed: 08/15/2014 4:59:19 PM By: Evlyn Kanner MD, FACS Signed: 08/15/2014 5:21:09 PM By: Elpidio Eric BSN, RN Entered By: Evlyn Kanner on 08/15/2014 13:45:47 Nicholas Leonard ([id number]) -------------------------------------------------------------------------------- HPI Details Patient Name: Nicholas Leonard, Nicholas Leonard. Date of Service: 08/15/2014 1:00 PM Medical Record Number: [id number] Patient Account Number: [id number] Date of Birth/Sex: 16-Aug-1945 (69 y.o. Male) Treating RN: Clover Mealy, RN, BSN, [place] Sink Primary Care Physician: Daniel Nones Other Clinician: Referring Physician: Daniel Nones Treating Physician/Extender: Rudene Re in Treatment: 5 History of Present Illness HPI Description: 69 year old gentleman who is recently discharged from [facility] and was admitted between 3 18-3/22 by his primary care physician Dr. Daniel Nones. He has adult onset diabetes and was recently admitted for acute on chronic left-sided systolic congestive heart failure, acute on chronic renal failure, coronary artery disease, ischemic cardiomyopathy, ventricular fibrillation status post AICD implantation, history of atrial fibrillation on Coumadin therapy, hypothyroidism, history of venous stasis ulcer, pulmonary embolism with deep vein thrombosis on chronic Coumadin therapy, osteoarthritis and gout. He does not smoke any longer and occasionally has alcohol. as not been compliant with getting his blood sugars checked and does not know the last time he had a hemoglobin A1c done. Does not recall when the last ultrasound of his lower extremities was done but he does say that he has had clots in his legs. 07/30/2014 the patient is back to [place] after a recent admission and  discharge from the hospital and he was there between 07/19/2014 and 07/26/2014. Reviewed these notes and his final diagnosis was #1 elevated INR, #2 acute on chronic left-sided systolic congestive heart failure, #3 hypothyroidism, #4 gout, #5 chronic kidney disease stage III, #6 acute renal failure, #7 carotid artery disease, #8 history of pulmonary embolism and DVT, #9 adult onset diabetes mellitus.  During the admission he was corrected with giving vitamin K and due to his difficulty with Coumadin he was changed over to Eliquis. The patient also was found to have diffuse edema and there was evidence of ascites on abdominal ultrasound and was placed on a Lasix drip by nephrologist. His just discharge medications were noted by me. they have been using Santyl on his right lower extremity wound and he has been doing this dressing as often as the nurse comes in. he has also developed a ulcer of the left thigh and this has been there for about 2 weeks and has some necrosis of skin over there. Electronic Signature(s) Signed: 08/15/2014 4:59:19 PM By: Evlyn Kanner MD, FACS Entered By: Evlyn Kanner on 08/15/2014 13:47:03 Nicholas Leonard ([id number]) -------------------------------------------------------------------------------- Physical Exam Details Patient Name: Nicholas Leonard, Nicholas Leonard. Date of Service: 08/15/2014 1:00 PM Medical Record Number: [id number] Patient Account Number: [id number] Date of Birth/Sex: 10/10/1945 (69 y.o. Male) Treating RN: Clover Mealy, RN, BSN, [place] Sink Primary Care Physician: Daniel Nones Other Clinician: Referring Physician: Daniel Nones Treating Physician/Extender: Rudene Re in Treatment: 5 Constitutional . Pulse regular. Respirations normal and unlabored. Afebrile. . Eyes Nonicteric. Reactive to light. Ears, Nose, Mouth, and Throat Lips, teeth, and gums WNL.Marland Kitchen Moist mucosa without lesions . Neck supple and nontender. No palpable supraclavicular or cervical adenopathy.  Normal sized without goiter. Respiratory WNL. No retractions.. Cardiovascular Pedal Pulses WNL. significant pedal edema +2. Integumentary (Hair, Skin) the ulcerated area has some slough on its depth and this will be sharply debrided. The left thigh region looks very good and clean.Marland Kitchen Psychiatric Judgement and insight Intact.. No evidence of depression, anxiety, or agitation.. Electronic Signature(s) Signed: 08/15/2014 4:59:19 PM By: Evlyn Kanner MD, FACS Entered By: Evlyn Kanner on 08/15/2014 13:55:07 Nicholas Leonard ([id number]) -------------------------------------------------------------------------------- Physician Orders Details Patient Name: Nicholas Leonard, Nicholas Leonard. Date of Service: 08/15/2014 1:00 PM Medical Record Number: [id number] Patient Account Number: [id number] Date of Birth/Sex: 27-Jun-1945 (69 y.o. 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 Wound #2 Left,Anterior Upper Leg o Clean wound with Normal Saline. Anesthetic Wound #1 Right,Medial Lower Leg o Topical Lidocaine 4% cream applied to wound bed prior to debridement Wound #2 Left,Anterior Upper Leg o Topical Lidocaine 4% cream applied to wound bed  prior to debridement Skin Barriers/Peri-Wound Care Wound #1 Right,Medial Lower Leg o Skin Prep Wound #2 Left,Anterior Upper Leg o Skin Prep Primary Wound Dressing Wound #1 Right,Medial Lower Leg o Prisma Ag - or collagen with silver equivalent Wound #2 Left,Anterior Upper Leg Goens, Keian E. ([id number]) o Prisma Ag - or collagen with silver equivalent Secondary Dressing Wound #1 Right,Medial Lower Leg o Boardered Foam Dressing Wound #2 Left,Anterior Upper Leg o Boardered Foam Dressing Dressing Change Frequency Wound #1 Right,Medial Lower Leg o Change dressing every other day. Wound #2 Left,Anterior Upper Leg o Change dressing every other day. Follow-up Appointments Wound #1 Right,Medial Lower  Leg o Return Appointment in 1 week. Wound #2 Left,Anterior Upper Leg o Return Appointment in 1 week. Home Health Wound #1 Right,Medial Lower Leg o Continue Home Health Visits o Home Health Nurse may visit PRN to address patientos wound care needs. o FACE TO FACE ENCOUNTER: MEDICARE and MEDICAID PATIENTS: I certify that this patient is under my care and that I had a face-to-face encounter that meets the physician face-to-face encounter requirements with this patient on this date. The encounter with the patient was in whole or in part for the following MEDICAL CONDITION: (primary reason for Home Healthcare) MEDICAL NECESSITY: I certify, that based on my findings, NURSING services are a medically necessary home health service. HOME BOUND STATUS: I certify that my clinical findings support that this patient is homebound (i.e., Due to illness or injury, pt requires aid of supportive devices such as crutches, cane, wheelchairs, walkers, the use of special transportation or the assistance of another person to leave their place of residence. There is a normal inability to leave the home and doing so requires considerable and taxing effort. Other absences are for  medical reasons / religious services and are infrequent or of short duration when for other reasons). o If current dressing causes regression in wound condition, may D/C ordered dressing product/s and apply Normal Saline Moist Dressing daily until next Wound Healing Center / Other MD appointment. Notify Wound Healing Center of regression in wound condition at [phone]. o Please direct any NON-WOUND related issues/requests for orders to patient's Primary Care Physician Wound #2 Left,Anterior Upper Leg o Continue Home Health Visits o Home Health Nurse may visit PRN to address patientos wound care needs. Nicholas LedgerRKER, Kamarius E. ([id number]) o FACE TO FACE ENCOUNTER: MEDICARE and MEDICAID PATIENTS: I certify that this patient is under my care and that I had a face-to-face encounter that meets the physician face-to-face encounter requirements with this patient on this date. The encounter with the patient was in whole or in part for the following MEDICAL CONDITION: (primary reason for Home Healthcare) MEDICAL NECESSITY: I certify, that based on my findings, NURSING services are a medically necessary home health service. HOME BOUND STATUS: I certify that my clinical findings support that this patient is homebound (i.e., Due to illness or injury, pt requires aid of supportive devices such as crutches, cane, wheelchairs, walkers, the use of special transportation or the assistance of another person to leave their place of residence. There is a normal inability to leave the home and doing so requires considerable and taxing effort. Other absences are for medical reasons / religious services and are infrequent or of short duration when for other reasons). o If current dressing causes regression in wound condition, may D/C ordered dressing product/s and apply Normal Saline Moist Dressing daily until next Wound Healing Center / Other MD appointment. Notify Wound Healing Center of regression in  wound condition at [phone]. o Please direct any NON-WOUND related issues/requests for orders to patient's Primary Care Physician Electronic Signature(s) Signed: 08/15/2014 1:52:32 PM By: Curtis Sitesorthy, Joanna Signed: 08/15/2014 4:59:19 PM By: Evlyn KannerBritto, Kyann Heydt MD, FACS Entered By: Curtis Sitesorthy, Joanna on 08/15/2014 13:52:32 Hanger, Beatrix FettersFREDDIE E. ([id number]) -------------------------------------------------------------------------------- Problem List Details Patient Name: Nicholas LedgerRKER, Nicholas E. Date of Service: 08/15/2014 1:00 PM Medical Record Number: [id number] Patient Account Number: [id number] Date of Birth/Sex: Nov 12, 1945 95(68 y.o. Male) Treating RN: Clover MealyAfful, RN, BSN, [place] Primary Care Physician: Daniel NonesKLEIN, BERT Other Clinician: Referring Physician: Daniel NonesKLEIN, BERT Treating Physician/Extender: Rudene ReBritto, [place] Kohan Weeks in Treatment: 5 Active Problems ICD-10 Encounter Code Description  Active Date Diagnosis E11.622 Type 2 diabetes mellitus with other skin ulcer 07/09/2014 Yes I50.21 Acute systolic (congestive) heart failure 07/09/2014 Yes I80.291 Phlebitis and thrombophlebitis of other deep vessels of 07/09/2014 Yes right lower extremity I87.311 Chronic venous hypertension (idiopathic) with ulcer of 07/09/2014 Yes right lower extremity I87.011 Postthrombotic syndrome with ulcer of right lower 07/09/2014 Yes extremity L97.122 Non-pressure chronic ulcer of left thigh with fat layer 07/30/2014 Yes exposed Inactive Problems Resolved Problems Electronic Signature(s) Signed: 08/15/2014 4:59:19 PM By: Evlyn Kanner MD, FACS Entered By: Evlyn Kanner on 08/15/2014 13:50:40 Nicholas Leonard ([id number]) -------------------------------------------------------------------------------- Progress Note Details Patient Name: Nicholas Leonard. Date of Service: 08/15/2014 1:00 PM Medical Record Number: [id number] Patient Account Number: [id number] Date of Birth/Sex: 08/06/1945 (69 y.o. Male) Treating RN: Clover Mealy, RN, BSN, [place] Sink Primary  Care Physician: Daniel Nones Other Clinician: Referring Physician: Daniel Nones Treating Physician/Extender: Rudene Re in Treatment: 5 Subjective Chief Complaint Information obtained from Patient Patient presents to the wound care center for a consult due non healing wound ulcer on the right lower limb which was noted to be there for about 3 weeks now. he is also had swelling of both lower extremities for about 2 months now. History of Present Illness (HPI) 69 year old gentleman who is recently discharged from [facility] and was admitted between 3 18-3/22 by his primary care physician Dr. Daniel Nones. He has adult onset diabetes and was recently admitted for acute on chronic left-sided systolic congestive heart failure, acute on chronic renal failure, coronary artery disease, ischemic cardiomyopathy, ventricular fibrillation status post AICD implantation, history of atrial fibrillation on Coumadin therapy, hypothyroidism, history of venous stasis ulcer, pulmonary embolism with deep vein thrombosis on chronic Coumadin therapy, osteoarthritis and gout. He does not smoke any longer and occasionally has alcohol. as not been compliant with getting his blood sugars checked and does not know the last time he had a hemoglobin A1c done. Does not recall when the last ultrasound of his lower extremities was done but he does say that he has had clots in his legs. 07/30/2014 the patient is back to [place] after a recent admission and discharge from the hospital and he was there between 07/19/2014 and 07/26/2014. Reviewed these notes and his final diagnosis was #1 elevated INR, #2 acute on chronic left-sided systolic congestive heart failure, #3 hypothyroidism, #4 gout, #5 chronic kidney disease stage III, #6 acute renal failure, #7 carotid artery disease, #8 history of pulmonary embolism and DVT, #9 adult onset diabetes mellitus. During the admission he was corrected with giving  vitamin K and due to his difficulty with Coumadin he was changed over to Eliquis. The patient also was found to have diffuse edema and there was evidence of ascites on abdominal ultrasound and was placed on a Lasix drip by nephrologist. His just discharge medications were noted by me. they have been using Santyl on his right lower extremity wound and he has been doing this dressing as often as the nurse comes in. he has also developed a ulcer of the left thigh and this has been there for about 2 weeks and has some necrosis of skin over there. Objective SPYRIDON, HORNSTEIN ([id number]) Constitutional Pulse regular. Respirations normal and unlabored. Afebrile. Vitals Time Taken: 1:17 PM, Height: 72 in, Weight: 215 lbs, BMI: 29.2, Temperature: 97.6 F, Pulse: 59 bpm, Respiratory Rate: 16 breaths/min, Blood Pressure: 97/74 mmHg. Eyes Nonicteric. Reactive to light. Ears, Nose, Mouth, and Throat Lips, teeth, and gums WNL.Marland Kitchen Moist mucosa without lesions . Neck  supple and nontender. No palpable supraclavicular or cervical adenopathy. Normal sized without goiter. Respiratory WNL. No retractions.. Cardiovascular Pedal Pulses WNL. significant pedal edema +2. Psychiatric Judgement and insight Intact.. No evidence of depression, anxiety, or agitation.. Integumentary (Hair, Skin) the ulcerated area has some slough on its depth and this will be sharply debrided. The left thigh region looks very good and clean.. Wound #1 status is Open. Original cause of wound was Gradually Appeared. The wound is located on the Right,Medial Lower Leg. The wound measures 2.5cm length x 2.2cm width x 0.8cm depth; 4.32cm^2 area and 3.456cm^3 volume. The wound is limited to skin breakdown. There is no tunneling or undermining noted. There is a large amount of serous drainage noted. The wound margin is distinct with the outline attached to the wound base. There is small (1-33%) red granulation within the wound bed. There  is a large (67-100%) amount of necrotic tissue within the wound bed including Eschar and Adherent Slough. The periwound skin appearance exhibited: Localized Edema, Moist, Erythema. The periwound skin appearance did not exhibit: Callus, Crepitus, Excoriation, Fluctuance, Friable, Induration, Rash, Scarring, Dry/Scaly, Maceration, Atrophie Blanche, Cyanosis, Ecchymosis, Hemosiderin Staining, Mottled, Pallor, Rubor. The surrounding wound skin color is noted with erythema which is circumferential. Periwound temperature was noted as No Abnormality. The periwound has tenderness on palpation. Wound #2 status is Open. Original cause of wound was Gradually Appeared. The wound is located on the Left,Anterior Upper Leg. The wound measures 0.7cm length x 0.7cm width x 0.1cm depth; 0.385cm^2 area and 0.038cm^3 volume. The wound is limited to skin breakdown. There is no tunneling or undermining noted. There is a small amount of serous drainage noted. The wound margin is flat and intact. There is medium (34-66%) red granulation within the wound bed. There is a medium (34-66%) amount of necrotic tissue within the wound bed including Eschar and Adherent Slough. The periwound skin appearance exhibited: Moist, Erythema. The periwound skin appearance did not exhibit: Callus, Crepitus, Excoriation, Durnell, Kobie E. ([id number]) Fluctuance, Friable, Induration, Localized Edema, Rash, Scarring, Dry/Scaly, Maceration, Atrophie Blanche, Cyanosis, Ecchymosis, Hemosiderin Staining, Mottled, Pallor, Rubor. The surrounding wound skin color is noted with erythema which is circumferential. Periwound temperature was noted as No Abnormality. The periwound has tenderness on palpation. 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 He has his vascular test pending this coming week and hopefully once we have those reports will be able to give him more compression. He is urged to elevate his legs as much as possible when he sitting or sleeping and he will come back and see me next week. Procedures Wound #1 Wound #1 is a Venous Leg Ulcer located on the Right,Medial Lower Leg . There was a Skin/Subcutaneous Tissue Debridement (40981-19147) debridement with total area of 5.5 sq cm performed by Tamzin Bertling, Ignacia Felling., MD. with the following instrument(s): Curette to remove Viable and Non-Viable tissue/material including Fibrin/Slough, Eschar, and Subcutaneous after achieving pain control using Lidocaine 5% topical ointment. A time out was conducted prior to the  start of the procedure. A Minimum amount of bleeding was controlled with Pressure. The procedure was tolerated well with a pain level of 2 throughout and a pain level of 0 following the procedure. Post Debridement Measurements: 2.5cm length x 2.2cm width x 0.8cm depth; 3.456cm^3 volume. Nicholas LedgerRKER, Nicholas E. ([id number]) Plan Wound Cleansing: Wound #1 Right,Medial Lower Leg: Clean wound with Normal Saline. Wound #2 Left,Anterior Upper Leg: Clean wound with Normal Saline. Anesthetic: Wound #1 Right,Medial Lower Leg: Topical Lidocaine 4% cream applied to wound bed prior to debridement Wound #2 Left,Anterior Upper Leg: Topical Lidocaine 4% cream applied to wound bed prior to debridement Skin Barriers/Peri-Wound Care: Wound #1 Right,Medial Lower Leg: Skin Prep Wound #2 Left,Anterior Upper Leg: Skin Prep Primary Wound  Dressing: Wound #1 Right,Medial Lower Leg: Prisma Ag - or collagen with silver equivalent Wound #2 Left,Anterior Upper Leg: Prisma Ag - or collagen with silver equivalent Secondary Dressing: Wound #1 Right,Medial Lower Leg: Boardered Foam Dressing Wound #2 Left,Anterior Upper Leg: Boardered Foam Dressing Dressing Change Frequency: Wound #1 Right,Medial Lower Leg: Change dressing every other day. Wound #2 Left,Anterior Upper Leg: Change dressing every other day. Follow-up Appointments: Wound #1 Right,Medial Lower Leg: Return Appointment in 1 week. Wound #2 Left,Anterior Upper Leg: Return Appointment in 1 week. Home Health: Wound #1 Right,Medial Lower Leg: Continue Home Health Visits Home Health Nurse may visit PRN to address patient s wound care needs. FACE TO FACE ENCOUNTER: MEDICARE and MEDICAID PATIENTS: I certify that this patient is under my care and that I had a face-to-face encounter that meets the physician face-to-face encounter requirements with this patient on this date. The encounter with the patient was in whole or in part for the following MEDICAL CONDITION: (primary reason for Home Healthcare) MEDICAL NECESSITY: I certify, that based on my findings, NURSING services are a medically necessary home health service. HOME BOUND STATUS: I certify that my clinical findings support that this patient is homebound (i.e., Due to illness or injury, pt requires aid of supportive devices such as crutches, cane, wheelchairs, walkers, the use of special transportation or the assistance of another person to leave their place of residence. There is a Nicholas LedgerRKER, Nicholas E. ([id number]) normal inability to leave the home and doing so requires considerable and taxing effort. Other absences are for medical reasons / religious services and are infrequent or of short duration when for other reasons). If current dressing causes regression in wound condition, may D/C ordered dressing product/s and  apply Normal Saline Moist Dressing daily until next Wound Healing Center / Other MD appointment. Notify Wound Healing Center of regression in wound condition at [phone]. Please direct any NON-WOUND related issues/requests for orders to patient's Primary Care Physician Wound #2 Left,Anterior Upper Leg: Continue Home Health Visits Home Health Nurse may visit PRN to address patient s wound care needs. FACE TO FACE ENCOUNTER: MEDICARE and MEDICAID PATIENTS: I certify that this patient is under my care and that I had a face-to-face encounter that meets the physician face-to-face encounter requirements with this patient on this date. The encounter with the patient was in whole or in part for the following MEDICAL CONDITION: (primary reason for Home Healthcare) MEDICAL NECESSITY: I certify, that based on my findings, NURSING services are a medically necessary home health service. HOME BOUND STATUS: I certify that my clinical findings support that this patient is homebound (i.e., Due to illness or injury, pt requires aid of supportive devices such as crutches, cane, wheelchairs, walkers, the use of special transportation or the  assistance of another person to leave their place of residence. There is a normal inability to leave the home and doing so requires considerable and taxing effort. Other absences are for medical reasons / religious services and are infrequent or of short duration when for other reasons). If current dressing causes regression in wound condition, may D/C ordered dressing product/s and apply Normal Saline Moist Dressing daily until next Wound Healing Center / Other MD appointment. Notify Wound Healing Center of regression in wound condition at [phone]. Please direct any NON-WOUND related issues/requests for orders to patient's Primary Care Physician his edema has improved markedly but his ulcer on the right lower extremity is deep and at this stage I will begin using  silver collagen. He has his vascular test pending this coming week and hopefully once we have those reports will be able to give him more compression. He is urged to elevate his legs as much as possible when he sitting or sleeping and he will come back and see me next week. Electronic Signature(s) Signed: 08/15/2014 4:59:19 PM By: Evlyn Kanner MD, FACS Entered By: Evlyn Kanner on 08/15/2014 13:56:25

## 2014-08-23 ENCOUNTER — Encounter: Payer: Medicare Other | Admitting: Surgery

## 2014-08-23 DIAGNOSIS — L97122 Non-pressure chronic ulcer of left thigh with fat layer exposed: Secondary | ICD-10-CM | POA: Diagnosis not present

## 2014-08-26 ENCOUNTER — Encounter: Payer: Self-pay | Admitting: Vascular Surgery

## 2014-08-26 NOTE — Progress Notes (Signed)
MACKLEN, WILHOITE ([id number]) Visit Report for 08/23/2014 Arrival Information Details Patient Name: Nicholas Leonard, Nicholas Leonard. Date of Service: 08/23/2014 1:00 PM Medical Record Number: [id number] Patient Account Number: [id number] Date of Birth/Sex: 09-11-1945 (69 y.o. Male) Treating RN: Clover Mealy, RN, BSN, [place] Sink Primary Care Physician: Daniel Nones Other Clinician: Referring Physician: Daniel Nones Treating Physician/Extender: Rudene Re in Treatment: 6 Visit Information History Since Last Visit Any new allergies or adverse reactions: No Patient Arrived: Ambulatory Had a fall or experienced change in No Arrival Time: 13:07 activities of daily living that may affect Accompanied By: self risk of falls: Transfer Assistance: None Signs or symptoms of abuse/neglect since last No Patient Identification Verified: Yes visito Secondary Verification Process Yes Hospitalized since last visit: No Completed: Has Dressing in Place as Prescribed: Yes Patient Has Alerts: Yes Pain Present Now: No Patient Alerts: Patient on Blood Thinner coumadin DMII ABI L 1.75 R 1.55 Electronic Signature(s) Signed: 08/23/2014 2:17:29 PM By: Elpidio Eric BSN, RN Entered By: Elpidio Eric on 08/23/2014 13:12:58 Nicholas Leonard ([id number]) -------------------------------------------------------------------------------- Encounter Discharge Information Details Patient Name: Nicholas Leonard. Date of Service: 08/23/2014 1:00 PM Medical Record Number: [id number] Patient Account Number: [id number] Date of Birth/Sex: 1945/11/04 (69 y.o. Male) Treating RN: Clover Mealy, RN, BSN, [place] Sink Primary Care Physician: Daniel Nones Other Clinician: Referring Physician: Daniel Nones Treating Physician/Extender: Rudene Re in Treatment: 6 Encounter Discharge Information Items Discharge Pain Level: 0 Discharge Condition: Stable Ambulatory Status: Ambulatory Discharge Destination: Home Transportation: Private  Auto Accompanied By: self Schedule Follow-up Appointment: No Medication Reconciliation completed and provided to Patient/Care No Mahum Betten: Provided on Clinical Summary of Care: 08/23/2014 Form Type Recipient Paper Patient FP Electronic Signature(s) Signed: 08/23/2014 2:17:29 PM By: Elpidio Eric BSN, RN Previous Signature: 08/23/2014 1:50:32 PM Version By: Gwenlyn Perking Entered By: Elpidio Eric on 08/23/2014 13:54:58 Constancio, [name] ([id number]) -------------------------------------------------------------------------------- Lower Extremity Assessment Details Patient Name: Nicholas Leonard. Date of Service: 08/23/2014 1:00 PM Medical Record Number: [id number] Patient Account Number: [id number] Date of Birth/Sex: [birth year] (69 y.o. Male) Treating RN: Clover Mealy, RN, BSN, [place] Sink Primary Care Physician: Daniel Nones Other Clinician: Referring Physician: Daniel Nones Treating Physician/Extender: Rudene Re in Treatment: 6 Edema Assessment Assessed: [Left: No] [Right: No] E[Left: dema] [Right: :] Calf Left: Right: Point of Measurement: 39 cm From Medial Instep cm 37.3 cm Ankle Left: Right: Point of Measurement: 12 cm From Medial Instep cm 24.2 cm Vascular Assessment Claudication: Claudication Assessment [Right:None] Pulses: Posterior Tibial Dorsalis Pedis Palpable: [Right:Yes] Extremity colors, hair growth, and conditions: Extremity Color: [Right:Mottled] Hair Growth on Extremity: [Right:No] Temperature of Extremity: [Right:Warm] Capillary Refill: [Right:< 3 seconds] Dependent Rubor: [Right:No] Blanched when Elevated: [Right:No] Lipodermatosclerosis: [Right:No] Toe Nail Assessment Left: Right: Thick: Yes Discolored: No Deformed: No Improper Length and Hygiene: No GENARO, BEKKER ([id number]) Electronic Signature(s) Signed: 08/23/2014 2:17:29 PM By: Elpidio Eric BSN, RN Entered By: Elpidio Eric on 08/23/2014 13:18:11 Nicholas Leonard  ([id number]) -------------------------------------------------------------------------------- Multi Wound Chart Details Patient Name: Nicholas Leonard. Date of Service: 08/23/2014 1:00 PM Medical Record Number: [id number] Patient Account Number: [id number] Date of Birth/Sex: May 11, 1945 (69 y.o. 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] [N/A:N/A] Secondary Etiology: [1:Diabetic Wound/Ulcer of N/A the Lower Extremity] [N/A:N/A] Comorbid History: [1:Congestive Heart Failure, Congestive Heart Failure, Coronary Artery Disease, Coronary Artery Disease, Hypertension, Type II Diabetes, Gout, Neuropathy] [2:Hypertension, Type II Diabetes, Gout, Neuropathy] [N/A:N/A] Date Acquired: [1:06/10/2014] [2:07/29/2014] [N/A:N/A] Weeks of Treatment: [1:6] [2:3] [N/A:N/A] Wound Status: [1:Open] [2:Open] [N/A:N/A] Measurements L x W x D 2.7x2.2x0.8 [2:0.1x0.1x0.1] [N/A:N/A] (cm) Area (cm) : [1:4.665] [2:0.008] [N/A:N/A] Volume (cm) : [1:3.732] [2:0.001] [N/A:N/A] % Reduction in Area: [1:-271.10%] [2:99.00%] [N/A:N/A] % Reduction in Volume: -2861.90% [2:98.70%] [N/A:N/A] Classification: [1:Partial Thickness] [2:Grade 1] [N/A:N/A] HBO Classification: [1:Grade 1] [2:N/A] [N/A:N/A] Exudate Amount: [1:Large] [2:None Present] [N/A:N/A] Exudate Type: [1:Serous] [2:N/A] [N/A:N/A] Exudate Color: [1:amber]  [2:N/A] [N/A:N/A] Wound Margin: [1:Distinct, outline attached Flat and Intact] [N/A:N/A] Granulation Amount: [1:Small (1-33%)] [2:Medium (34-66%)] [N/A:N/A] Granulation Quality: [1:Red] [2:Red] [N/A:N/A] Necrotic Amount: [1:Large (67-100%)] [2:Medium (34-66%)] [N/A:N/A] Necrotic Tissue: Eschar, Adherent Slough Eschar, Adherent Slough N/A Exposed Structures: Fascia: No Fascia: No N/A Fat: No Fat: No Tendon: No Tendon: No Muscle: No Muscle: No Joint: No Joint: No Bone: No Bone: No Limited to Skin Limited to Skin Breakdown Breakdown Epithelialization: None Small (1-33%) N/A Periwound Skin Texture: Edema: Yes Edema: No N/A Excoriation: No Excoriation: No Induration: No Induration: No Callus: No Callus: No Crepitus: No Crepitus: No Fluctuance: No Fluctuance: No Friable: No Friable: No Rash: No Rash: No Scarring: No Scarring: No Periwound Skin Moist: Yes Maceration: No N/A Moisture: Maceration: No Moist: No Dry/Scaly: No Dry/Scaly: No Periwound Skin Color: Erythema: Yes Atrophie Blanche: No N/A Atrophie Blanche: No Cyanosis: No Cyanosis: No Ecchymosis: No Ecchymosis: No Erythema: No Hemosiderin Staining: No Hemosiderin Staining: No Mottled: No Mottled: No Pallor: No Pallor: No Rubor: No Rubor: No Erythema Location: Circumferential N/A N/A Erythema Change: Decreased N/A N/A Temperature: No Abnormality No Abnormality N/A Tenderness on Yes No N/A Palpation: Wound Preparation: Ulcer Cleansing: Ulcer Cleansing: N/A Rinsed/Irrigated with Rinsed/Irrigated with Saline Saline Topical Anesthetic Applied: Other: lidocaine 4% Treatment Notes Electronic Signature(s) Signed: 08/23/2014 2:17:29 PM By: Elpidio Eric BSN, RN Entered By: Elpidio Eric on 08/23/2014 13:25:38 MIKAEL, SKODA ([id number]) [name], [name] ([id number]) -------------------------------------------------------------------------------- Multi-Disciplinary Care Plan Details Patient Name:  Nicholas Leonard, Nicholas Leonard. Date of Service: 08/23/2014 1:00 PM Medical Record Number: [id number] Patient Account Number: [id number] Date of Birth/Sex: Jul 24, 1945 (69 y.o. Male) Treating RN: Clover Mealy, RN, BSN, [place] Sink Primary Care Physician: Daniel Nones Other Clinician: Referring Physician: Daniel Nones Treating Physician/Extender: Rudene Re in Treatment: 6 Active Inactive Nutrition Nursing Diagnoses: Impaired glucose control: actual or potential Potential for alteratiion in Nutrition/Potential for imbalanced nutrition Goals: Patient/caregiver verbalizes understanding of need to maintain therapeutic glucose control per primary care physician Date Initiated: 07/09/2014 Goal Status: Active Patient/caregiver will maintain therapeutic glucose control Date Initiated: 07/09/2014 Goal Status: Active Interventions: Assess patient nutrition upon admission and as needed per policy Provide education on elevated blood sugars and impact on wound healing Provide education on nutrition Treatment Activities: Education provided on Nutrition : 07/09/2014 Notes: Orientation to the Wound Care Program Nursing Diagnoses: Knowledge deficit related to the wound healing center program Goals: Patient/caregiver will verbalize understanding of the Wound Healing Center Program Date Initiated: 07/09/2014 Goal Status: Active Interventions: Provide education on orientation to the wound center SARP, VERNIER. ([id number]) Notes: Venous Leg Ulcer Nursing Diagnoses: Knowledge deficit related to disease process and management Potential for venous Insuffiency (use before diagnosis confirmed) Goals: Patient will maintain optimal edema control Date Initiated: 07/09/2014 Goal Status: Active  Verify adequate tissue perfusion prior to therapeutic compression application Date Initiated: 07/09/2014 Goal Status: Active Interventions: Assess peripheral edema status every visit. Compression as ordered Provide education  on venous insufficiency Treatment Activities: Test ordered outside of clinic : 08/23/2014 Therapeutic compression applied : 08/23/2014 Notes: Wound/Skin Impairment Nursing Diagnoses: Knowledge deficit related to ulceration/compromised skin integrity Goals: Patient/caregiver will verbalize understanding of skin care regimen Date Initiated: 07/09/2014 Goal Status: Active Ulcer/skin breakdown will heal within 14 weeks Date Initiated: 07/09/2014 Goal Status: Active Interventions: Assess patient/caregiver ability to obtain necessary supplies Assess patient/caregiver ability to perform ulcer/skin care regimen upon admission and as needed Assess ulceration(s) every visit Provide education on ulcer and skin care Nicholas LedgerRKER, Smayan E. ([id number]) Treatment Activities: Patient referred to home care : 08/23/2014 Skin care regimen initiated : 08/23/2014 Topical wound management initiated : 08/23/2014 Notes: Electronic Signature(s) Signed: 08/23/2014 2:17:29 PM By: Elpidio EricAfful, Rita BSN, RN Entered By: Elpidio EricAfful, Rita on 08/23/2014 13:25:22 Nicholas LedgerPARKER, Savior E. ([id number]) -------------------------------------------------------------------------------- Pain Assessment Details Patient Name: Nicholas LedgerPARKER, Dolph E. Date of Service: 08/23/2014 1:00 PM Medical Record Number: [id number] Patient Account Number: [id number] Date of Birth/Sex: 1945-09-06 48(68 y.o. Male) Treating RN: Clover MealyAfful, RN, BSN, [place] Primary Care Physician: Daniel NonesKLEIN, BERT Other Clinician: Referring Physician: Daniel NonesKLEIN, BERT Treating Physician/Extender: Rudene ReBritto, Errol Weeks in Treatment: 6 Active Problems Location of Pain Severity and Description of Pain Patient Has Paino No Site Locations Pain Management and Medication Current Pain Management: Electronic Signature(s) Signed: 08/23/2014 2:17:29 PM By: Elpidio EricAfful, Rita BSN, RN Entered By: Elpidio EricAfful, Rita on 08/23/2014 13:13:05 Nicholas LedgerPARKER, Terrelle E.  ([id number]) -------------------------------------------------------------------------------- Patient/Caregiver Education Details Patient Name: Nicholas LedgerRKER, Tykel E. Date of Service: 08/23/2014 1:00 PM Medical Record Number: [id number] Patient Account Number: [id number] Date of Birth/Gender: 1945-09-06 [age] y.o. Male) Treating RN: Clover MealyAfful, RN, BSN, [place] Sink[place] Primary Care Physician: Daniel NonesKLEIN, BERT Other Clinician: Referring Physician: Daniel NonesKLEIN, BERT Treating Physician/Extender: Rudene ReBritto, Errol Weeks in Treatment: 6 Education Assessment Education Provided To: Patient Education Topics Provided Wound/Skin Impairment: Methods: Explain/Verbal Responses: State content correctly Electronic Signature(s) Signed: 08/23/2014 2:17:29 PM By: Elpidio EricAfful, Rita BSN, RN Entered By: Elpidio EricAfful, Rita on 08/23/2014 13:55:08 Nicholas LedgerPARKER, Brent E. ([id number]) -------------------------------------------------------------------------------- Wound Assessment Details Patient Name: Nicholas LedgerRKER, Xavious E. Date of Service: 08/23/2014 1:00 PM Medical Record Number: [id number] Patient Account Number: [id number] Date of Birth/Sex: 1945-09-06 21(68 y.o. Male) Treating RN: Clover MealyAfful, RN, BSN, Rita Primary Care Physician: Daniel NonesKLEIN, BERT Other Clinician: Referring Physician: Daniel NonesKLEIN, BERT Treating Physician/Extender: Rudene ReBritto, Errol Weeks in Treatment: 6 Wound Status Wound Number: 1 Primary Venous Leg Ulcer Etiology: Wound Location: Right Lower Leg - Medial Secondary Diabetic Wound/Ulcer of the Lower Wounding Event: Gradually Appeared Etiology: Extremity Date Acquired: 06/10/2014 Wound Open Weeks Of Treatment: 6 Status: Clustered Wound: No Comorbid Congestive Heart Failure, Coronary History: Artery Disease, Hypertension, Type II Diabetes, Gout, Neuropathy Photos Photo Uploaded By: Elpidio EricAfful, Rita on 08/23/2014 14:16:51 Wound Measurements Length: (cm) 2.7 Width: (cm) 2.2 Depth: (cm) 0.8 Area: (cm) 4.665 Volume: (cm) 3.732 % Reduction in Area:  -271.1% % Reduction in Volume: -2861.9% Epithelialization: None Tunneling: No Undermining: No Wound Description Classification: Partial Thickness Diabetic Severity (Wagner): Grade 1 Wound Margin: Distinct, outline attach Exudate Amount: Large Exudate Type: Serous Exudate Color: amber Foul Odor After Cleansing: No ed Wound Bed Granulation Amount: Small (1-33%) Exposed Structure Genest, Kelton E. ([id number]) Granulation Quality: Red Fascia Exposed: No Necrotic Amount: Large (67-100%) Fat Layer Exposed: No Necrotic Quality: Eschar, Adherent Slough Tendon Exposed: No Muscle Exposed: No Joint Exposed: No Bone Exposed: No Limited to Skin Breakdown Periwound Skin Texture Texture Color No Abnormalities Noted: No  No Abnormalities Noted: No Callus: No Atrophie Blanche: No Crepitus: No Cyanosis: No Excoriation: No Ecchymosis: No Fluctuance: No Erythema: Yes Friable: No Erythema Location: Circumferential Induration: No Erythema Change: Decreased Localized Edema: Yes Hemosiderin Staining: No Rash: No Mottled: No Scarring: No Pallor: No Rubor: No Moisture No Abnormalities Noted: No Temperature / Pain Dry / Scaly: No Temperature: No Abnormality Maceration: No Tenderness on Palpation: Yes Moist: Yes Wound Preparation Ulcer Cleansing: Rinsed/Irrigated with Saline Topical Anesthetic Applied: Other: lidocaine 4%, Treatment Notes Wound #1 (Right, Medial Lower Leg) 1. Cleansed with: Cleanse wound with antibacterial soap and water 3. Peri-wound Care: Barrier cream 4. Dressing Applied: Acticoat 7 5. Secondary Dressing Applied ABD and Kerlix/Conform 7. Secured with 2 Layer Lite Compression System - Right Lower Extremity Electronic Signature(s) Signed: 08/23/2014 2:17:29 PM By: Elpidio EricAfful, Rita BSN, RN Entered By: Elpidio EricAfful, Rita on 08/23/2014 13:19:22 Nicholas LedgerPARKER, [name] E. ([id number]) Jimmey RalphPARKER, Beatrix FettersFREDDIE E.  ([id number]) -------------------------------------------------------------------------------- Wound Assessment Details Patient Name: Nicholas LedgerRKER, Deshun E. Date of Service: 08/23/2014 1:00 PM Medical Record Number: [id number] Patient Account Number: [id number] Date of Birth/Sex: 08-19-1945 [age] y.o. Male) Treating RN: Clover MealyAfful, RN, BSN, Rita Primary Care Physician: Daniel NonesKLEIN, BERT Other Clinician: Referring Physician: Daniel NonesKLEIN, BERT Treating Physician/Extender: Rudene ReBritto, Errol Weeks in Treatment: 6 Wound Status Wound Number: 2 Primary Diabetic Wound/Ulcer of the Lower Etiology: Extremity Wound Location: Left, Anterior Upper Leg Wound Healed - Epithelialized Wounding Event: Gradually Appeared Status: Date Acquired: 07/29/2014 Comorbid Congestive Heart Failure, Coronary Weeks Of Treatment: 3 History: Artery Disease, Hypertension, Type II Clustered Wound: No Diabetes, Gout, Neuropathy Photos Photo Uploaded By: Elpidio EricAfful, Rita on 08/23/2014 14:16:51 Wound Measurements Length: (cm) 0 % Reduction i Width: (cm) 0 % Reduction i Depth: (cm) 0 Epithelializa Area: (cm) 0 Tunneling: Volume: (cm) 0 Undermining: n Area: 100% n Volume: 100% tion: Small (1-33%) No No Wound Description Classification: Grade 1 Foul Odor Aft Wound Margin: Flat and Intact Exudate Amount: None Present er Cleansing: No Wound Bed Granulation Amount: Medium (34-66%) Exposed Structure Granulation Quality: Red Fascia Exposed: No Necrotic Amount: Medium (34-66%) Fat Layer Exposed: No Necrotic Quality: Eschar, Adherent Slough Tendon Exposed: No Muscle Exposed: No Joint Exposed: No Jeanlouis, Masyn E. ([id number]) Bone Exposed: No Limited to Skin Breakdown Periwound Skin Texture Texture Color No Abnormalities Noted: No No Abnormalities Noted: No Callus: No Atrophie Blanche: No Crepitus: No Cyanosis: No Excoriation: No Ecchymosis: No Fluctuance: No Erythema: No Friable: No Hemosiderin Staining: No Induration:  No Mottled: No Localized Edema: No Pallor: No Rash: No Rubor: No Scarring: No Temperature / Pain Moisture Temperature: No Abnormality No Abnormalities Noted: No Dry / Scaly: No Maceration: No Moist: No Wound Preparation Ulcer Cleansing: Rinsed/Irrigated with Saline Electronic Signature(s) Signed: 08/23/2014 2:17:29 PM By: Elpidio EricAfful, Rita BSN, RN Entered By: Elpidio EricAfful, Rita on 08/23/2014 13:31:33 Nicholas LedgerPARKER, Knox E. ([id number]) -------------------------------------------------------------------------------- Vitals Details Patient Name: Nicholas LedgerPARKER, Quince E. Date of Service: 08/23/2014 1:00 PM Medical Record Number: [id number] Patient Account Number: [id number] Date of Birth/Sex: 08-19-1945 [age] y.o. Male) Treating RN: Clover MealyAfful, RN, BSN, Rita Primary Care Physician: Daniel NonesKLEIN, BERT Other Clinician: Referring Physician: Daniel NonesKLEIN, BERT Treating Physician/Extender: Rudene ReBritto, Errol Weeks in Treatment: 6 Vital Signs Time Taken: 13:10 Temperature (F): 97.6 Height (in): 72 Pulse (bpm): 68 Weight (lbs): 215 Respiratory Rate (breaths/min): 16 Body Mass Index (BMI): 29.2 Blood Pressure (mmHg): 92/57 Reference Range: 80 - 120 mg / dl Electronic Signature(s) Signed: 08/23/2014 2:17:29 PM By: Elpidio EricAfful, Rita BSN, RN Entered By: Elpidio EricAfful, Rita on 08/23/2014 13:14:06

## 2014-08-26 NOTE — Progress Notes (Signed)
ROYE, GUSTAFSON ([id number]) Visit Report for 08/23/2014 Chief Complaint Document Details Patient Name: Nicholas Leonard, Nicholas Leonard. Date of Service: 08/23/2014 1:00 PM Medical Record Number: [id number] Patient Account Number: [id number] Date of Birth/Sex: [birth year] (69 y.o. Male) Treating RN: Primary Care Physician: Daniel Nones Other Clinician: Referring Physician: Daniel Nones Treating Physician/Extender: Rudene Re in Treatment: 6 Information Obtained from: Patient Chief Complaint Patient presents to the wound care center for a consult due non healing wound ulcer on the right lower limb which was noted to be there for about 3 weeks now. he is also had swelling of both lower extremities for about 2 months now. Electronic Signature(s) Signed: 08/26/2014 12:30:09 PM By: Evlyn Kanner MD, FACS Entered By: Evlyn Kanner on 08/23/2014 13:34:49 Nicholas Leonard ([id number]) -------------------------------------------------------------------------------- Debridement Details Patient Name: Nicholas Leonard, Nicholas Leonard. Date of Service: 08/23/2014 1:00 PM Medical Record Number: [id number] Patient Account Number: [id number] Date of Birth/Sex: 01-23-1946 (69 y.o. Male) Treating RN: Primary Care Physician: Daniel Nones Other Clinician: Referring Physician: Daniel Nones Treating Physician/Extender: Rudene Re in Treatment: 6 Debridement Performed for Wound #1 Right,Medial Lower Leg Assessment: Performed By: Physician Tristan Schroeder., MD Debridement: Open Wound/Selective Debridement Selective Description: Pre-procedure Yes Verification/Time Out Taken: Start Time: 13:28 Pain Control: Lidocaine 4% Topical Solution Level: Non-Viable Tissue Total Area Debrided (L x 2.7 (cm) x 2.2 (cm) = 5.94 (cm) W): Tissue and other Non-Viable, Exudate, Fibrin/Slough, Subcutaneous material debrided: Instrument: Other : gauze, saline Bleeding: Minimum Hemostasis Achieved: Pressure End Time:  13:30 Procedural Pain: 0 Post Procedural Pain: 0 Response to Treatment: Procedure was tolerated well Post Debridement Measurements of Total Wound Length: (cm) 2.7 Width: (cm) 2.2 Depth: (cm) 0.9 Volume: (cm) 4.199 Electronic Signature(s) Signed: 08/26/2014 12:30:09 PM By: Evlyn Kanner MD, FACS Entered By: Evlyn Kanner on 08/23/2014 13:34:41 Nicholas Leonard ([id number]) -------------------------------------------------------------------------------- HPI Details Patient Name: Nicholas Leonard. Date of Service: 08/23/2014 1:00 PM Medical Record Number: [id number] Patient Account Number: [id number] Date of Birth/Sex: 1945-06-25 (69 y.o. Male) Treating RN: Primary Care Physician: Daniel Nones Other Clinician: Referring Physician: Daniel Nones Treating Physician/Extender: Rudene Re in Treatment: 6 History of Present Illness HPI Description: 69 year old gentleman who is recently discharged from [facility] and was admitted between 3 18-3/22 by his primary care physician Dr. Daniel Nones. He has adult onset diabetes and was recently admitted for acute on chronic left-sided systolic congestive heart failure, acute on chronic renal failure, coronary artery disease, ischemic cardiomyopathy, ventricular fibrillation status post AICD implantation, history of atrial fibrillation on Coumadin therapy, hypothyroidism, history of venous stasis ulcer, pulmonary embolism with deep vein thrombosis on chronic Coumadin therapy, osteoarthritis and gout. He does not smoke any longer and occasionally has alcohol. as not been compliant with getting his blood sugars checked and does not know the last time he had a hemoglobin A1c done. Does not recall when the last ultrasound of his lower extremities was done but he does say that he has had clots in his legs. 07/30/2014 the patient is back to [place] after a recent admission and discharge from the hospital and he was there between  07/19/2014 and 07/26/2014. Reviewed these notes and his final diagnosis was #1 elevated INR, #2 acute on chronic left-sided systolic congestive heart failure, #3 hypothyroidism, #4 gout, #5 chronic kidney disease stage III, #6 acute renal failure, #7 carotid artery disease, #8 history of pulmonary embolism and DVT, #9 adult onset diabetes mellitus. During the admission he was corrected with giving vitamin K and due to his difficulty  with Coumadin he was changed over to Eliquis. The patient also was found to have diffuse edema and there was evidence of ascites on abdominal ultrasound and was placed on a Lasix drip by nephrologist. His just discharge medications were noted by me. they have been using Santyl on his right lower extremity wound and he has been doing this dressing as often as the nurse comes in. he has also developed a ulcer of the left thigh and this has been there for about 2 weeks and has some necrosis of skin over there. 08/23/2014 -- he recently saw the vascular surgeon Dr. Gilda Crease and has had both an arterial and duplex venous study done. Though we do not have the official report yet the patient tells me that his arteries were okay and there was no problem with the blood flow. 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 Electronic Signature(s) Signed: 08/26/2014 12:30:09 PM By: Evlyn Kanner MD, FACS Entered By: Evlyn Kanner on 08/23/2014 13:36:39 Nicholas Leonard ([id number]) -------------------------------------------------------------------------------- Physical Exam Details Patient Name: Nicholas Leonard, Nicholas Leonard. Date of Service: 08/23/2014 1:00 PM Medical Record Number: [id number] Patient Account Number: [id number] Date of Birth/Sex: [birth year] (69 y.o. Male) Treating RN: Primary Care Physician: Daniel Nones Other Clinician: Referring Physician: Daniel Nones Treating Physician/Extender:  Rudene Re in Treatment: 6 Constitutional . Pulse regular. Respirations normal and unlabored. Afebrile. . Eyes Nonicteric. Reactive to light. Ears, Nose, Mouth, and Throat Lips, teeth, and gums WNL.Marland Kitchen Moist mucosa without lesions . Neck supple and nontender. No palpable supraclavicular or cervical adenopathy. Normal sized without goiter. Respiratory WNL. No retractions.. Cardiovascular Pedal Pulses WNL. No clubbing, cyanosis and his edema is much less than before. +1 pitting edema. Integumentary (Hair, Skin) the ulceration has a clean base after debriding the loose debris and slough.. No crepitus or fluctuance. No peri-wound warmth or erythema. No masses.Marland Kitchen Psychiatric Judgement and insight Intact.. No evidence of depression, anxiety, or agitation.. Electronic Signature(s) Signed: 08/26/2014 12:30:09 PM By: Evlyn Kanner MD, FACS Entered By: Evlyn Kanner on 08/23/2014 13:37:32 Nicholas Leonard ([id number]) -------------------------------------------------------------------------------- Physician Orders Details Patient Name: Nicholas Leonard, Nicholas Leonard. Date of Service: 08/23/2014 1:00 PM Medical Record Number: [id number] Patient Account Number: [id number] Date of Birth/Sex: January 29, 1946 (69 y.o. Male) Treating RN: Clover Mealy, RN, BSN, [place] Primary Care Physician: Daniel Nones Other Clinician: Referring Physician: Daniel Nones Treating Physician/Extender: Rudene Re in Treatment: 6 Verbal / Phone Orders: Yes Clinician: Afful, RN, BSN, Rita Read Back and Verified: Yes Diagnosis Coding Wound Cleansing Wound #1 Right,Medial Lower Leg o May shower with protection. o No tub bath. Anesthetic Wound #1 Right,Medial Lower Leg o Topical Lidocaine 4% cream applied to wound bed prior to debridement Skin Barriers/Peri-Wound Care Wound #1 Right,Medial Lower Leg o Barrier cream Primary Wound Dressing Wound #1 Right,Medial Lower Leg o Acticoat 7 Secondary Dressing Wound #1  Right,Medial Lower Leg o ABD and Kerlix/Conform Dressing Change Frequency Wound #1 Right,Medial Lower Leg o Change dressing every week Follow-up Appointments Wound #1 Right,Medial Lower Leg o Return Appointment in 1 week. Edema Control Wound #1 Right,Medial Lower Leg o 2 Layer Lite Compression System - Right Lower Extremity Home Health Nicholas Leonard, Nicholas Leonard ([id number]) Wound #1 Right,Medial Lower Leg o Continue Home Health Visits o Home Health Nurse may visit PRN to address patientos wound care needs. o FACE TO FACE ENCOUNTER: MEDICARE and MEDICAID PATIENTS: I certify that this patient is under my care and that I had  a face-to-face encounter that meets the physician face-to-face encounter requirements with this patient on this date. The encounter with the patient was in whole or in part for the following MEDICAL CONDITION: (primary reason for Home Healthcare) MEDICAL NECESSITY: I certify, that based on my findings, NURSING services are a medically necessary home health service. HOME BOUND STATUS: I certify that my clinical findings support that this patient is homebound (i.e., Due to illness or injury, pt requires aid of supportive devices such as crutches, cane, wheelchairs, walkers, the use of special transportation or the assistance of another person to leave their place of residence. There is a normal inability to leave the home and doing so requires considerable and taxing effort. Other absences are for medical reasons / religious services and are infrequent or of short duration when for other reasons). o If current dressing causes regression in wound condition, may D/C ordered dressing product/s and apply Normal Saline Moist Dressing daily until next Wound Healing Center / Other MD appointment. Notify Wound Healing Center of regression in wound condition at [phone]. o Please direct any NON-WOUND related issues/requests for orders to patient's Primary  Care Physician Electronic Signature(s) Signed: 08/23/2014 2:17:29 PM By: Elpidio EricAfful, Rita BSN, RN Signed: 08/26/2014 12:30:09 PM By: Evlyn KannerBritto, Haylyn Halberg MD, FACS Entered By: Elpidio EricAfful, Rita on 08/23/2014 13:33:26 Nicholas LedgerPARKER, Nicholas E. ([id number]) -------------------------------------------------------------------------------- Problem List Details Patient Name: Nicholas Leonard, Nicholas E. Date of Service: 08/23/2014 1:00 PM Medical Record Number: [id number] Patient Account Number: [id number] Date of Birth/Sex: 06/22/1945 34(68 y.o. Male) Treating RN: Primary Care Physician: Daniel NonesKLEIN, BERT Other Clinician: Referring Physician: Daniel NonesKLEIN, BERT Treating Physician/Extender: Rudene ReBritto, Xyler Terpening Weeks in Treatment: 6 Active Problems ICD-10 Encounter Code Description Active Date Diagnosis E11.622 Type 2 diabetes mellitus with other skin ulcer 07/09/2014 Yes I50.21 Acute systolic (congestive) heart failure 07/09/2014 Yes I80.291 Phlebitis and thrombophlebitis of other deep vessels of 07/09/2014 Yes right lower extremity I87.311 Chronic venous hypertension (idiopathic) with ulcer of 07/09/2014 Yes right lower extremity 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 ([id number]) -------------------------------------------------------------------------------- Progress Note Details Patient Name: Nicholas LedgerPARKER, Nicholas E. Date of Service: 08/23/2014 1:00 PM Medical Record Number: [id number] Patient Account Number: [id number] Date of Birth/Sex: 06/22/1945 [age] y.o. Male) Treating RN: Primary Care Physician: Daniel NonesKLEIN, BERT Other Clinician: Referring Physician: Daniel NonesKLEIN, BERT Treating Physician/Extender: Rudene ReBritto, Gearl Baratta Weeks in Treatment:  6 Subjective Chief Complaint Information obtained from Patient Patient presents to the wound care center for a consult due non healing wound ulcer on the right lower limb which was noted to be there for about 3 weeks now. he is also had swelling of both lower extremities for about 2 months now. History of Present Illness (HPI) 69 year old gentleman who is recently [facility] and was admitted between 3 18-3/22 by his primary care physician Dr. Daniel NonesBert Klein. He has adult onset diabetes and was recently admitted for acute on chronic left-sided systolic congestive heart failure, acute on chronic renal failure, coronary artery disease, ischemic cardiomyopathy, ventricular fibrillation status post AICD implantation, history of atrial fibrillation on Coumadin therapy, hypothyroidism, history of venous stasis ulcer, pulmonary embolism with deep vein thrombosis on chronic Coumadin therapy, osteoarthritis and gout. He does not smoke any longer and occasionally has alcohol. as not been compliant with getting his blood sugars checked and does  not know the last time he had a hemoglobin A1c done. Does not recall when the last ultrasound of his lower extremities was done but he does say that he has had clots in his legs. 07/30/2014 the patient is back to [place] after a recent admission and discharge from the hospital and he was there between 07/19/2014 and 07/26/2014. Reviewed these notes and his final diagnosis was #1 elevated INR, #2 acute on chronic left-sided systolic congestive heart failure, #3 hypothyroidism, #4 gout, #5 chronic kidney disease stage III, #6 acute renal failure, #7 carotid artery disease, #8 history of pulmonary embolism and DVT, #9 adult onset diabetes mellitus. During the admission he was corrected with giving vitamin K and due to his difficulty with Coumadin he was changed over to Eliquis. The patient also was found to have diffuse edema and there was  evidence of ascites on abdominal ultrasound and was placed on a Lasix drip by nephrologist. His just discharge medications were noted by me. they have been using Santyl on his right lower extremity wound and he has been doing this dressing as often as the nurse comes in. he has also developed a ulcer of the left thigh and this has been there for about 2 weeks and has some necrosis of skin over there. 08/23/2014 -- he recently saw the vascular surgeon Dr. Gilda Crease and has had both an arterial and duplex venous study done. Though we do not have the official report yet the patient tells me that his arteries were okay and there was no problem with the blood flow. 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 Nicholas Leonard, Nicholas Leonard ([id number]) Objective Constitutional Pulse regular. Respirations normal and unlabored. Afebrile. Vitals Time Taken: 1:10 PM, Height: 72 in, Weight: 215 lbs, BMI: 29.2, Temperature: 97.6 F, Pulse: 68 bpm, Respiratory Rate: 16 breaths/min, Blood Pressure: 92/57 mmHg. Eyes Nonicteric. Reactive to light. Ears, Nose, Mouth, and Throat Lips, teeth, and gums WNL.Marland Kitchen Moist mucosa without lesions . Neck supple and nontender. No palpable supraclavicular or cervical adenopathy. Normal sized without goiter. Respiratory WNL. No retractions.. Cardiovascular Pedal Pulses WNL. No clubbing, cyanosis and his edema is much less than before. +1 pitting edema. Psychiatric Judgement and insight Intact.. No evidence of depression, anxiety, or agitation.. Integumentary (Hair, Skin) the ulceration has a clean base after debriding the loose debris and slough.. No crepitus or fluctuance. No peri-wound warmth or erythema. No masses.. Wound #1 status is Open. Original cause of wound was Gradually Appeared. The wound is located on the Right,Medial Lower Leg. The wound measures 2.7cm length x 2.2cm width x 0.8cm  depth; 4.665cm^2 area and 3.732cm^3 volume. The wound is limited to skin breakdown. There is no tunneling or undermining noted. There is a large amount of serous drainage noted. The wound margin is distinct with the outline attached to the wound base. There is small (1-33%) red granulation within the wound bed. There is a large (67-100%) amount of necrotic tissue within the wound bed including Eschar and Adherent Slough. The periwound skin appearance exhibited: Localized Edema, Moist, Erythema. The periwound skin appearance did not exhibit: Callus, Crepitus, Excoriation, Fluctuance, Friable, Induration, Rash, Scarring, Dry/Scaly, Maceration, Atrophie Blanche, Cyanosis, Ecchymosis, Hemosiderin Staining, Mottled, Pallor, Rubor. The surrounding wound skin color is noted with erythema which is circumferential. Periwound temperature was noted as No Abnormality. The periwound has tenderness on palpation. Wound #2 status is Healed - Epithelialized. Original cause of wound was  Gradually Appeared. The wound is located on the Left,Anterior Upper Leg. The wound measures 0cm length x 0cm width x 0cm depth; Nicholas Leonard, Nicholas E. ([id number]) 0cm^2 area and 0cm^3 volume. The wound is limited to skin breakdown. There is no tunneling or undermining noted. There is a none present amount of drainage noted. The wound margin is flat and intact. There is medium (34-66%) red granulation within the wound bed. There is a medium (34-66%) amount of necrotic tissue within the wound bed including Eschar and Adherent Slough. The periwound skin appearance did not exhibit: Callus, Crepitus, Excoriation, Fluctuance, Friable, Induration, Localized Edema, Rash, Scarring, Dry/Scaly, Maceration, Moist, Atrophie Blanche, Cyanosis, Ecchymosis, Hemosiderin Staining, Mottled, Pallor, Rubor, Erythema. Periwound temperature was noted as No Abnormality. 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 We will use Acticoat 7 on his ulcer and use a Coban 2 layer light dressing for compression. We will see him back next week. Procedures Wound #1 Wound #1 is a Venous Leg Ulcer located on the Right,Medial Lower Leg . There was a Non-Viable Tissue Open Wound/Selective [phone] debridement with total area of 5.94 sq cm performed by Tristan SchroederBritto, Treyson Axel J., MD. with the following instrument(s): gauze, saline to remove Non-Viable tissue/material including Exudate, Fibrin/Slough, and Subcutaneous after achieving pain control using Lidocaine 4% Topical Solution. A time out was conducted prior to the start of the procedure. A Minimum amount of bleeding was controlled with Pressure. The procedure was tolerated well with a pain level of 0 throughout and a pain level of 0 following the procedure. Post Debridement Measurements: 2.7cm length x 2.2cm width x 0.9cm depth; 4.199cm^3 volume. Nicholas Leonard, Nicholas Leonard E. ([id number]) Plan Wound Cleansing: Wound #1 Right,Medial Lower Leg: May shower with protection. No tub bath. Anesthetic: Wound #1 Right,Medial Lower Leg: Topical Lidocaine 4% cream applied to wound bed prior to debridement Skin Barriers/Peri-Wound Care: Wound #1 Right,Medial Lower Leg: Barrier cream Primary Wound Dressing: Wound #1 Right,Medial Lower Leg: Acticoat 7 Secondary Dressing: Wound #1 Right,Medial Lower Leg: ABD and  Kerlix/Conform Dressing Change Frequency: Wound #1 Right,Medial Lower Leg: Change dressing every week Follow-up Appointments: Wound #1 Right,Medial Lower Leg: Return Appointment in 1 week. Edema Control: Wound #1 Right,Medial Lower Leg: 2 Layer Lite Compression System - Right Lower Extremity Home Health: Wound #1 Right,Medial Lower Leg: Continue Home Health Visits Home Health Nurse may visit PRN to address patient s wound care needs. FACE TO FACE ENCOUNTER: MEDICARE and MEDICAID PATIENTS: I certify that this patient is under my care and that I had a face-to-face encounter that meets the physician face-to-face encounter requirements with this patient on this date. The encounter with the patient was in whole or in part for the following MEDICAL CONDITION: (primary reason for Home Healthcare) MEDICAL NECESSITY: I certify, that based on my findings, NURSING services are a medically necessary home health service. HOME BOUND STATUS: I certify that my clinical findings support that this patient is homebound (i.e., Due to illness or injury, pt requires  aid of supportive devices such as crutches, cane, wheelchairs, walkers, the use of special transportation or the assistance of another person to leave their place of residence. There is a normal inability to leave the home and doing so requires considerable and taxing effort. Other absences are for medical reasons / religious services and are infrequent or of short duration when for other reasons). If current dressing causes regression in wound condition, may D/C ordered dressing product/s and apply Normal Saline Moist Dressing daily until next Wound Healing Center / Other MD appointment. Notify Wound Healing Center of regression in wound condition at [phone]. Please direct any NON-WOUND related issues/requests for orders to patient's Primary Care Physician MALAHKI, GASAWAY ([id number]) Though the official report is not back I understand  that his arterial study was within normal limits and his venous study did show that he had venous incompetence on the right side. We will use Acticoat 7 on his ulcer and use a Coban 2 layer light dressing for compression. We will see him back next week. Electronic Signature(s) Signed: 08/26/2014 12:30:09 PM By: Evlyn Kanner MD, FACS Entered By: Evlyn Kanner on 08/23/2014 13:38:39 Nicholas Leonard ([id number]) -------------------------------------------------------------------------------- SuperBill Details Patient Name: KINGSLEY, FARACE. Date of Service: 08/23/2014 Medical Record Number: [id number] Patient Account Number: [id number] Date of Birth/Sex: 01-23-1946 (69 y.o. 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 L97.122 Non-pressure chronic ulcer of left thigh with fat layer exposed Facility Procedures CPT4 Code Description: [id number] - DEBRIDE WOUND 1ST 20 SQ CM OR < ICD-10 Description Diagnosis E11.622 Type 2 diabetes mellitus with other skin ulcer I87.311 Chronic venous hypertension (idiopathic) with ulcer Modifier: of right lowe Quantity: 1 r extremity Physician Procedures CPT4 Code Description: [id number] - WC PHYS DEBR WO ANESTH 20 SQ CM ICD-10 Description Diagnosis E11.622 Type 2 diabetes mellitus with other skin ulcer I87.311 Chronic venous hypertension (idiopathic) with ulcer Modifier: of right lower Quantity: 1 extremity Electronic Signature(s) Signed:  08/26/2014 12:30:09 PM By: Evlyn Kanner MD, FACS Entered By: Evlyn Kanner on 08/23/2014 13:39:01

## 2014-08-28 ENCOUNTER — Encounter: Payer: Self-pay | Admitting: Vascular Surgery

## 2014-08-28 ENCOUNTER — Inpatient Hospital Stay (HOSPITAL_COMMUNITY): Admission: RE | Admit: 2014-08-28 | Payer: Self-pay | Source: Ambulatory Visit

## 2014-08-30 ENCOUNTER — Encounter: Payer: Medicare Other | Admitting: Surgery

## 2014-08-30 DIAGNOSIS — L97122 Non-pressure chronic ulcer of left thigh with fat layer exposed: Secondary | ICD-10-CM | POA: Diagnosis not present

## 2014-09-03 ENCOUNTER — Ambulatory Visit
Admission: RE | Admit: 2014-09-03 | Discharge: 2014-09-03 | Disposition: A | Discharge status: Home / Self Care | Payer: Medicare Other | Source: Ambulatory Visit | Attending: Vascular Surgery | Admitting: Vascular Surgery

## 2014-09-03 ENCOUNTER — Encounter
Admission: RE | Disposition: A | Discharge status: Home / Self Care | Payer: Medicare Other | Source: Ambulatory Visit | Attending: Vascular Surgery

## 2014-09-03 ENCOUNTER — Encounter: Payer: Self-pay | Admitting: *Deleted

## 2014-09-03 DIAGNOSIS — I70202 Unspecified atherosclerosis of native arteries of extremities, left leg: Secondary | ICD-10-CM | POA: Insufficient documentation

## 2014-09-03 DIAGNOSIS — Z86718 Personal history of other venous thrombosis and embolism: Secondary | ICD-10-CM | POA: Insufficient documentation

## 2014-09-03 DIAGNOSIS — I509 Heart failure, unspecified: Secondary | ICD-10-CM | POA: Insufficient documentation

## 2014-09-03 DIAGNOSIS — Z79899 Other long term (current) drug therapy: Secondary | ICD-10-CM | POA: Insufficient documentation

## 2014-09-03 DIAGNOSIS — I8390 Asymptomatic varicose veins of unspecified lower extremity: Secondary | ICD-10-CM | POA: Diagnosis not present

## 2014-09-03 DIAGNOSIS — I10 Essential (primary) hypertension: Secondary | ICD-10-CM | POA: Insufficient documentation

## 2014-09-03 DIAGNOSIS — I252 Old myocardial infarction: Secondary | ICD-10-CM | POA: Diagnosis not present

## 2014-09-03 DIAGNOSIS — E780 Pure hypercholesterolemia: Secondary | ICD-10-CM | POA: Insufficient documentation

## 2014-09-03 DIAGNOSIS — E119 Type 2 diabetes mellitus without complications: Secondary | ICD-10-CM | POA: Insufficient documentation

## 2014-09-03 DIAGNOSIS — L97319 Non-pressure chronic ulcer of right ankle with unspecified severity: Secondary | ICD-10-CM | POA: Insufficient documentation

## 2014-09-03 DIAGNOSIS — I70233 Atherosclerosis of native arteries of right leg with ulceration of ankle: Secondary | ICD-10-CM | POA: Diagnosis present

## 2014-09-03 DIAGNOSIS — Z95810 Presence of automatic (implantable) cardiac defibrillator: Secondary | ICD-10-CM | POA: Diagnosis not present

## 2014-09-03 DIAGNOSIS — Z87891 Personal history of nicotine dependence: Secondary | ICD-10-CM | POA: Diagnosis not present

## 2014-09-03 HISTORY — DX: Pressure ulcer of unspecified ankle, unspecified stage: L89.509

## 2014-09-03 HISTORY — PX: PERIPHERAL VASCULAR CATHETERIZATION: SHX172C

## 2014-09-03 HISTORY — DX: Thyrotoxicosis, unspecified without thyrotoxic crisis or storm: E05.90

## 2014-09-03 HISTORY — DX: Reserved for inherently not codable concepts without codable children: IMO0001

## 2014-09-03 HISTORY — DX: Acute myocardial infarction, unspecified: I21.9

## 2014-09-03 HISTORY — DX: Presence of automatic (implantable) cardiac defibrillator: Z95.810

## 2014-09-03 LAB — BUN: BUN: 99 mg/dL — AB (ref 6–20)

## 2014-09-03 LAB — GLUCOSE, CAPILLARY: GLUCOSE-CAPILLARY: 273 mg/dL — AB (ref 65–99)

## 2014-09-03 LAB — CREATININE, SERUM
Creatinine, Ser: 1.94 mg/dL — ABNORMAL HIGH (ref 0.61–1.24)
GFR calc Af Amer: 39 mL/min — ABNORMAL LOW (ref >60)
GFR calc non Af Amer: 34 mL/min — ABNORMAL LOW (ref >60)

## 2014-09-03 SURGERY — LOWER EXTREMITY ANGIOGRAPHY
Anesthesia: Moderate Sedation | Laterality: Right

## 2014-09-03 MED ORDER — MIDAZOLAM HCL 2 MG/2ML IJ SOLN
INTRAMUSCULAR | Status: DC | PRN
  Administered 2014-09-03: 1 mg via INTRAVENOUS
  Administered 2014-09-03: 2 mg via INTRAVENOUS
  Administered 2014-09-03: 1 mg via INTRAVENOUS

## 2014-09-03 MED ORDER — ACETAMINOPHEN 325 MG RE SUPP
325.0000 mg | RECTAL | Status: DC | PRN
Start: 2014-09-03 — End: 2014-09-03

## 2014-09-03 MED ORDER — LABETALOL HCL 5 MG/ML IV SOLN: 10.0000 mg | INTRAVENOUS | Status: DC | PRN

## 2014-09-03 MED ORDER — ACETAMINOPHEN 325 MG PO TABS
325.0000 mg | ORAL_TABLET | ORAL | Status: DC | PRN
Start: 2014-09-03 — End: 2014-09-03

## 2014-09-03 MED ORDER — HYDRALAZINE HCL 20 MG/ML IJ SOLN: 5.0000 mg | INTRAMUSCULAR | Status: DC | PRN

## 2014-09-03 MED ORDER — PANTOPRAZOLE SODIUM 40 MG PO TBEC: 40.0000 mg | DELAYED_RELEASE_TABLET | Freq: Every day | ORAL | Status: DC

## 2014-09-03 MED ORDER — CEFAZOLIN SODIUM 1-5 GM-% IV SOLN
INTRAVENOUS | Status: AC
  Filled 2014-09-03: qty 50

## 2014-09-03 MED ORDER — FENTANYL CITRATE (PF) 100 MCG/2ML IJ SOLN
INTRAMUSCULAR | Status: AC
  Filled 2014-09-03: qty 2

## 2014-09-03 MED ORDER — HEPARIN SODIUM (PORCINE) 1000 UNIT/ML IJ SOLN
INTRAMUSCULAR | Status: DC | PRN
  Administered 2014-09-03: 4000 [IU] via INTRAVENOUS

## 2014-09-03 MED ORDER — ONDANSETRON HCL 4 MG/2ML IJ SOLN: 4.0000 mg | Freq: Four times a day (QID) | INTRAMUSCULAR | Status: DC | PRN

## 2014-09-03 MED ORDER — HEPARIN (PORCINE) IN NACL 2-0.9 UNIT/ML-% IJ SOLN
INTRAMUSCULAR | Status: AC
  Filled 2014-09-03: qty 1000

## 2014-09-03 MED ORDER — FENTANYL CITRATE (PF) 100 MCG/2ML IJ SOLN
INTRAMUSCULAR | Status: AC
Start: 2014-09-03 — End: 2014-09-03
  Filled 2014-09-03: qty 2

## 2014-09-03 MED ORDER — MAGNESIUM SULFATE 2 GM/50ML IV SOLN: 2.0000 g | Freq: Every day | INTRAVENOUS | Status: DC | PRN

## 2014-09-03 MED ORDER — FENTANYL CITRATE (PF) 100 MCG/2ML IJ SOLN
INTRAMUSCULAR | Status: DC | PRN
  Administered 2014-09-03 (×3): 50 ug via INTRAVENOUS

## 2014-09-03 MED ORDER — DEXTROSE 5 % IV SOLN: 1.5000 g | Freq: Two times a day (BID) | INTRAVENOUS | Status: DC

## 2014-09-03 MED ORDER — SODIUM CHLORIDE 0.9 % IV SOLN
500.0000 mL | Freq: Once | INTRAVENOUS | Status: DC | PRN
Start: 2014-09-03 — End: 2014-09-03

## 2014-09-03 MED ORDER — METOPROLOL TARTRATE 1 MG/ML IV SOLN: 2.0000 mg | INTRAVENOUS | Status: DC | PRN

## 2014-09-03 MED ORDER — MIDAZOLAM HCL 5 MG/5ML IJ SOLN
INTRAMUSCULAR | Status: AC
Start: 2014-09-03 — End: 2014-09-03
  Filled 2014-09-03: qty 5

## 2014-09-03 MED ORDER — LIDOCAINE-EPINEPHRINE (PF) 1 %-1:200000 IJ SOLN
INTRAMUSCULAR | Status: DC | PRN
  Administered 2014-09-03: 10 mL via INTRADERMAL

## 2014-09-03 MED ORDER — LIDOCAINE HCL (PF) 1 % IJ SOLN
INTRAMUSCULAR | Status: AC
  Filled 2014-09-03: qty 10

## 2014-09-03 MED ORDER — POTASSIUM CHLORIDE CRYS ER 20 MEQ PO TBCR: 20.0000 meq | EXTENDED_RELEASE_TABLET | Freq: Every day | ORAL | Status: DC | PRN

## 2014-09-03 MED ORDER — SODIUM BICARBONATE BOLUS VIA INFUSION
INTRAVENOUS | Status: AC
  Administered 2014-09-03: 08:00:00 via INTRAVENOUS
  Filled 2014-09-03: qty 1

## 2014-09-03 MED ORDER — ALUM & MAG HYDROXIDE-SIMETH 200-200-20 MG/5ML PO SUSP: 15.0000 mL | ORAL | Status: DC | PRN

## 2014-09-03 MED ORDER — SODIUM CHLORIDE 0.9 % IV SOLN
INTRAVENOUS | Status: DC
  Administered 2014-09-03: 08:00:00 via INTRAVENOUS

## 2014-09-03 MED ORDER — GUAIFENESIN-DM 100-10 MG/5ML PO SYRP: 15.0000 mL | ORAL_SOLUTION | ORAL | Status: DC | PRN

## 2014-09-03 MED ORDER — CEFAZOLIN SODIUM 1-5 GM-% IV SOLN
1.0000 g | Freq: Once | INTRAVENOUS | Status: AC
  Administered 2014-09-03: 1 g via INTRAVENOUS

## 2014-09-03 MED ORDER — HEPARIN SODIUM (PORCINE) 1000 UNIT/ML IJ SOLN
INTRAMUSCULAR | Status: AC
  Filled 2014-09-03: qty 1

## 2014-09-03 MED ORDER — SODIUM BICARBONATE 8.4 % IV SOLN
INTRAVENOUS | Status: AC
  Administered 2014-09-03: 08:00:00 via INTRAVENOUS
  Filled 2014-09-03: qty 500

## 2014-09-03 MED ORDER — DOCUSATE SODIUM 100 MG PO CAPS: 100.0000 mg | ORAL_CAPSULE | Freq: Every day | ORAL | Status: DC

## 2014-09-03 MED ORDER — PHENOL 1.4 % MT LIQD: 1.0000 | OROMUCOSAL | Status: DC | PRN

## 2014-09-03 MED ORDER — IOHEXOL 300 MG/ML  SOLN
INTRAMUSCULAR | Status: DC | PRN
  Administered 2014-09-03: 65 mL via INTRA_ARTERIAL

## 2014-09-03 SURGICAL SUPPLY — 11 items
CATH 5FX125 BOWTIP (CATHETERS) ×3 IMPLANT
CATH ROYAL FLUSH PIG 5F 70CM (CATHETERS) ×3 IMPLANT
DEVICE STARCLOSE SE CLOSURE (Vascular Products) ×3 IMPLANT
GLIDEWIRE ANGLED SS 035X260CM (WIRE) ×3 IMPLANT
PACK ANGIOGRAPHY (CUSTOM PROCEDURE TRAY) ×3 IMPLANT
SET INTRO CAPELLA COAXIAL (SET/KITS/TRAYS/PACK) ×3 IMPLANT
SHEATH BRITE TIP 5FRX11 (SHEATH) ×3 IMPLANT
SHEATH RAABE 6FR (SHEATH) ×3 IMPLANT
SYR MEDRAD MARK V 150ML (SYRINGE) ×3 IMPLANT
TUBING CONTRAST HIGH PRESS 72 (TUBING) ×3 IMPLANT
WIRE J 3MM .035X145CM (WIRE) ×3 IMPLANT

## 2014-09-03 NOTE — Op Note (Signed)
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  Abdominal aortogram  2.  Right lower extremity distal runoff third order catheter placement  3.  Additional right third order catheter placement   Indications:  Patient presents to the office referred by the wound care center secondary nonhealing ulceration of the right ankle. He has nonpalpable pulses and by duplex ultrasound has monophasic flow at the ankle level  Procedure:  Nicholas FearingFreddie Edward Parkeris a 69 y.o. male who was identified and appropriate procedural time out was performed.  The patient was then placed supine on the table and prepped and draped in the usual sterile fashion.  Ultrasound was used to evaluate the right common femoral artery.  It was patent .  A digital ultrasound image was acquired.  A micropuncture needle was used to access the right common femoral artery under direct ultrasound guidance and a permanent image was recorded.  A 0.018 J wire was advanced without resistance and a 5Fr sheath was placed.    Pigtail catheter was then advanced to the level of T12 and AP projection of the aorta was obtained. The brachial catheter is then repositioned to above the aortic bifurcation and an LAO view of the pelvis is obtained. Pigtail catheter was advanced over the bifurcation into the right iliac system with the assistance of a stiff angled Glidewire. Catheter is positioned with its tip in the distal external iliac on the right and an RAO projection of the right groin is obtained. Glidewire was then reinserted and the catheters negotiated into the SFA. Distal runoff is then obtained. Images below the knee are inadequate and therefore the wires reintroduced  and a 6 French rabies sheath is advanced up and over the bifurcation over the stiff angled Glidewire. Straight catheter is then negotiated with the wire down to the distal popliteal. Subsequently the wire and catheter negotiated into the posterior tibial. Selective imaging of the posterior tibials performed. Following this the catheters pulled back into the popliteal and the angled glide wire is negotiated into the anterior tibial and subsequently selective imaging of the anterior tibial is performed over the catheter which is then advanced into the anterior tibial artery. After review these images the wires reintroduced the sheath is pulled back to the left external iliac and LAO view of the left groin is obtained and a Star close device deployed without difficulty. There are no immediate Complications.  Findings:   Aortogram:  Demonstrates diffuse atherosclerotic changes but no hemodynamically significant lesions. The right and left common and external iliac arteries are widely patent as well.  Right Lower Extremity:  The right common femoral profunda femoris superficial femoral and popliteal arteries are widely patent. The trifurcation on the right appears patent however there is approximately 30% diameter reduction at the origin of the anterior tibial. Anterior tibial somewhat small but is continuous to the dorsalis pedis. The tibioperoneal trunk and posterior tibial artery are generous in size there is mild irregularities consistent with atherosclerotic changes however there are no hemodynamically significant lesions. 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 The posterior tibial fills the lateral plantar which feeds the pedal arch. Of note throughout the study the patient's iliac output is quite low and the contrast takes quite a long time to flow distally in all images.  Left Lower Extremity:  Left common femoral, origin of the profunda and proximal SFA are widely  patent.    Disposition: Patient was taken to the recovery room in stable condition having tolerated the procedure well.  Arrow Tomko, Latina Craver 08/20/2014 1:15 PM   09/03/2014,9:19 AM

## 2014-09-03 NOTE — Discharge Instructions (Signed)

## 2014-09-03 NOTE — H&P (Signed)
[facility] History & Physical Update  The patient was interviewed and re-examined.  The patient's previous History and Physical has been reviewed and is unchanged.  There is no change in the plan of care.  Schnier, Latina CraverGregory G, MD  09/03/2014, 8:14 AM

## 2014-09-03 NOTE — H&P (Signed)
[facility] History & Physical Update  The patient was interviewed and re-examined.  The patient's previous History and Physical has been reviewed and is unchanged.  There is no change in the plan of care.  Nicholas Leonard, Latina CraverGregory G, MD  09/03/2014, 8:37 AM

## 2014-09-03 NOTE — OR Nursing (Signed)
Blood drawn at 7:20 by lab tech. For BUN and Cr.

## 2014-09-05 ENCOUNTER — Encounter: Payer: Medicare Other | Admitting: Surgery

## 2014-09-05 DIAGNOSIS — L97122 Non-pressure chronic ulcer of left thigh with fat layer exposed: Secondary | ICD-10-CM | POA: Diagnosis not present

## 2014-09-06 ENCOUNTER — Encounter: Payer: Self-pay | Admitting: Vascular Surgery

## 2014-09-06 NOTE — Progress Notes (Signed)
Nicholas, Leonard ([id number]) Visit Report for 09/05/2014 Arrival Information Details Patient Name: Nicholas Leonard, Nicholas Leonard. Date of Service: 09/05/2014 1:15 PM Medical Record Number: [id number] Patient Account Number: [id number] Date of Birth/Sex: [birth year] (69 y.o. Male) Treating RN: Huel Coventry Primary Care Physician: Daniel Nones Other Clinician: Referring Physician: Daniel Nones Treating Physician/Extender: BURNS III, Regis Bill in Treatment: 8 Visit Information History Since Last Visit Added or deleted any medications: No Patient Arrived: Ambulatory Any new allergies or adverse reactions: No Arrival Time: 13:37 Had a fall or experienced change in No Accompanied By: self activities of daily living that may affect Transfer Assistance: None risk of falls: Patient Identification Verified: Yes Signs or symptoms of abuse/neglect since last No Secondary Verification Process Yes visito Completed: Hospitalized since last visit: No Patient Requires Transmission- No Has Dressing in Place as Prescribed: Yes Based Precautions: Pain Present Now: No Patient Has Alerts: Yes Patient Alerts: Patient on Blood Thinner coumadin DMII ABI L 1.75 R 1.55 Electronic Signature(s) Signed: 09/05/2014 4:59:01 PM By: Elliot Gurney, RN, BSN, Kim RN, BSN Entered By: Elliot Gurney, RN, BSN, Kim on 09/05/2014 13:37:52 Nicholas Leonard ([id number]) -------------------------------------------------------------------------------- Encounter Discharge Information Details Patient Name: Nicholas Leonard, Nicholas Leonard. Date of Service: 09/05/2014 1:15 PM Medical Record Number: [id number] Patient Account Number: [id number] Date of Birth/Sex: Jan 11, 1946 (69 y.o. Male) Treating RN: Primary Care Physician: Daniel Nones Other Clinician: Referring Physician: Daniel Nones Treating Physician/Extender: BURNS III, Regis Bill in Treatment: 8 Encounter Discharge Information Items Schedule Follow-up Appointment: No Medication Reconciliation  completed No and provided to Patient/Care [name]: Provided on Clinical Summary of Care: 09/05/2014 Form Type Recipient Paper Patient FP Electronic Signature(s) Signed: 09/05/2014 2:03:29 PM By: Gwenlyn Perking Entered By: Gwenlyn Perking on 09/05/2014 14:03:29 Nicholas Leonard ([id number]) -------------------------------------------------------------------------------- Lower Extremity Assessment Details Patient Name: Nicholas, Leonard. Date of Service: 09/05/2014 1:15 PM Medical Record Number: [id number] Patient Account Number: [id number] Date of Birth/Sex: Jan 18, 1946 (69 y.o. Male) Treating RN: Huel Coventry Primary Care Physician: Daniel Nones Other Clinician: Referring Physician: Daniel Nones Treating Physician/Extender: BURNS III, Regis Bill in Treatment: 8 Edema Assessment Assessed: [Left: No] [Right: No] E[Left: dema] [Right: :] Calf Left: Right: Point of Measurement: 39 cm From Medial Instep cm 34.5 cm Ankle Left: Right: Point of Measurement: 12 cm From Medial Instep cm 23.2 cm Vascular Assessment Claudication: Claudication Assessment [Right:None] Pulses: Posterior Tibial Dorsalis Pedis Palpable: [Right:Yes] Extremity colors, hair growth, and conditions: Extremity Color: [Right:Hyperpigmented] Hair Growth on Extremity: [Right:No] Temperature of Extremity: [Right:Warm] Capillary Refill: [Right:< 3 seconds] Toe Nail Assessment Left: Right: Thick: Yes Discolored: Yes Deformed: No Improper Length and Hygiene: Yes Electronic Signature(s) Signed: 09/05/2014 4:59:01 PM By: Elliot Gurney, RN, BSN, Kim RN, BSN Entered By: Elliot Gurney, RN, BSN, Kim on 09/05/2014 13:41:17 Nicholas Leonard ([id number]) Nicholas Leonard ([id number]) -------------------------------------------------------------------------------- Multi Wound Chart Details Patient Name: Nicholas, Leonard. Date of Service: 09/05/2014 1:15 PM Medical Record Number: [id number] Patient Account Number: [id number] Date of  Birth/Sex: 10/16/1945 (69 y.o. 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] Comorbid History: [1:Congestive Heart Failure, N/A Coronary Artery Disease, Hypertension, Type II Diabetes, Gout, Neuropathy] Date Acquired: [  1:06/10/2014] [N/A:N/A] Weeks of Treatment: [1:8] [N/A:N/A] Wound Status: [1:Open] [N/A:N/A] Measurements L x W x D 2.6x2x0.3 [N/A:N/A] (cm) Area (cm) : [1:4.084] [N/A:N/A] Volume (cm) : [1:1.225] [N/A:N/A] % Reduction in Area: [1:-224.90%] [N/A:N/A] % Reduction in Volume: -872.20% [N/A:N/A] Classification: [1:Partial Thickness] [N/A:N/A] HBO Classification: [1:Grade 1] [N/A:N/A] Exudate Amount: [1:Large] [N/A:N/A] Exudate Type: [1:Serous] [N/A:N/A] Exudate Color: [1:amber] [N/A:N/A] Wound Margin: [1:Distinct, outline attached N/A] Granulation Amount: [1:Medium (34-66%)] [N/A:N/A] Granulation Quality: [1:Red, Hyper-granulation] [N/A:N/A] Necrotic Amount: [1:Medium (34-66%)] [N/A:N/A] Exposed Structures: [N/A:N/A] Fascia: No Fat: No Tendon: No Muscle: No Joint: No Bone: No Limited to Skin Breakdown Epithelialization: Small (1-33%) N/A N/A Periwound Skin Texture: Edema: Yes N/A N/A Excoriation: No Induration: No Callus: No Crepitus: No Fluctuance: No Friable: No Rash: No Scarring: No Periwound Skin Moist: Yes N/A N/A Moisture: Maceration: No Dry/Scaly: No Periwound Skin Color:  Atrophie Blanche: No N/A N/A Cyanosis: No Ecchymosis: No Erythema: No Hemosiderin Staining: No Mottled: No Pallor: No Rubor: No Temperature: No Abnormality N/A N/A Tenderness on Yes N/A N/A Palpation: Wound Preparation: Ulcer Cleansing: N/A N/A Rinsed/Irrigated with Saline Topical Anesthetic Applied: Other: lidocaine 4% Treatment Notes Electronic Signature(s) Signed: 09/05/2014 4:59:01 PM By: Elliot Gurney, RN, BSN, Kim RN, BSN Entered By: Elliot Gurney, RN, BSN, Kim on 09/05/2014 13:48:15 Nicholas Leonard ([id number]) -------------------------------------------------------------------------------- Multi-Disciplinary Care Plan Details Patient Name: ONIE, HAYASHI. Date of Service: 09/05/2014 1:15 PM Medical Record Number: [id number] Patient Account Number: [id number] Date of Birth/Sex: [birth year] (69 y.o. Male) Treating RN: Huel Coventry Primary Care Physician: Daniel Nones Other Clinician: Referring Physician: Daniel Nones Treating Physician/Extender: BURNS III, Regis Bill in Treatment: 8 Active Inactive Nutrition Nursing Diagnoses: Impaired glucose control: actual or potential Potential for alteratiion in Nutrition/Potential for imbalanced nutrition Goals: Patient/caregiver verbalizes understanding of need to maintain therapeutic glucose control per primary care physician Date Initiated: 07/09/2014 Goal Status: Active Patient/caregiver will maintain therapeutic glucose control Date Initiated: 07/09/2014 Goal Status: Active Interventions: Assess patient nutrition upon admission and as needed per policy Provide education on elevated blood sugars and impact on wound healing Provide education on nutrition Treatment Activities: Education provided on Nutrition : 07/09/2014 Notes: Orientation to the Wound Care Program Nursing Diagnoses: Knowledge deficit related to the wound healing center program Goals: Patient/caregiver will verbalize understanding of the Wound Healing Center  Program Date Initiated: 07/09/2014 Goal Status: Active Interventions: Provide education on orientation to the wound center CAIDENCE, HIGASHI. ([id number]) Notes: Venous Leg Ulcer Nursing Diagnoses: Knowledge deficit related to disease process and management Potential for venous Insuffiency (use before diagnosis confirmed) Goals: Patient will maintain optimal edema control Date Initiated: 07/09/2014 Goal Status: Active Verify adequate tissue perfusion prior to therapeutic compression application Date Initiated: 07/09/2014 Goal Status: Active Interventions: Assess peripheral edema status every visit. Compression as ordered Provide education on venous insufficiency Treatment Activities: Test ordered outside of clinic : 09/05/2014 Therapeutic compression applied : 09/05/2014 Notes: Wound/Skin Impairment Nursing Diagnoses: Knowledge deficit related to ulceration/compromised skin integrity Goals: Patient/caregiver will verbalize understanding of skin care regimen Date Initiated: 07/09/2014 Goal Status: Active Ulcer/skin breakdown will heal within 14 weeks Date Initiated: 07/09/2014 Goal Status: Active Interventions: Assess patient/caregiver ability to obtain necessary supplies Assess patient/caregiver ability to perform ulcer/skin care regimen upon admission and as needed Assess ulceration(s) every visit Provide education on ulcer and skin care UGO, THOMA ([id number]) Treatment Activities: Patient referred to home care : 09/05/2014 Skin care regimen initiated : 09/05/2014 Topical wound management initiated : 09/05/2014 Notes: Electronic Signature(s) Signed: 09/05/2014 4:59:01 PM By: Elliot Gurney, RN, BSN, Kim RN, BSN  Entered By: Elliot GurneyWoody, RN, BSN, Kim on 09/05/2014 13:47:32 Galbraith, Beatrix FettersFREDDIE E. ([id number]) -------------------------------------------------------------------------------- Pain Assessment Details Patient Name: Nicholas Leonard, Nicholas E. Date of Service: 09/05/2014 1:15 PM Medical  Record Number: [id number] Patient Account Number: [id number] Date of Birth/Sex: 19-Jun-1945 [age] y.o. Male) Treating RN: Huel CoventryWoody, Kim Primary Care Physician: Daniel NonesKLEIN, BERT Other Clinician: Referring Physician: Daniel NonesKLEIN, BERT Treating Physician/Extender: BURNS III, Regis BillWALTER Weeks in Treatment: 8 Active Problems Location of Pain Severity and Description of Pain Patient Has Paino No Site Locations Pain Management and Medication Current Pain Management: Notes Patient states it stings occasionally Electronic Signature(s) Signed: 09/05/2014 4:59:01 PM By: Elliot GurneyWoody, RN, BSN, Kim RN, BSN Entered By: Elliot GurneyWoody, RN, BSN, Kim on 09/05/2014 13:38:15 Nicholas Leonard, Nicholas E. ([id number]) -------------------------------------------------------------------------------- Wound Assessment Details Patient Name: Nicholas Leonard, Nicholas E. Date of Service: 09/05/2014 1:15 PM Medical Record Number: [id number] Patient Account Number: [id number] Date of Birth/Sex: 19-Jun-1945 [age] y.o. Male) Treating RN: Huel CoventryWoody, Kim Primary Care Physician: Daniel NonesKLEIN, BERT Other Clinician: Referring Physician: Daniel NonesKLEIN, BERT Treating Physician/Extender: BURNS III, Regis BillWALTER Weeks in Treatment: 8 Wound Status Wound Number: 1 Primary Arterial Insufficiency Ulcer Etiology: Wound Location: Right Lower Leg - Medial Secondary Diabetic Wound/Ulcer of the Lower Wounding Event: Gradually Appeared Etiology: Extremity Date Acquired: 06/10/2014 Wound Open Weeks Of Treatment: 8 Status: Clustered Wound: No Comorbid Congestive Heart Failure, Coronary History: Artery Disease, Hypertension, Type II Diabetes, Gout, Neuropathy Photos Photo Uploaded By: Elliot GurneyWoody, RN, BSN, Kim on 09/05/2014 15:41:36 Wound Measurements Length: (cm) 2.6 Width: (cm) 2 Depth: (cm) 0.3 Area: (cm) 4.084 Volume: (cm) 1.225 % Reduction in Area: -224.9% % Reduction in Volume: -872.2% Epithelialization: Small (1-33%) Tunneling: No Undermining: No Wound Description Classification: Partial  Thickness Diabetic Severity (Wagner): Grade 1 Wound Margin: Distinct, outline attache Exudate Amount: Large Exudate Type: Serous Exudate Color: amber Foul Odor After Cleansing: No d Wound Bed Granulation Amount: Medium (34-66%) Exposed Structure Hollifield, Luismanuel E. ([id number]) Granulation Quality: Red, Hyper-granulation Fascia Exposed: No Necrotic Amount: Medium (34-66%) Fat Layer Exposed: No Necrotic Quality: Adherent Slough Tendon Exposed: No Muscle Exposed: No Joint Exposed: No Bone Exposed: No Limited to Skin Breakdown Periwound Skin Texture Texture Color No Abnormalities Noted: No No Abnormalities Noted: No Callus: No Atrophie Blanche: No Crepitus: No Cyanosis: No Excoriation: No Ecchymosis: No Fluctuance: No Erythema: No Friable: No Hemosiderin Staining: No Induration: No Mottled: No Localized Edema: Yes Pallor: No Rash: No Rubor: No Scarring: No Temperature / Pain Moisture Temperature: No Abnormality No Abnormalities Noted: No Tenderness on Palpation: Yes Dry / Scaly: No Maceration: No Moist: Yes Wound Preparation Ulcer Cleansing: Rinsed/Irrigated with Saline Topical Anesthetic Applied: Other: lidocaine 4%, Electronic Signature(s) Signed: 09/05/2014 4:59:01 PM By: Elliot GurneyWoody, RN, BSN, Kim RN, BSN Entered By: Elliot GurneyWoody, RN, BSN, Kim on 09/05/2014 13:44:06 Strehlow, Beatrix FettersFREDDIE E. ([id number]) -------------------------------------------------------------------------------- Vitals Details Patient Name: Nicholas Leonard, Nicholas E. Date of Service: 09/05/2014 1:15 PM Medical Record Number: [id number] Patient Account Number: [id number] Date of Birth/Sex: 19-Jun-1945 [age] y.o. Male) Treating RN: Huel CoventryWoody, Kim Primary Care Physician: Daniel NonesKLEIN, BERT Other Clinician: Referring Physician: Daniel NonesKLEIN, BERT Treating Physician/Extender: BURNS III, Regis BillWALTER Weeks in Treatment: 8 Vital Signs Time Taken: 13:38 Temperature (F): 97.6 Height (in): 72 Pulse (bpm): 76 Weight (lbs): 215 Respiratory  Rate (breaths/min): 18 Body Mass Index (BMI): 29.2 Blood Pressure (mmHg): 103/63 Reference Range: 80 - 120 mg / dl Electronic Signature(s) Signed: 09/05/2014 4:59:01 PM By: Elliot GurneyWoody, RN, BSN, Kim RN, BSN Entered By: Elliot GurneyWoody, RN, BSN, Kim on 09/05/2014 13:38:37

## 2014-09-06 NOTE — Progress Notes (Signed)
Nicholas Leonard ([id number]) Visit Report for 09/05/2014 Chief Complaint Document Details Patient Name: Nicholas Leonard, Nicholas Leonard. Date of Service: 09/05/2014 1:15 PM Medical Record Number: [id number] Patient Account Number: [id number] Date of Birth/Sex: Sep 19, 1945 (69 y.o. Male) Treating RN: Primary Care Physician: Daniel Nones Other Clinician: Referring Physician: Daniel Nones Treating Physician/Extender: BURNS III, Regis Bill in Treatment: 8 Information Obtained from: Patient Chief Complaint Patient presents to the wound care center for a consult due non healing wound ulcer on the right lower limb which was noted to be there for about 3 weeks now. he is also had swelling of both lower extremities for about 2 months now. Electronic Signature(s) Signed: 09/05/2014 4:25:37 PM By: Madelaine Bhat MD Entered By: Madelaine Bhat on 09/05/2014 14:04:00 Nicholas Leonard ([id number]) -------------------------------------------------------------------------------- Debridement Details Patient Name: Nicholas Leonard. Date of Service: 09/05/2014 1:15 PM Medical Record Number: [id number] Patient Account Number: [id number] Date of Birth/Sex: 04/21/1945 (69 y.o. Male) Treating RN: Primary Care Physician: Daniel Nones Other Clinician: Referring Physician: Daniel Nones Treating Physician/Extender: BURNS III, WALTER Weeks in Treatment: 8 Debridement Performed for Wound #1 Right,Medial Lower Leg Assessment: Performed By: Physician BURNS III, WALTER, Debridement: Debridement Pre-procedure Yes Verification/Time Out Taken: Start Time: 13:50 Pain Control: Lidocaine 4% Topical Solution Level: Skin/Subcutaneous Tissue Total Area Debrided (L x 2.6 (cm) x 2 (cm) = 5.2 (cm) W): Tissue and other Viable, Non-Viable, Fat, Fibrin/Slough, Subcutaneous material debrided: Instrument: Curette Bleeding: Minimum Hemostasis Achieved: Pressure End Time: 13:55 Procedural Pain: 0 Post Procedural Pain:  0 Response to Treatment: Procedure was tolerated well Post Debridement Measurements of Total Wound Length: (cm) 2.6 Width: (cm) 2 Depth: (cm) 0.4 Volume: (cm) 1.634 Electronic Signature(s) Signed: 09/05/2014 4:25:37 PM By: Madelaine Bhat MD Entered By: Madelaine Bhat on 09/05/2014 14:03:27 Monsivais, [name] ([id number]) -------------------------------------------------------------------------------- HPI Details Patient Name: Nicholas Leonard. Date of Service: 09/05/2014 1:15 PM Medical Record Number: [id number] Patient Account Number: [id number] Date of Birth/Sex: 17-Oct-1945 (69 y.o. Male) Treating RN: Primary Care Physician: Daniel Nones Other Clinician: Referring Physician: Daniel Nones Treating Physician/Extender: BURNS III, Regis Bill in Treatment: 8 History of Present Illness HPI Description: 69 year old gentleman who is recently discharged from [facility] and was admitted between 3 18-3/22 by his primary care physician Dr. Daniel Nones. He has adult onset diabetes and was recently admitted for acute on chronic left-sided systolic congestive heart failure, acute on chronic renal failure, coronary artery disease, ischemic cardiomyopathy, ventricular fibrillation status post AICD implantation, history of atrial fibrillation on Coumadin therapy, hypothyroidism, history of venous stasis ulcer, pulmonary embolism with deep vein thrombosis on chronic Coumadin therapy, osteoarthritis and gout. He does not smoke any longer and occasionally has alcohol. as not been compliant with getting his blood sugars checked and does not know the last time he had a hemoglobin A1c done. Does not recall when the last ultrasound of his lower extremities was done but he does say that he has had clots in his legs. 07/30/2014 the patient is back to [place] after a recent admission and discharge from the hospital and he was there between 07/19/2014 and 07/26/2014. Reviewed these notes  and his final diagnosis was #1 elevated INR, #2 acute on chronic left-sided systolic congestive heart failure, #3 hypothyroidism, #4 gout, #5 chronic kidney disease stage III, #6 acute renal failure, #7 carotid artery disease, #8 history of pulmonary embolism and DVT, #9 adult onset diabetes mellitus. During the admission he was corrected with giving vitamin K and due to his difficulty with Coumadin  he was changed over to Eliquis. The patient also was found to have diffuse edema and there was evidence of ascites on abdominal ultrasound and was placed on a Lasix drip by nephrologist. His just discharge medications were noted by me. they have been using Santyl on his right lower extremity wound and he has been doing this dressing as often as the nurse comes in. he has also developed a ulcer of the left thigh and this has been there for about 2 weeks and has some necrosis of skin over there. 08/23/2014 -- he recently saw the vascular surgeon Dr. Gilda CreaseSchnier and has had both an arterial and duplex venous study done. Though we do not have the official report yet the patient tells me that his arteries were okay and there was no problem with the blood flow. 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 08/30/2014 -- his vascular surgery has been scheduled for Tuesday, May 24 and he will come back and see us if he needs to soon after that for a rewrap. Addendum: We have finally received his transcript of his office visit with Dr. Gilda CreaseSchnier on 08/22/2014 and note that the duplex US of the lower extremity arterial system bilaterally SFA are patent, with diffuse tibial disease with monophasic tibial signals on the right and biphasic signals on the left. Duplex Venous ultrasound of the lower extremity shows normal deep venous system, superficial reflux is present over a short segment of the GI suite in the region of the ulcer.  However the GSC from the knees to the groin is competent. The recommendations were for the patient to have angiography of the lower extremity with the hope of Nicholas LedgerRKER, Cevin E. ([id number]) intervention for limb salvage. Once the arterial compromise has been addressed then the venous portion can be more effectively treated. 09/05/2014 - s/p RLE angiogram without intervention 09/03/2014 by Dr Gilda CreaseSchnier. No new complaints today. No significant pain. No fever or chills. Minimal drainage. Electronic Signature(s) Signed: 09/05/2014 4:25:37 PM By: Madelaine BhatBurns, III, Walter MD Entered By: Madelaine BhatBurns, III, Walter on 09/05/2014 14:07:17 Nicholas LedgerPARKER, Nicholi E. ([id number]) -------------------------------------------------------------------------------- Physical Exam Details Patient Name: Nicholas LedgerRKER, Oscar E. Date of Service: 09/05/2014 1:15 PM Medical Record Number: [id number] Patient Account Number: [id number] Date of Birth/Sex: 12-Dec-1945 [age] y.o. Male) Treating RN: Primary Care Physician: Daniel NonesKLEIN, BERT Other Clinician: Referring Physician: Daniel NonesKLEIN, BERT Treating Physician/Extender: BURNS, Regis BillWALTER Weeks in Treatment: 8 Constitutional . Pulse regular. Respirations normal and unlabored. Afebrile. Marland Kitchen. Respiratory WNL. No retractions.. Cardiovascular . Integumentary (Hair, Skin) .Marland Kitchen. Neurological Sensation normal to touch, pin,and vibration. Psychiatric Judgement and insight Intact.. Oriented times 3.. No evidence of depression, anxiety, or agitation.. Notes Right medial calf ulcer. Full thickness. Punched-out margins. Debrided to healthy, bleeding subcutaneous fat. No exposed deep structures. No evidence for infection. 1+ pitting edema. No palpable pedal pulses per his baseline. Monophasic DP and PT. Foot feels adequately perfused. Electronic Signature(s) Signed: 09/05/2014 4:25:37 PM By: Madelaine BhatBurns, III, Walter MD Entered By: Madelaine BhatBurns, III, Walter on 09/05/2014 14:08:58 Nicholas LedgerPARKER, Valentin E.  ([id number]) -------------------------------------------------------------------------------- Physician Orders Details Patient Name: Nicholas LedgerRKER, Marcques E. Date of Service: 09/05/2014 1:15 PM Medical Record Number: [id number] Patient Account Number: [id number] Date of Birth/Sex: 12-Dec-1945 [age] y.o. Male) Treating RN: Huel CoventryWoody, Kim Primary Care Physician: Daniel NonesKLEIN, BERT Other Clinician: Referring Physician: Daniel NonesKLEIN, BERT Treating Physician/Extender: BURNS III, Regis BillWALTER Weeks in Treatment: 8 Verbal / Phone Orders: Yes Clinician: Huel CoventryWoody, Kim Read Back and Verified: Yes Diagnosis Coding Wound  Cleansing Wound #1 Right,Medial Lower Leg o May shower with protection. o No tub bath. Anesthetic Wound #1 Right,Medial Lower Leg o Topical Lidocaine 4% cream applied to wound bed prior to debridement Skin Barriers/Peri-Wound Care Wound #1 Right,Medial Lower Leg o Barrier cream Primary Wound Dressing Wound #1 Right,Medial Lower Leg o Acticoat 7 Secondary Dressing Wound #1 Right,Medial Lower Leg o ABD and Kerlix/Conform Dressing Change Frequency Wound #1 Right,Medial Lower Leg o Change dressing every week Follow-up Appointments Wound #1 Right,Medial Lower Leg o Return Appointment in 1 week. o Nurse Visit as needed - Re-wrap after vascular procedure Edema Control Wound #1 Right,Medial Lower Leg o 2 Layer Lite Compression System - Right Lower Extremity ISAEL, STILLE ([id number]) Home Health Wound #1 Right,Medial Lower Leg o Continue Home Health Visits o Home Health Nurse may visit PRN to address patientos wound care needs. o FACE TO FACE ENCOUNTER: MEDICARE and MEDICAID PATIENTS: I certify that this patient is under my care and that I had a face-to-face encounter that meets the physician face-to-face encounter requirements with this patient on this date. The encounter with the patient was in whole or in part for the following MEDICAL CONDITION: (primary reason for Home  Healthcare) MEDICAL NECESSITY: I certify, that based on my findings, NURSING services are a medically necessary home health service. HOME BOUND STATUS: I certify that my clinical findings support that this patient is homebound (i.e., Due to illness or injury, pt requires aid of supportive devices such as crutches, cane, wheelchairs, walkers, the use of special transportation or the assistance of another person to leave their place of residence. There is a normal inability to leave the home and doing so requires considerable and taxing effort. Other absences are for medical reasons / religious services and are infrequent or of short duration when for other reasons). o If current dressing causes regression in wound condition, may D/C ordered dressing product/s and apply Normal Saline Moist Dressing daily until next Wound Healing Center / Other MD appointment. Notify Wound Healing Center of regression in wound condition at [phone]. o Please direct any NON-WOUND related issues/requests for orders to patient's Primary Care Physician Electronic Signature(s) Signed: 09/05/2014 4:59:01 PM By: Elliot Gurney, RN, BSN, Kim RN, BSN Entered By: Elliot Gurney, RN, BSN, Kim on 09/05/2014 13:59:42 Ringold, [name] ([id number]) -------------------------------------------------------------------------------- Problem List Details Patient Name: DACIAN, ORRICO. Date of Service: 09/05/2014 1:15 PM Medical Record Number: [id number] Patient Account Number: [id number] Date of Birth/Sex: [birth year] (69 y.o. 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 Date of Service: 09/05/2014 1:15 PM Medical Record Number: [id number] Patient Account Number: [id number] Date of Birth/Sex: Jul 23, 1945 (68 y.o.  Male) Treating RN: Primary Care Physician: Daniel Nones Other Clinician: Referring Physician: Daniel Nones Treating Physician/Extender: BURNS, Regis Bill in Treatment: 8 Subjective Chief Complaint Information obtained from Patient Patient presents to the wound care center for a consult due non healing wound ulcer on the right lower limb which was noted to be there for about 3 weeks now. he is also had swelling of both lower extremities for about 2 months now. History of Present Illness (HPI) 69 year old gentleman who is recently discharged from [facility] and was admitted between 3 18-3/22 by his primary care physician Dr. Daniel Nones. He has adult onset diabetes and was recently admitted for acute on chronic left-sided systolic congestive heart failure, acute on chronic renal failure, coronary artery disease, ischemic cardiomyopathy, ventricular fibrillation status post AICD implantation, history of atrial fibrillation on Coumadin  therapy, hypothyroidism, history of venous stasis ulcer, pulmonary embolism with deep vein thrombosis on chronic Coumadin therapy, osteoarthritis and gout. He does not smoke any longer and occasionally has alcohol. as not been compliant with getting his blood sugars checked and does not know the last time he had a hemoglobin A1c done. Does not recall when the last ultrasound of his lower extremities was done but he does say that he has had clots in his legs. 07/30/2014 the patient is back to [place] after a recent admission and discharge from the hospital and he was there between 07/19/2014 and 07/26/2014. Reviewed these notes and his final diagnosis was #1 elevated INR, #2 acute on chronic left-sided systolic congestive heart failure, #3 hypothyroidism, #4 gout, #5 chronic kidney disease stage III, #6 acute renal failure, #7 carotid artery disease, #8 history of pulmonary embolism and DVT, #9 adult onset diabetes mellitus. During the admission he was corrected with giving vitamin K and due to his difficulty with Coumadin he was changed over to Eliquis. The patient also was found to have diffuse edema and there was evidence of ascites on abdominal ultrasound and was placed on a Lasix drip by nephrologist. His just discharge medications were noted by me. they have been using Santyl on his right lower extremity wound and he has been doing this dressing as often as the nurse comes in. he has also developed a ulcer of the left thigh and this has been there for about 2 weeks and has some necrosis of skin over there. 08/23/2014 -- he recently saw the vascular surgeon Dr. Gilda Crease and has had both an arterial and duplex venous study done. Though we do not have the official report yet the patient tells me that his arteries were okay and there was no problem with the blood flow. However he does have varicose veins and incompetence of the venous system and he is going to be scheduled for endovenous  ablation. 08/30/2014 -- his vascular surgery has been scheduled for Tuesday, May 24 and he will come back and see KAEDAN, RICHERT ([id number]) [place] if he needs to soon after that for a rewrap. Addendum: We have finally received his transcript of his office visit with Dr. Gilda Crease on 08/22/2014 and note that the duplex US of the lower extremity arterial system bilaterally SFA are patent, with diffuse tibial disease with monophasic tibial signals on the right and biphasic signals on the left. Duplex Venous ultrasound of the lower extremity shows normal deep venous system, superficial reflux is present over a short segment of the GI suite in the region of the ulcer. However the GSC from the knees to the groin is  competent. The recommendations were for the patient to have angiography of the lower extremity with the hope of intervention for limb salvage. Once the arterial compromise has been addressed then the venous portion can be more effectively treated. 09/05/2014 - s/p RLE angiogram without intervention 09/03/2014 by Dr Gilda Crease. No new complaints today. No significant pain. No fever or chills. Minimal drainage. Objective Constitutional Pulse regular. Respirations normal and unlabored. Afebrile. Vitals Time Taken: 1:38 PM, Height: 72 in, Weight: 215 lbs, BMI: 29.2, Temperature: 97.6 F, Pulse: 76 bpm, Respiratory Rate: 18 breaths/min, Blood Pressure: 103/63 mmHg. Respiratory WNL. No retractions.. Neurological Sensation normal to touch, pin,and vibration. Psychiatric Judgement and insight Intact.. Oriented times 3.. No evidence of depression, anxiety, or agitation.. General Notes: Right medial calf ulcer. Full thickness. Punched-out margins. Debrided to healthy, bleeding subcutaneous fat. No exposed deep structures. No evidence for infection. 1+ pitting edema. No palpable pedal pulses per his baseline. Monophasic DP and PT. Foot feels adequately perfused. Integumentary (Hair, Skin) Wound #1  status is Open. Original cause of wound was Gradually Appeared. The wound is located on the Right,Medial Lower Leg. The wound measures 2.6cm length x 2cm width x 0.3cm depth; 4.084cm^2 area and 1.225cm^3 volume. The wound is limited to skin breakdown. There is no tunneling or undermining noted. There is a large amount of serous drainage noted. The wound margin is distinct with the outline attached to the wound base. There is medium (34-66%) red granulation within the wound bed. There is a KYCEN, SPALLA. ([id number]) medium (34-66%) amount of necrotic tissue within the wound bed including Adherent Slough. The periwound skin appearance exhibited: Localized Edema, Moist. The periwound skin appearance did not exhibit: Callus, Crepitus, Excoriation, Fluctuance, Friable, Induration, Rash, Scarring, Dry/Scaly, Maceration, Atrophie Blanche, Cyanosis, Ecchymosis, Hemosiderin Staining, Mottled, Pallor, Rubor, Erythema. Periwound temperature was noted as No Abnormality. The periwound has tenderness on palpation. 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 Arterial insufficiency. Chronic venous insufficiency. Procedures Wound #1 Wound #1 is an Arterial Insufficiency Ulcer located on the Right,Medial Lower Leg . There was a Skin/Subcutaneous Tissue Debridement ([id number]) debridement with total area of 5.2 sq cm performed by BURNS III, WALTER. with the following instrument(s): Curette to  remove Viable and Non-Viable tissue/material including Fat, Fibrin/Slough, and Subcutaneous after achieving pain control using Lidocaine 4% Topical Solution. A time out was conducted prior to the start of the procedure. A Minimum amount of bleeding was controlled with Pressure. The procedure was tolerated well with a pain level of 0 throughout and a pain level of 0 following the procedure. Post Debridement Measurements: 2.6cm length x 2cm width x 0.4cm depth; 1.634cm^3 volume. Plan YOGI, ARTHER ([id number]) Wound Cleansing: Wound #1 Right,Medial Lower Leg: May shower with protection. No tub bath. Anesthetic: Wound #1 Right,Medial Lower Leg: Topical Lidocaine 4% cream applied to wound bed prior to debridement Skin Barriers/Peri-Wound Care: Wound #1 Right,Medial Lower Leg: Barrier cream Primary Wound Dressing: Wound #1 Right,Medial Lower Leg: Acticoat 7 Secondary Dressing: Wound #1 Right,Medial Lower Leg: ABD and Kerlix/Conform Dressing Change Frequency: Wound #1 Right,Medial  Lower Leg: Change dressing every week Follow-up Appointments: Wound #1 Right,Medial Lower Leg: Return Appointment in 1 week. Nurse Visit as needed - Re-wrap after vascular procedure Edema Control: Wound #1 Right,Medial Lower Leg: 2 Layer Lite Compression System - Right Lower Extremity Home Health: Wound #1 Right,Medial Lower Leg: Continue Home Health Visits Home Health Nurse may visit PRN to address patient s wound care needs. FACE TO FACE ENCOUNTER: MEDICARE and MEDICAID PATIENTS: I certify that this patient is under my care and that I had a face-to-face encounter that meets the physician face-to-face encounter requirements with this patient on this date. The encounter with the patient was in whole or in part for the following MEDICAL CONDITION: (primary reason for Home Healthcare) MEDICAL NECESSITY: I certify, that based on my findings, NURSING services are a medically necessary home health  service. HOME BOUND STATUS: I certify that my clinical findings support that this patient is homebound (i.e., Due to illness or injury, pt requires aid of supportive devices such as crutches, cane, wheelchairs, walkers, the use of special transportation or the assistance of another person to leave their place of residence. There is a normal inability to leave the home and doing so requires considerable and taxing effort. Other absences are for medical reasons / religious services and are infrequent or of short duration when for other reasons). If current dressing causes regression in wound condition, may D/C ordered dressing product/s and apply Normal Saline Moist Dressing daily until next Wound Healing Center / Other MD appointment. Notify Wound Healing Center of regression in wound condition at [phone]. Please direct any NON-WOUND related issues/requests for orders to patient's Primary Care Physician NAHSHON, REICH ([id number]) Continue Acticoat and 2 layer compression. Electronic Signature(s) Signed: 09/05/2014 4:25:37 PM By: Madelaine Bhat MD Entered By: Madelaine Bhat on 09/05/2014 14:09:43 Harston, [name] ([id number]) -------------------------------------------------------------------------------- SuperBill Details Patient Name: Nicholas Leonard Date of Service: 09/05/2014 Medical Record Number: [id number] Patient Account Number: [id number] Date of Birth/Sex: June 21, 1945 (69 y.o. 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 L97.122 Non-pressure chronic ulcer of left thigh with fat layer exposed I70.232 Atherosclerosis of native arteries of right leg with ulceration of calf Facility Procedures CPT4 Code Description: 29528413 11042 - DEB SUBQ TISSUE 20 SQ CM/< ICD-10 Description Diagnosis I87.311 Chronic venous hypertension (idiopathic) with ulcer of Modifier: right lower ex Quantity: 1 tremity Physician Procedures CPT4 Code Description: [id number] WC PHYS LEVEL 3 o NEW PT ICD-10 Description Diagnosis E11.622 Type 2 diabetes mellitus with other skin ulcer I70.232 Atherosclerosis of native arteries of right leg with ul Modifier: ceration of cal Quantity: 1 f CPT4 Code Description: 7253664 [id number] - WC PHYS SUBQ TISS 20 SQ CM ICD-10 Description Diagnosis 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

## 2014-09-13 ENCOUNTER — Encounter: Payer: Medicare Other | Attending: Surgery | Admitting: Surgery

## 2014-09-13 DIAGNOSIS — I87011 Postthrombotic syndrome with ulcer of right lower extremity: Secondary | ICD-10-CM | POA: Diagnosis not present

## 2014-09-13 DIAGNOSIS — E11622 Type 2 diabetes mellitus with other skin ulcer: Secondary | ICD-10-CM | POA: Insufficient documentation

## 2014-09-13 DIAGNOSIS — R600 Localized edema: Secondary | ICD-10-CM | POA: Insufficient documentation

## 2014-09-13 DIAGNOSIS — N179 Acute kidney failure, unspecified: Secondary | ICD-10-CM | POA: Insufficient documentation

## 2014-09-13 DIAGNOSIS — L97919 Non-pressure chronic ulcer of unspecified part of right lower leg with unspecified severity: Secondary | ICD-10-CM | POA: Diagnosis present

## 2014-09-13 DIAGNOSIS — I87311 Chronic venous hypertension (idiopathic) with ulcer of right lower extremity: Secondary | ICD-10-CM | POA: Insufficient documentation

## 2014-09-13 DIAGNOSIS — L97122 Non-pressure chronic ulcer of left thigh with fat layer exposed: Secondary | ICD-10-CM | POA: Insufficient documentation

## 2014-09-13 DIAGNOSIS — I80291 Phlebitis and thrombophlebitis of other deep vessels of right lower extremity: Secondary | ICD-10-CM | POA: Diagnosis not present

## 2014-09-13 DIAGNOSIS — N183 Chronic kidney disease, stage 3 (moderate): Secondary | ICD-10-CM | POA: Insufficient documentation

## 2014-09-13 DIAGNOSIS — I70232 Atherosclerosis of native arteries of right leg with ulceration of calf: Secondary | ICD-10-CM | POA: Diagnosis not present

## 2014-09-13 DIAGNOSIS — Z86711 Personal history of pulmonary embolism: Secondary | ICD-10-CM | POA: Insufficient documentation

## 2014-09-13 DIAGNOSIS — Z86718 Personal history of other venous thrombosis and embolism: Secondary | ICD-10-CM | POA: Diagnosis not present

## 2014-09-13 DIAGNOSIS — I5021 Acute systolic (congestive) heart failure: Secondary | ICD-10-CM | POA: Insufficient documentation

## 2014-09-13 DIAGNOSIS — E039 Hypothyroidism, unspecified: Secondary | ICD-10-CM | POA: Insufficient documentation

## 2014-09-14 NOTE — Progress Notes (Signed)
Nicholas Leonard, Nicholas E. ([id number]) Visit Report for 09/13/2014 Arrival Information Details Patient Name: Nicholas Leonard, Nicholas E. Date of Service: 09/13/2014 1:00 PM Medical Record Number: [id number] Patient Account Number: [id number] Date of Birth/Sex: January 19, 1946 [age] y.o. Male) Treating RN: Curtis Sitesorthy, Joanna Primary Care Physician: Daniel NonesKLEIN, BERT Other Clinician: Referring Physician: Daniel NonesKLEIN, BERT Treating Physician/Extender: Rudene ReBritto, Errol Weeks in Treatment: 9 Visit Information History Since Last Visit Added or deleted any medications: No Patient Arrived: Ambulatory Any new allergies or adverse reactions: No Arrival Time: 13:12 Had a fall or experienced change in No Accompanied By: self activities of daily living that may affect Transfer Assistance: None risk of falls: Patient Identification Verified: Yes Signs or symptoms of abuse/neglect since last No Secondary Verification Process Yes visito Completed: Hospitalized since last visit: No Patient Requires Transmission- No Pain Present Now: No Based Precautions: Patient Has Alerts: Yes Patient Alerts: Patient on Blood Thinner coumadin DMII ABI L 1.75 R 1.55 Electronic Signature(s) Signed: 09/13/2014 5:16:47 PM By: Curtis Sitesorthy, Joanna Entered By: Curtis Sitesorthy, Joanna on 09/13/2014 13:12:33 Nicholas Leonard, Nicholas E. ([id number]) -------------------------------------------------------------------------------- Encounter Discharge Information Details Patient Name: Nicholas Leonard, Nicholas E. Date of Service: 09/13/2014 1:00 PM Medical Record Number: [id number] Patient Account Number: [id number] Date of Birth/Sex: January 19, 1946 [age] y.o. Male) Treating RN: Primary Care Physician: Daniel NonesKLEIN, BERT Other Clinician: Referring Physician: Daniel NonesKLEIN, BERT Treating Physician/Extender: Rudene ReBritto, Errol Weeks in Treatment: 9 Encounter Discharge Information Items Discharge Pain Level: 0 Discharge Condition: Stable Ambulatory Status: Ambulatory Discharge Destination: Home Transportation:  Private Auto Accompanied By: self Schedule Follow-up Appointment: Yes Medication Reconciliation completed and provided to Patient/Care No Burleigh Brockmann: Provided on Clinical Summary of Care: 09/13/2014 Form Type Recipient Paper Patient FP Electronic Signature(s) Signed: 09/13/2014 5:16:47 PM By: Curtis Sitesorthy, Joanna Previous Signature: 09/13/2014 2:01:36 PM Version By: Gwenlyn PerkingMoore, Shelia Entered By: Curtis Sitesorthy, Joanna on 09/13/2014 14:04:05 Nicholas Leonard, Nicholas E. ([id number]) -------------------------------------------------------------------------------- Lower Extremity Assessment Details Patient Name: Nicholas Leonard, Nicholas E. Date of Service: 09/13/2014 1:00 PM Medical Record Number: [id number] Patient Account Number: [id number] Date of Birth/Sex: January 19, 1946 [age] y.o. Male) Treating RN: Curtis Sitesorthy, Joanna Primary Care Physician: Daniel NonesKLEIN, BERT Other Clinician: Referring Physician: Daniel NonesKLEIN, BERT Treating Physician/Extender: Rudene ReBritto, Errol Weeks in Treatment: 9 Edema Assessment Assessed: [Left: No] [Right: No] E[Left: dema] [Right: :] Calf Left: Right: Point of Measurement: 39 cm From Medial Instep cm 34.7 cm Ankle Left: Right: Point of Measurement: 12 cm From Medial Instep cm 23.1 cm Vascular Assessment Pulses: Posterior Tibial Dorsalis Pedis Palpable: [Right:Yes] Extremity colors, hair growth, and conditions: Extremity Color: [Right:Hyperpigmented] Hair Growth on Extremity: [Right:No] Temperature of Extremity: [Right:Warm] Capillary Refill: [Right:< 3 seconds] Toe Nail Assessment Left: Right: Thick: Yes Discolored: Yes Deformed: No Improper Length and Hygiene: No Electronic Signature(s) Signed: 09/13/2014 5:16:47 PM By: Curtis Sitesorthy, Joanna Entered By: Curtis Sitesorthy, Joanna on 09/13/2014 13:20:49 Nicholas Leonard, Nicholas FettersFREDDIE E. ([id number]) -------------------------------------------------------------------------------- Multi Wound Chart Details Patient Name: Nicholas Leonard, Nicholas E. Date of Service: 09/13/2014 1:00 PM Medical Record  Number: [id number] Patient Account Number: [id number] Date of Birth/Sex: January 19, 1946 [age] y.o. 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] Comorbid History: [1:Congestive Heart Failure, N/A Coronary Artery Disease, Hypertension, Type II Diabetes, Gout, Neuropathy] Date Acquired: [1:06/10/2014] [N/A:N/A] Weeks  of Treatment: [1:9] [N/A:N/A] Wound Status: [1:Open] [N/A:N/A] Measurements L x W x D 2.4x2.5x0.3 [N/A:N/A] (cm) Area (cm) : [1:4.712] [N/A:N/A] Volume (cm) : [1:1.414] [N/A:N/A] % Reduction in Area: [1:-274.90%] [N/A:N/A] % Reduction in Volume: -1022.20% [N/A:N/A] Classification: [1:Partial Thickness] [N/A:N/A] HBO Classification: [1:Grade 1] [N/A:N/A] Exudate Amount: [1:Large] [N/A:N/A] Exudate Type: [1:Serous] [N/A:N/A] Exudate Color: [1:amber] [N/A:N/A] Wound Margin: [1:Distinct, outline attached N/A] Granulation Amount: [1:Large (67-100%)] [N/A:N/A] Granulation Quality: [1:Red, Hyper-granulation] [N/A:N/A] Necrotic Amount: [1:Small (1-33%)] [N/A:N/A] Exposed Structures: [N/A:N/A] Fascia: No Fat: No Tendon: No Muscle: No Joint: No Bone: No Limited to Skin Breakdown Epithelialization: Small (1-33%) N/A N/A Periwound Skin Texture: Edema: Yes N/A N/A Excoriation: No Induration: No Callus: No Crepitus: No Fluctuance: No Friable: No Rash: No Scarring: No Periwound Skin Moist: Yes N/A  N/A Moisture: Maceration: No Dry/Scaly: No Periwound Skin Color: Atrophie Blanche: No N/A N/A Cyanosis: No Ecchymosis: No Erythema: No Hemosiderin Staining: No Mottled: No Pallor: No Rubor: No Temperature: No Abnormality N/A N/A Tenderness on Yes N/A N/A Palpation: Wound Preparation: Ulcer Cleansing: Other: N/A N/A soap and water Topical Anesthetic Applied: Other: lidocaine 4% Treatment Notes Electronic Signature(s) Signed: 09/13/2014 5:11:17 PM By: Elliot Gurney, RN, BSN, Kim RN, BSN Entered By: Elliot Gurney, RN, BSN, Kim on 09/13/2014 13:33:36 Nicholas Leonard, Nicholas Leonard ([id number]) -------------------------------------------------------------------------------- Multi-Disciplinary Care Plan Details Patient Name: AASHIR, UMHOLTZ. Date of Service: 09/13/2014 1:00 PM Medical Record Number: [id number] Patient Account Number: [id number] Date of Birth/Sex: 1946/01/06 (70 y.o. Male) Treating RN: Huel Coventry Primary Care Physician: Daniel Nones Other Clinician: Referring Physician: Daniel Nones Treating Physician/Extender: Rudene Re in Treatment: 9 Active Inactive Nutrition Nursing Diagnoses: Impaired glucose control: actual or potential Potential for alteratiion in Nutrition/Potential for imbalanced nutrition Goals: Patient/caregiver verbalizes understanding of need to maintain therapeutic glucose control per primary care physician Date Initiated: 07/09/2014 Goal Status: Active Patient/caregiver will maintain therapeutic glucose control Date Initiated: 07/09/2014 Goal Status: Active Interventions: Assess patient nutrition upon admission and as needed per policy Provide education on elevated blood sugars and impact on wound healing Provide education on nutrition Treatment Activities: Education provided on Nutrition : 07/09/2014 Notes: Orientation to the Wound Care Program Nursing Diagnoses: Knowledge deficit related to the wound healing center program Goals: Patient/caregiver will  verbalize understanding of the Wound Healing Center Program Date Initiated: 07/09/2014 Goal Status: Active Interventions: Provide education on orientation to the wound center Nicholas Leonard, LANT. ([id number]) Notes: Venous Leg Ulcer Nursing Diagnoses: Knowledge deficit related to disease process and management Potential for venous Insuffiency (use before diagnosis confirmed) Goals: Patient will maintain optimal edema control Date Initiated: 07/09/2014 Goal Status: Active Verify adequate tissue perfusion prior to therapeutic compression application Date Initiated: 07/09/2014 Goal Status: Active Interventions: Assess peripheral edema status every visit. Compression as ordered Provide education on venous insufficiency Treatment Activities: Test ordered outside of clinic : 09/13/2014 Therapeutic compression applied : 09/13/2014 Notes: Wound/Skin Impairment Nursing Diagnoses: Knowledge deficit related to ulceration/compromised skin integrity Goals: Patient/caregiver will verbalize understanding of skin care regimen Date Initiated: 07/09/2014 Goal Status: Active Ulcer/skin breakdown will heal within 14 weeks Date Initiated: 07/09/2014 Goal Status: Active Interventions: Assess patient/caregiver ability to obtain necessary supplies Assess patient/caregiver ability to perform ulcer/skin care regimen upon admission and as needed Assess ulceration(s) every visit Provide education on ulcer and skin care Nicholas Leonard, Nicholas Leonard ([id number]) Treatment Activities: Patient referred to home care : 09/13/2014 Skin care regimen initiated : 09/13/2014 Topical wound management initiated : 09/13/2014 Notes: Electronic Signature(s) Signed: 09/13/2014 5:11:17 PM By: Elliot Gurney, RN, BSN, Kim RN, BSN Entered By: Elliot Gurney,  RN, BSN, Kim on 09/13/2014 13:33:29 Nicholas Ledger ([id number]) -------------------------------------------------------------------------------- Patient/Caregiver Education Details Patient Name:  Nicholas Leonard, RIPPLE. Date of Service: 09/13/2014 1:00 PM Medical Record Number: [id number] Patient Account Number: [id number] Date of Birth/Gender: 07-14-1945 (69 y.o. Male) Treating RN: Curtis Sites Primary Care Physician: Daniel Nones Other Clinician: Referring Physician: Daniel Nones Treating Physician/Extender: Rudene Re in Treatment: 9 Education Assessment Education Provided To: Patient and Caregiver Education Topics Provided Wound/Skin Impairment: Handouts: Caring for Your Ulcer Methods: Demonstration, Explain/Verbal Responses: State content correctly Electronic Signature(s) Signed: 09/13/2014 5:16:47 PM By: Curtis Sites Entered By: Curtis Sites on 09/13/2014 14:04:17 Nicholas Ledger ([id number]) -------------------------------------------------------------------------------- Wound Assessment Details Patient Name: Nicholas Leonard, DIETRICK. Date of Service: 09/13/2014 1:00 PM Medical Record Number: [id number] Patient Account Number: [id number] Date of Birth/Sex: 07-13-1945 (69 y.o. 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 - Medial Secondary Diabetic Wound/Ulcer of the Lower Wounding Event: Gradually Appeared Etiology: Extremity Date Acquired: 06/10/2014 Wound Open Weeks Of Treatment: 9 Status: Clustered Wound: No Comorbid Congestive Heart Failure, Coronary History: Artery Disease, Hypertension, Type II Diabetes, Gout, Neuropathy Photos Photo Uploaded By: Curtis Sites on 09/13/2014 17:07:39 Wound Measurements Length: (cm) 2.4 Width: (cm) 2.5 Depth: (cm) 0.3 Area: (cm) 4.712 Volume: (cm) 1.414 % Reduction in Area: -274.9% % Reduction in Volume: -1022.2% Epithelialization: Small (1-33%) Tunneling:  No Undermining: No Wound Description Classification: Partial Thickness Diabetic Severity (Wagner): Grade 1 Wound Margin: Distinct, outline attache Exudate Amount: Large Exudate Type: Serous Exudate Color: amber Foul Odor After Cleansing: No d Wound Bed Granulation Amount: Large (67-100%) Exposed Structure Nicholas Leonard, Nicholas E. ([id number]) Granulation Quality: Red, Hyper-granulation Fascia Exposed: No Necrotic Amount: Small (1-33%) Fat Layer Exposed: No Necrotic Quality: Adherent Slough Tendon Exposed: No Muscle Exposed: No Joint Exposed: No Bone Exposed: No Limited to Skin Breakdown Periwound Skin Texture Texture Color No Abnormalities Noted: No No Abnormalities Noted: No Callus: No Atrophie Blanche: No Crepitus: No Cyanosis: No Excoriation: No Ecchymosis: No Fluctuance: No Erythema: No Friable: No Hemosiderin Staining: No Induration: No Mottled: No Localized Edema: Yes Pallor: No Rash: No Rubor: No Scarring: No Temperature / Pain Moisture Temperature: No Abnormality No Abnormalities Noted: No Tenderness on Palpation: Yes Dry / Scaly: No Maceration: No Moist: Yes Wound Preparation Ulcer Cleansing: Other: soap and water, Topical Anesthetic Applied: Other: lidocaine 4%, Treatment Notes Wound #1 (Right, Medial Lower Leg) 1. Cleansed with: Cleanse wound with antibacterial soap and water 2. Anesthetic Topical Lidocaine 4% cream to wound bed prior to debridement 4. Dressing Applied: Prisma Ag 5. Secondary Dressing Applied ABD Pad 7. Secured with 2 Layer Lite Compression System - Right Lower Extremity Notes contact layer to posterior distal lower leg Electronic Signature(s) Nicholas Leonard, Nicholas Leonard ([id number]) Signed: 09/13/2014 5:16:47 PM By: Curtis Sites Entered By: Curtis Sites on 09/13/2014 13:27:53 Nicholas Leonard, Nicholas Leonard ([id number]) -------------------------------------------------------------------------------- Vitals Details Patient Name: Nicholas Ledger. Date of Service: 09/13/2014 1:00 PM Medical Record Number: [id number] Patient Account Number: [id number] Date of Birth/Sex: 1946-01-24 (69 y.o. Male) Treating RN: Curtis Sites Primary Care Physician: Daniel Nones Other Clinician: Referring Physician: Daniel Nones Treating Physician/Extender: Rudene Re in Treatment: 9 Vital Signs Time Taken: 13:12 Temperature (F): 97.9 Height (in): 72 Pulse (bpm): 66 Weight (lbs): 215 Respiratory Rate (breaths/min): 18 Body Mass Index (BMI): 29.2 Blood Pressure (mmHg): 103/58 Reference Range: 80 - 120 mg /  dl Electronic Signature(s) Signed: 09/13/2014 5:16:47 PM By: Curtis Sites Entered By: Curtis Sites on 09/13/2014 13:14:56

## 2014-09-14 NOTE — Progress Notes (Signed)
Nicholas Leonard ([id number]) Visit Report for 09/13/2014 Chief Complaint Document Details Patient Name: Nicholas, Leonard. Date of Service: 09/13/2014 1:00 PM Medical Record Number: [id number] Patient Account Number: [id number] Date of Birth/Sex: Aug 31, 1945 (69 y.o. Male) Treating RN: Primary Care Physician: Daniel Nones Other Clinician: Referring Physician: Daniel Nones Treating Physician/Extender: Rudene Re in Treatment: 9 Information Obtained from: Patient Chief Complaint Patient presents to the wound care center for a consult due non healing wound ulcer on the right lower limb which was noted to be there for about 3 weeks now. he is also had swelling of both lower extremities for about 2 months now. Electronic Signature(s) Signed: 09/13/2014 4:59:10 PM By: Evlyn Kanner MD, FACS Entered By: Evlyn Kanner on 09/13/2014 13:43:21 Nicholas Leonard ([id number]) -------------------------------------------------------------------------------- Debridement Details Patient Name: Nicholas, [name]: 09/13/2014 1:00 PM Medical Record Number: [id number] Patient Account Number: [id number] Date of Birth/Sex: November 03, 1945 (69 y.o. Male) Treating RN: Primary Care Physician: Daniel Nones Other Clinician: Referring Physician: Daniel Nones Treating Physician/Extender: Rudene Re in Treatment: 9 Debridement Performed for Wound #1 Right,Medial Lower Leg Assessment: Performed By: Physician Tristan Schroeder., MD Debridement: Debridement Pre-procedure Yes Verification/Time Out Taken: Start Time: 13:32 Pain Control: Other : idocaine 4% Level: Skin/Subcutaneous Tissue Total Area Debrided (L x 2.4 (cm) x 2.5 (cm) = 6 (cm) W): Tissue and other Non-Viable, Eschar, Fibrin/Slough, Subcutaneous material debrided: Instrument: Forceps Bleeding: Minimum Hemostasis Achieved: Pressure End Time: 13:37 Procedural Pain: 0 Post Procedural Pain: 0 Response to Treatment:  Procedure was tolerated well Post Debridement Measurements of Total Wound Length: (cm) 2.4 Width: (cm) 2.5 Depth: (cm) 0.3 Volume: (cm) 1.414 Electronic Signature(s) Signed: 09/13/2014 4:59:10 PM By: Evlyn Kanner MD, FACS Entered By: Evlyn Kanner on 09/13/2014 13:43:13 Nicholas Leonard ([id number]) -------------------------------------------------------------------------------- HPI Details Patient Name: Nicholas Leonard. Date of Service: 09/13/2014 1:00 PM Medical Record Number: [id number] Patient Account Number: [id number] Date of Birth/Sex: 02-Jan-1946 (69 y.o. Male) Treating RN: Primary Care Physician: Daniel Nones Other Clinician: Referring Physician: Daniel Nones Treating Physician/Extender: Rudene Re in Treatment: 9 History of Present Illness HPI Description: 69 year old gentleman who is recently discharged from [facility] and was admitted between 3 18-3/22 by his primary care physician Dr. Daniel Nones. He has adult onset diabetes and was recently admitted for acute on chronic left-sided systolic congestive heart failure, acute on chronic renal failure, coronary artery disease, ischemic cardiomyopathy, ventricular fibrillation status post AICD implantation, history of atrial fibrillation on Coumadin therapy, hypothyroidism, history of venous stasis ulcer, pulmonary embolism with deep vein thrombosis on chronic Coumadin therapy, osteoarthritis and gout. He does not smoke any longer and occasionally has alcohol. as not been compliant with getting his blood sugars checked and does not know the last time he had a hemoglobin A1c done. Does not recall when the last ultrasound of his lower extremities was done but he does say that he has had clots in his legs. 07/30/2014 the patient is back to [place] after a recent admission and discharge from the hospital and he was there between 07/19/2014 and 07/26/2014. Reviewed these notes and his final diagnosis was #1  elevated INR, #2 acute on chronic left-sided systolic congestive heart failure, #3 hypothyroidism, #4 gout, #5 chronic kidney disease stage III, #6 acute renal failure, #7 carotid artery disease, #8 history of pulmonary embolism and DVT, #9 adult onset diabetes mellitus. During the admission he was corrected with giving vitamin K and due to his difficulty with Coumadin he was changed over to  Eliquis. The patient also was found to have diffuse edema and there was evidence of ascites on abdominal ultrasound and was placed on a Lasix drip by nephrologist. His just discharge medications were noted by me. they have been using Santyl on his right lower extremity wound and he has been doing this dressing as often as the nurse comes in. he has also developed a ulcer of the left thigh and this has been there for about 2 weeks and has some necrosis of skin over there. 08/23/2014 -- he recently saw the vascular surgeon Dr. Gilda Crease and has had both an arterial and duplex venous study done. Though we do not have the official report yet the patient tells me that his arteries were okay and there was no problem with the blood flow. 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 08/30/2014 -- his vascular surgery has been scheduled for Tuesday, May 24 and he will come back and see [place] if he needs to soon after that for a rewrap. Addendum: We have finally received his transcript of his office visit with Dr. Gilda Crease on 08/22/2014 and note that the duplex US of the lower extremity arterial system bilaterally SFA are patent, with diffuse tibial disease with monophasic tibial signals on the right and biphasic signals on the left. Duplex Venous ultrasound of the lower extremity shows normal deep venous system, superficial reflux is present over a short segment of the GI suite in the region of the ulcer. However the GSC from the knees  to the groin is competent. The recommendations were for the patient to have angiography of the lower extremity with the hope of Nicholas Leonard, Nicholas Leonard. ([id number]) intervention for limb salvage. Once the arterial compromise has been addressed then the venous portion can be more effectively treated. 09/05/2014 - s/p RLE angiogram without intervention 09/03/2014 by Dr Gilda Crease. No new complaints today. No significant pain. No fever or chills. Minimal drainage. 09/13/2014 -- On 09/03/2014 o Procedure(s) Performed: 1. Abdominal aortogram 2. Right lower extremity distal runoff third order catheter placement 3. Additional right third order catheter placement as per the patient the vascular surgeon will see him back in 6 months and no other procedures planned for in the near future. Electronic Signature(s) Signed: 09/13/2014 4:59:10 PM By: Evlyn Kanner MD, FACS Entered By: Evlyn Kanner on 09/13/2014 13:43:47 Nicholas Leonard ([id number]) -------------------------------------------------------------------------------- Physical Exam Details Patient Name: Nicholas, Leonard. Date of Service: 09/13/2014 1:00 PM Medical Record Number: [id number] Patient Account Number: [id number] Date of Birth/Sex: [birth year] (69 y.o. Male) Treating RN: Primary Care Physician: Daniel Nones Other Clinician: Referring Physician: Daniel Nones Treating Physician/Extender: Rudene Re in Treatment: 9 Constitutional . Pulse regular. Respirations normal and unlabored. Afebrile. . Eyes Nonicteric. Reactive to light. Ears, Nose, Mouth, and Throat Lips, teeth, and gums WNL.Marland Kitchen Moist mucosa without lesions . Neck supple and nontender. No palpable supraclavicular or cervical adenopathy. Normal sized without goiter. Respiratory WNL. No retractions.. Cardiovascular Pedal Pulses WNL. No clubbing, cyanosis or edema. Integumentary (Hair, Skin) the ulcer on the left lower extremity is fairly clean and there is minimal debris  which was sharply debrided.. No crepitus or fluctuance. No peri-wound warmth or erythema. No masses.Marland Kitchen Psychiatric Judgement and insight Intact.. No evidence of depression, anxiety, or agitation.. Notes On the posterior lower extremity on the right side where the wrap was placed there was a small fluid-filled blister which opened out as the wrap was removed.  The dermis under this is normal. Electronic Signature(s) Signed: 09/13/2014 4:59:10 PM By: Evlyn Kanner MD, FACS Entered By: Evlyn Kanner on 09/13/2014 13:47:28 Nicholas Leonard ([id number]) -------------------------------------------------------------------------------- Physician Orders Details Patient Name: Nicholas Leonard Date of Service: 09/13/2014 1:00 PM Medical Record Number: [id number] Patient Account Number: [id number] Date of Birth/Sex: [birth year] (69 y.o. Male) Treating RN: Huel Coventry Primary Care Physician: Daniel Nones Other Clinician: Referring Physician: Daniel Nones Treating Physician/Extender: Rudene Re in Treatment: 9 Verbal / Phone Orders: Yes Clinician: Huel Coventry Read Back and Verified: Yes Diagnosis Coding Wound Cleansing Wound #1 Right,Medial Lower Leg o May shower with protection. o No tub bath. Anesthetic Wound #1 Right,Medial Lower Leg o Topical Lidocaine 4% cream applied to wound bed prior to debridement Skin Barriers/Peri-Wound Care Wound #1 Right,Medial Lower Leg o Barrier cream Primary Wound Dressing Wound #1 Right,Medial Lower Leg o Prisma Ag Secondary Dressing Wound #1 Right,Medial Lower Leg o ABD and Kerlix/Conform Dressing Change Frequency Wound #1 Right,Medial Lower Leg o Change dressing every week Follow-up Appointments Wound #1 Right,Medial Lower Leg o Return Appointment in 1 week. Edema Control Wound #1 Right,Medial Lower Leg o 2 Layer Lite Compression System - Right Lower Extremity Home Health TRIGGER, FRASIER ([id number]) Wound #1 Right,Medial  Lower Leg o Continue Home Health Visits o Home Health Nurse may visit PRN to address patientos wound care needs. o FACE TO FACE ENCOUNTER: MEDICARE and MEDICAID PATIENTS: I certify that this patient is under my care and that I had a face-to-face encounter that meets the physician face-to-face encounter requirements with this patient on this date. The encounter with the patient was in whole or in part for the following MEDICAL CONDITION: (primary reason for Home Healthcare) MEDICAL NECESSITY: I certify, that based on my findings, NURSING services are a medically necessary home health service. HOME BOUND STATUS: I certify that my clinical findings support that this patient is homebound (i.e., Due to illness or injury, pt requires aid of supportive devices such as crutches, cane, wheelchairs, walkers, the use of special transportation or the assistance of another person to leave their place of residence. There is a normal inability to leave the home and doing so requires considerable and taxing effort. Other absences are for medical reasons / religious services and are infrequent or of short duration when for other reasons). o If current dressing causes regression in wound condition, may D/C ordered dressing product/s and apply Normal Saline Moist Dressing daily until next Wound Healing Center / Other MD appointment. Notify Wound Healing Center of regression in wound condition at [phone]. o Please direct any NON-WOUND related issues/requests for orders to patient's Primary Care Physician Electronic Signature(s) Signed: 09/13/2014 4:59:10 PM By: Evlyn Kanner MD, FACS Signed: 09/13/2014 5:11:17 PM By: Elliot Gurney RN, BSN, Kim RN, BSN Entered By: Elliot Gurney, RN, BSN, Kim on 09/13/2014 13:40:56 Pellot, [name] ([id number]) -------------------------------------------------------------------------------- Problem List Details Patient Name: Nicholas, Leonard. Date of Service: 09/13/2014 1:00  PM Medical Record Number: [id number] Patient Account Number: [id number] Date of Birth/Sex: 02-24-1946 (69 y.o. 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 Date of Service: 09/13/2014 1:00 PM Medical Record Number: [id number] Patient Account Number: [id number] Date of Birth/Sex: Nov 28, 1945 (69 y.o. Male) Treating RN: Primary Care Physician: Daniel Nones Other Clinician: Referring Physician: Daniel Nones Treating Physician/Extender: Rudene Re in Treatment: 9 Subjective Chief Complaint Information obtained from Patient Patient presents to the wound care center for a consult due non healing wound ulcer on the right lower limb which was noted to be there for about 3 weeks now. he is also had swelling of both lower extremities for about 2  months now. History of Present Illness (HPI) 69 year old gentleman who is recently discharged from [facility] and was admitted between 3 18-3/22 by his primary care physician Dr. Daniel Nones. He has adult onset diabetes and was recently admitted for acute on chronic left-sided systolic congestive heart failure, acute on chronic renal failure, coronary artery disease, ischemic cardiomyopathy, ventricular fibrillation status post AICD implantation, history of atrial fibrillation on Coumadin therapy, hypothyroidism, history of venous stasis ulcer, pulmonary embolism with deep vein thrombosis on chronic Coumadin therapy, osteoarthritis and gout. He does not smoke any longer and occasionally has alcohol. as not been compliant with getting his blood sugars checked and does not know the last time he had a hemoglobin A1c done. Does not recall when the last ultrasound of his lower extremities was done but he does say that he has had clots in his legs. 07/30/2014 the patient is back to [place] after a recent admission and discharge from the hospital and he was there between 07/19/2014 and 07/26/2014. Reviewed these notes and his final diagnosis was #1 elevated INR, #2 acute on chronic left-sided systolic congestive heart failure, #3 hypothyroidism, #4 gout, #5 chronic kidney disease stage III, #6 acute renal failure, #7 carotid artery disease, #8 history of pulmonary embolism and DVT, #9 adult onset diabetes mellitus. During the admission he was corrected with giving vitamin K and due to his difficulty with Coumadin he was changed over to Eliquis. The patient also was found to have diffuse edema and there was evidence of ascites on abdominal ultrasound and was placed on a Lasix drip by nephrologist. His just discharge medications were noted by me. they have been using Santyl on his right lower extremity wound and he has been doing this dressing as often as the nurse comes in. he has also  developed a ulcer of the left thigh and this has been there for about 2 weeks and has some necrosis of skin over there. 08/23/2014 -- he recently saw the vascular surgeon Dr. Gilda Crease and has had both an arterial and duplex venous study done. Though we do not have the official report yet the patient tells me that his arteries were okay and there was no problem with the blood flow. 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 08/30/2014 -- his vascular surgery has been scheduled for Tuesday, May 24 and he will come back and see Nicholas, Leonard ([id number]) [place] if he needs to soon  after that for a rewrap. Addendum: We have finally received his transcript of his office visit with Dr. Gilda Crease on 08/22/2014 and note that the duplex US of the lower extremity arterial system bilaterally SFA are patent, with diffuse tibial disease with monophasic tibial signals on the right and biphasic signals on the left. Duplex Venous ultrasound of the lower extremity shows normal deep venous system, superficial reflux is present over a short segment of the GI suite in the region of the ulcer. However the GSC from the knees to the groin is competent. The recommendations were for the patient to have angiography of the lower extremity with the hope of intervention for limb salvage. Once the arterial compromise has been addressed then the venous portion can be more effectively treated. 09/05/2014 - s/p RLE angiogram without intervention 09/03/2014 by Dr Gilda Crease. No new complaints today. No significant pain. No fever or chills. Minimal drainage. 09/13/2014 -- On 09/03/2014 Procedure(s) Performed: 1. Abdominal aortogram 2. Right lower extremity distal runoff third order catheter placement 3. Additional right third order catheter placement as per the patient the vascular surgeon will see him back in 6 months and no other procedures planned  for in the near future. Objective Constitutional Pulse regular. Respirations normal and unlabored. Afebrile. Vitals Time Taken: 1:12 PM, Height: 72 in, Weight: 215 lbs, BMI: 29.2, Temperature: 97.9 F, Pulse: 66 bpm, Respiratory Rate: 18 breaths/min, Blood Pressure: 103/58 mmHg. Eyes Nonicteric. Reactive to light. Ears, Nose, Mouth, and Throat Lips, teeth, and gums WNL.Marland Kitchen Moist mucosa without lesions . Neck supple and nontender. No palpable supraclavicular or cervical adenopathy. Normal sized without goiter. Respiratory WNL. No retractions.. Cardiovascular Pedal Pulses WNL. No clubbing, cyanosis or edema. Psychiatric Judgement and insight Intact.. No evidence of depression, anxiety, or agitation.Marland Kitchen Nicholas, Leonard ([id number]) General Notes: On the posterior lower extremity on the right side where the wrap was placed there was a small fluid-filled blister which opened out as the wrap was removed. The dermis under this is normal. Integumentary (Hair, Skin) the ulcer on the left lower extremity is fairly clean and there is minimal debris which was sharply debrided.. No crepitus or fluctuance. No peri-wound warmth or erythema. No masses.. Wound #1 status is Open. Original cause of wound was Gradually Appeared. The wound is located on the Right,Medial Lower Leg. The wound measures 2.4cm length x 2.5cm width x 0.3cm depth; 4.712cm^2 area and 1.414cm^3 volume. The wound is limited to skin breakdown. There is no tunneling or undermining noted. There is a large amount of serous drainage noted. The wound margin is distinct with the outline attached to the wound base. There is large (67-100%) red granulation within the wound bed. There is a small (1-33%) amount of necrotic tissue within the wound bed including Adherent Slough. The periwound skin appearance exhibited: Localized Edema, Moist. The periwound skin appearance did not exhibit: Callus, Crepitus, Excoriation, Fluctuance, Friable,  Induration, Rash, Scarring, Dry/Scaly, Maceration, Atrophie Blanche, Cyanosis, Ecchymosis, Hemosiderin Staining, Mottled, Pallor, Rubor, Erythema. Periwound temperature was noted as No Abnormality. The periwound has tenderness on palpation. 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 we will change over to Prisma AG over the ulcer bed and apply a light 2 layer compression wrap. As discussed with the patient and his wife, plans for the future may include a skin substitute and once the ulcer heals he will need long-term compression wraps of the 20-30 mmHg and we will prescribe these for him. He will come and see me back next week. Nicholas Leonard, Nicholas E. ([id number]) Procedures Wound #1 Wound #1 is an Arterial Insufficiency Ulcer located on the Right,Medial Lower Leg . There was a Skin/Subcutaneous Tissue Debridement ([id number]) debridement with total area of 6 sq cm performed by Tajai Ihde, Ignacia FellingErrol J., MD. with the following instrument(s): Forceps to remove Non-Viable tissue/material including Fibrin/Slough, Eschar, and Subcutaneous after achieving pain control using Other (idocaine 4%). A time out was conducted prior to the start of the procedure. A Minimum amount of bleeding was controlled with Pressure. The procedure was tolerated well with a pain level of 0  throughout and a pain level of 0 following the procedure. Post Debridement Measurements: 2.4cm length x 2.5cm width x 0.3cm depth; 1.414cm^3 volume. Plan Wound Cleansing: Wound #1 Right,Medial Lower Leg: May shower with protection. No tub bath. Anesthetic: Wound #1 Right,Medial Lower Leg: Topical Lidocaine 4% cream applied to wound bed prior to debridement Skin Barriers/Peri-Wound Care: Wound #1 Right,Medial Lower Leg: Barrier cream Primary Wound Dressing: Wound #1 Right,Medial Lower Leg: Prisma Ag Secondary Dressing: Wound #1 Right,Medial Lower Leg: ABD and Kerlix/Conform Dressing Change Frequency: Wound #1 Right,Medial Lower Leg: Change dressing every week Follow-up Appointments: Wound #1 Right,Medial Lower Leg: Return Appointment in 1 week. Edema Control: Wound #1 Right,Medial Lower Leg: 2 Layer Lite Compression System - Right Lower Extremity Home Health: Wound #1 Right,Medial Lower Leg: Continue Home Health Visits Home Health Nurse may visit PRN to address patient s wound care needs. FACE TO FACE ENCOUNTER: MEDICARE and MEDICAID PATIENTS: I certify that this patient is under my care and that I had a face-to-face encounter that meets the physician face-to-face encounter Nicholas Leonard, Nicholas E. ([id number]) requirements with this patient on this date. The encounter with the patient was in whole or in part for the following MEDICAL CONDITION: (primary reason for Home Healthcare) MEDICAL NECESSITY: I certify, that based on my findings, NURSING services are a medically necessary home health service. HOME BOUND STATUS: I certify that my clinical findings support that this patient is homebound (i.e., Due to illness or injury, pt requires aid of supportive devices such as crutches, cane, wheelchairs, walkers, the use of special transportation or the assistance of another person to leave their place of residence. There is a normal inability to leave the home and doing so requires  considerable and taxing effort. Other absences are for medical reasons / religious services and are infrequent or of short duration when for other reasons). If current dressing causes regression in wound condition, may D/C ordered dressing product/s and apply Normal Saline Moist Dressing daily until next Wound Healing Center / Other MD appointment. Notify Wound Healing Center of regression in wound condition at [phone]. Please direct any NON-WOUND related issues/requests for orders to patient's Primary Care Physician Since the arterial intervention was a diagnostic rather than therapeutic, at this stage I believe we will continue treating him as a venous disease. we will change over to Prisma AG over the ulcer bed and apply a  light 2 layer compression wrap. As discussed with the patient and his wife, plans for the future may include a skin substitute and once the ulcer heals he will need long-term compression wraps of the 20-30 mmHg and we will prescribe these for him. He will come and see me back next week. Electronic Signature(s) Signed: 09/13/2014 4:59:10 PM By: Evlyn Kanner MD, FACS Entered By: Evlyn Kanner on 09/13/2014 13:51:40 Nicholas Leonard ([id number]) -------------------------------------------------------------------------------- SuperBill Details Patient Name: Nicholas, Leonard. Date of Service: 09/13/2014 Medical Record Number: [id number] Patient Account Number: [id number] Date of Birth/Sex: [birth year] (69 y.o. 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 Atherosclerosis of native arteries of right leg with ulceration of calf Facility Procedures CPT4 Code Description: 78295621 11042 - DEB SUBQ TISSUE 20 SQ CM/< ICD-10 Description Diagnosis E11.622 Type 2 diabetes mellitus with other skin ulcer I87.011 Postthrombotic syndrome with ulcer of right lower ext I70.232 Atherosclerosis of native  arteries of right leg with Modifier: remity ulceration of c Quantity: 1 alf Physician Procedures CPT4 Code Description: 3086578 99213 - WC PHYS LEVEL 3 - EST PT ICD-10 Description Diagnosis E11.622 Type 2 diabetes mellitus with other skin ulcer I70.232 Atherosclerosis of native arteries of right leg with ul I87.311 Chronic venous hypertension  (idiopathic) with ulcer of I87.011 Postthrombotic syndrome with ulcer of right lower extre Modifier: ceration of cal right lower ext mity Quantity: 1 f remity CPT4 Code Description: 4696295 11042 - WC PHYS SUBQ TISS 20 SQ CM ICD-10 Description Diagnosis E11.622 Type 2 diabetes mellitus with other skin ulcer I87.011 Postthrombotic syndrome with ulcer of right lower extre Nicholas, Leonard ([id number]) Modifier: mity Quantity: 1 Electronic Signature(s) Signed: 09/13/2014 4:59:10 PM By: Evlyn Kanner MD, FACS Entered By: Evlyn Kanner on 09/13/2014 13:52:59

## 2014-09-20 ENCOUNTER — Encounter: Payer: Medicare Other | Admitting: Surgery

## 2014-09-20 DIAGNOSIS — L97919 Non-pressure chronic ulcer of unspecified part of right lower leg with unspecified severity: Secondary | ICD-10-CM | POA: Diagnosis not present

## 2014-09-21 NOTE — Progress Notes (Signed)
Nicholas, Leonard ([id number]) Visit Report for 09/20/2014 Arrival Information Details Patient Name: Nicholas Leonard, Nicholas Leonard. Date of Service: 09/20/2014 1:00 PM Medical Record Number: [id number] Patient Account Number: [id number] Date of Birth/Sex: 1945/09/16 (69 y.o. Male) Treating RN: Clover Mealy, RN, BSN, [place] Sink Primary Care Physician: Daniel Nones Other Clinician: Referring Physician: Daniel Nones Treating Physician/Extender: Rudene Re in Treatment: 10 Visit Information History Since Last Visit Any new allergies or adverse reactions: No Patient Arrived: Ambulatory Had a fall or experienced change in No Arrival Time: 13:06 activities of daily living that may affect Accompanied By: self risk of falls: Transfer Assistance: None Signs or symptoms of abuse/neglect since last No Patient Identification Verified: Yes visito Secondary Verification Process Yes Hospitalized since last visit: No Completed: Has Dressing in Place as Prescribed: Yes Patient Requires Transmission- No Has Compression in Place as Prescribed: Yes Based Precautions: Pain Present Now: No Patient Has Alerts: Yes Patient Alerts: Patient on Blood Thinner coumadin DMII ABI L 1.75 R 1.55 Electronic Signature(s) Signed: 09/20/2014 1:37:20 PM By: Elpidio Eric BSN, RN Entered By: Elpidio Eric on 09/20/2014 13:06:52 Nicholas Leonard ([id number]) -------------------------------------------------------------------------------- Encounter Discharge Information Details Patient Name: Nicholas Leonard. Date of Service: 09/20/2014 1:00 PM Medical Record Number: [id number] Patient Account Number: [id number] Date of Birth/Sex: Nov 07, 1945 (69 y.o. Male) Treating RN: Primary Care Physician: Daniel Nones Other Clinician: Referring Physician: Daniel Nones Treating Physician/Extender: Rudene Re in Treatment: 10 Encounter Discharge Information Items Discharge Pain Level: 0 Discharge Condition: Stable Ambulatory Status:  Ambulatory Discharge Destination: Home Transportation: Private Auto Accompanied By: self Schedule Follow-up Appointment: Yes Medication Reconciliation completed and provided to Patient/Care Yes Ramata Strothman: Provided on Clinical Summary of Care: 09/20/2014 Form Type Recipient Paper Patient FP Electronic Signature(s) Signed: 09/20/2014 4:53:24 PM By: Elliot Gurney RN, BSN, Kim RN, BSN Previous Signature: 09/20/2014 1:26:57 PM Version By: Gwenlyn Perking Entered By: Elliot Gurney RN, BSN, Kim on 09/20/2014 15:03:34 Nicholas Leonard ([id number]) -------------------------------------------------------------------------------- Lower Extremity Assessment Details Patient Name: Nicholas, Leonard. Date of Service: 09/20/2014 1:00 PM Medical Record Number: [id number] Patient Account Number: [id number] Date of Birth/Sex: Sep 02, 1945 (69 y.o. Male) Treating RN: Clover Mealy, RN, BSN, [place] Sink Primary Care Physician: Daniel Nones Other Clinician: Referring Physician: Daniel Nones Treating Physician/Extender: Rudene Re in Treatment: 10 Edema Assessment Assessed: [Left: No] [Right: No] E[Left: dema] [Right: :] Calf Left: Right: Point of Measurement: 39 cm From Medial Instep cm 34.5 cm Ankle Left: Right: Point of Measurement: 12 cm From Medial Instep cm 23.4 cm Vascular Assessment Claudication: Claudication Assessment [Right:None] Pulses: Posterior Tibial Dorsalis Pedis Palpable: [Right:Yes] Extremity colors, hair growth, and conditions: Extremity Color: [Right:Mottled] Hair Growth on Extremity: [Right:No] Temperature of Extremity: [Right:Warm] Capillary Refill: [Right:< 3 seconds] Dependent Rubor: [Right:No] Blanched when Elevated: [Right:No] Lipodermatosclerosis: [Right:No] Toe Nail Assessment Left: Right: Thick: Yes Discolored: Yes Deformed: Yes Improper Length and Hygiene: Yes [name], Leonard ([id number]) Electronic Signature(s) Signed: 09/20/2014 1:37:20 PM By: Elpidio Eric BSN, RN Entered  By: Elpidio Eric on 09/20/2014 13:11:16 Nicholas Leonard ([id number]) -------------------------------------------------------------------------------- Multi Wound Chart Details Patient Name: Nicholas Leonard. Date of Service: 09/20/2014 1:00 PM Medical Record Number: [id number] Patient Account Number: [id number] Date of Birth/Sex: June 25, 1945 (69 y.o. 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] Secondary Etiology: [1:Diabetic Wound/Ulcer of N/A the Lower Extremity] Comorbid History: [1:Congestive Heart Failure, N/A Coronary Artery Disease, Hypertension, Type II Diabetes, Gout, Neuropathy] Date Acquired: [1:06/10/2014] [N/A:N/A] Weeks of Treatment: [1:10] [N/A:N/A] Wound Status: [1:Open] [N/A:N/A] Measurements L x W x D 1.8x2.3x0.3 [N/A:N/A] (cm) Area (cm) : [1:3.252] [N/A:N/A] Volume (cm) : [1:0.975] [N/A:N/A] % Reduction in Area: [1:-158.70%] [N/A:N/A] % Reduction in Volume: -673.80% [N/A:N/A] Classification: [1:Partial Thickness] [N/A:N/A] HBO Classification: [1:Grade 1] [N/A:N/A] Exudate Amount: [1:Medium] [N/A:N/A] Exudate Type: [1:Serous] [N/A:N/A] Exudate Color: [1:amber] [N/A:N/A] Wound Margin: [1:Distinct, outline attached N/A] Granulation Amount: [1:Large (67-100%)] [N/A:N/A] Granulation Quality: [1:Red, Hyper-granulation] [N/A:N/A] Necrotic Amount: [1:None Present (0%)] [N/A:N/A] Exposed Structures: [N/A:N/A] Fascia: No Fat: No Tendon: No Muscle: No Joint: No Bone:  No Limited to Skin Breakdown Epithelialization: Small (1-33%) N/A N/A Periwound Skin Texture: Edema: Yes N/A N/A Excoriation: No Induration: No Callus: No Crepitus: No Fluctuance: No Friable: No Rash: No Scarring: No Periwound Skin Moist: Yes N/A N/A Moisture: Maceration: No Dry/Scaly: No Periwound Skin Color: Atrophie Blanche: No N/A N/A Cyanosis: No Ecchymosis: No Erythema: No Hemosiderin Staining: No Mottled: No Pallor: No Rubor: No Temperature: No Abnormality N/A N/A Tenderness on Yes N/A N/A Palpation: Wound Preparation: Ulcer Cleansing: Other: N/A N/A soap and water Topical Anesthetic Applied: Other: lidocaine 4% Treatment Notes Electronic Signature(s) Signed: 09/20/2014 4:53:24 PM By: Elliot Gurney, RN, BSN, Kim RN, BSN Entered By: Elliot Gurney, RN, BSN, Kim on 09/20/2014 13:18:23 Nicholas Leonard ([id number]) -------------------------------------------------------------------------------- Multi-Disciplinary Care Plan Details Patient Name: VIRL, COBLE. Date of Service: 09/20/2014 1:00 PM Medical Record Number: [id number] Patient Account Number: [id number] Date of Birth/Sex: 1945-09-20 (69 y.o. Male) Treating RN: Huel Coventry Primary Care Physician: Daniel Nones Other Clinician: Referring Physician: Daniel Nones Treating Physician/Extender: Rudene Re in Treatment: 10 Active Inactive Nutrition Nursing Diagnoses: Impaired glucose control: actual or potential Potential for alteratiion in Nutrition/Potential for imbalanced nutrition Goals: Patient/caregiver verbalizes understanding of need to maintain therapeutic glucose control per primary care physician Date Initiated: 07/09/2014 Goal Status: Active Patient/caregiver will maintain therapeutic glucose control Date Initiated: 07/09/2014 Goal Status: Active Interventions: Assess patient nutrition upon admission and as needed per policy Provide education on elevated blood sugars and impact on wound  healing Provide education on nutrition Treatment Activities: Education provided on Nutrition : 07/09/2014 Notes: Orientation to the Wound Care Program Nursing Diagnoses: Knowledge deficit related to the wound healing center program Goals: Patient/caregiver will verbalize understanding of the Wound Healing Center Program Date Initiated: 07/09/2014 Goal Status: Active Interventions: Provide education on orientation to the wound center JOHNIE, MAKKI. ([id number]) Notes: Venous Leg Ulcer Nursing Diagnoses: Knowledge deficit related to disease process and management Potential for venous Insuffiency (use before diagnosis confirmed) Goals: Patient will maintain optimal edema control Date Initiated: 07/09/2014 Goal Status: Active Verify adequate tissue perfusion prior to therapeutic compression application Date Initiated: 07/09/2014 Goal Status: Active Interventions: Assess peripheral edema status every visit. Compression as ordered Provide education on venous insufficiency Treatment Activities: Test ordered outside of clinic : 09/20/2014 Therapeutic compression applied : 09/20/2014 Notes: Wound/Skin Impairment Nursing Diagnoses: Knowledge deficit related to ulceration/compromised skin integrity Goals: Patient/caregiver will verbalize understanding of skin care regimen Date Initiated: 07/09/2014 Goal Status: Active Ulcer/skin breakdown will heal within 14 weeks Date Initiated: 07/09/2014 Goal Status: Active Interventions: Assess patient/caregiver ability to obtain necessary supplies Assess patient/caregiver ability to perform ulcer/skin care regimen upon admission and as needed Assess ulceration(s) every visit Provide education on ulcer and skin care Scalf, Keylan  EMarland Kitchen ([id number]) Treatment Activities: Patient referred to home care : 09/20/2014 Skin care regimen initiated : 09/20/2014 Topical wound management initiated : 09/20/2014 Notes: Electronic Signature(s) Signed:  09/20/2014 4:53:24 PM By: Elliot Gurney, RN, BSN, Kim RN, BSN Entered By: Elliot Gurney, RN, BSN, Kim on 09/20/2014 13:18:16 Nicholas Leonard ([id number]) -------------------------------------------------------------------------------- Pain Assessment Details Patient Name: JAKENDRICK, ANTAL. Date of Service: 09/20/2014 1:00 PM Medical Record Number: [id number] Patient Account Number: [id number] Date of Birth/Sex: [birth year] (69 y.o. Male) Treating RN: Clover Mealy, RN, BSN, [place] Sink Primary Care Physician: Daniel Nones Other Clinician: Referring Physician: Daniel Nones Treating Physician/Extender: Rudene Re in Treatment: 10 Active Problems Location of Pain Severity and Description of Pain Patient Has Paino No Site Locations Pain Management and Medication Current Pain Management: Electronic Signature(s) Signed: 09/20/2014 1:37:20 PM By: Elpidio Eric BSN, RN Entered By: Elpidio Eric on 09/20/2014 13:07:46 Nicholas Leonard ([id number]) -------------------------------------------------------------------------------- Patient/Caregiver Education Details Patient Name: SMARAN, BEE. Date of Service: 09/20/2014 1:00 PM Medical Record Number: [id number] Patient Account Number: [id number] Date of Birth/Gender: 07/16/1945 (69 y.o. Male) Treating RN: Huel Coventry Primary Care Physician: Daniel Nones Other Clinician: Referring Physician: Daniel Nones Treating Physician/Extender: Rudene Re in Treatment: 10 Education Assessment Education Provided To: Patient Education Topics Provided Wound/Skin Impairment: Handouts: Other: continue wound care as ordered Electronic Signature(s) Signed: 09/20/2014 4:53:24 PM By: Elliot Gurney, RN, BSN, Kim RN, BSN Entered By: Elliot Gurney, RN, BSN, Kim on 09/20/2014 15:04:14 Nicholas Leonard ([id number]) -------------------------------------------------------------------------------- Wound Assessment Details Patient Name: JAMERE, OSHER. Date of Service: 09/20/2014 1:00  PM Medical Record Number: [id number] Patient Account Number: [id number] Date of Birth/Sex: [birth year] (69 y.o. Male) Treating RN: Clover Mealy, RN, BSN, [place] Primary Care Physician: Daniel Nones Other Clinician: Referring Physician: Daniel Nones Treating Physician/Extender: Rudene Re in Treatment: 10 Wound Status Wound Number: 1 Primary Arterial Insufficiency Ulcer Etiology: Wound Location: Right Lower Leg - Medial Secondary Diabetic Wound/Ulcer of the Lower Wounding Event: Gradually Appeared Etiology: Extremity Date Acquired: 06/10/2014 Wound Open Weeks Of Treatment: 10 Status: Clustered Wound: No Comorbid Congestive Heart Failure, Coronary History: Artery Disease, Hypertension, Type II Diabetes, Gout, Neuropathy Wound Measurements Length: (cm) 1.8 Width: (cm) 2.3 Depth: (cm) 0.3 Area: (cm) 3.252 Volume: (cm) 0.975 % Reduction in Area: -158.7% % Reduction in Volume: -673.8% Epithelialization: Small (1-33%) Tunneling: No Undermining: No Wound Description Classification: Partial Thickness Diabetic Severity (Wagner): Grade 1 Wound Margin: Distinct, outline attache Exudate Amount: Medium Exudate Type: Serous Exudate Color: amber Foul Odor After Cleansing: No d Wound Bed Granulation Amount: Large (67-100%) Exposed Structure Granulation Quality: Red, Hyper-granulation Fascia Exposed: No Necrotic Amount: None Present (0%) Fat Layer Exposed: No Tendon Exposed: No Muscle Exposed: No Joint Exposed: No Bone Exposed: No Limited to Skin Breakdown Periwound Skin Texture Texture Color No Abnormalities Noted: No No Abnormalities Noted: No Talsma, Billye E. ([id number]) Callus: No Atrophie Blanche: No Crepitus: No Cyanosis: No Excoriation: No Ecchymosis: No Fluctuance: No Erythema: No Friable: No Hemosiderin Staining: No Induration: No Mottled: No Localized Edema: Yes Pallor: No Rash: No Rubor: No Scarring: No Temperature / Pain Moisture Temperature: No  Abnormality No Abnormalities Noted: No Tenderness on Palpation: Yes Dry / Scaly: No Maceration: No Moist: Yes Wound Preparation Ulcer Cleansing: Other: soap and water, Topical Anesthetic Applied: Other: lidocaine 4%, Treatment Notes Wound #1 (Right, Medial Lower Leg) 1. Cleansed with: Clean wound with Normal Saline 2. Anesthetic Topical Lidocaine 4% cream to wound bed prior to debridement 4. Dressing Applied: Prisma Ag 7. Secured with 2 Layer Compression System - Right Lower Extremity  Notes contact layer to posterior distal lower leg Electronic Signature(s) Signed: 09/20/2014 1:37:20 PM By: Elpidio Eric BSN, RN Entered By: Elpidio Eric on 09/20/2014 13:15:09 Nicholas Leonard ([id number]) -------------------------------------------------------------------------------- Vitals Details Patient Name: Nicholas Leonard. Date of Service: 09/20/2014 1:00 PM Medical Record Number: [id number] Patient Account Number: [id number] Date of Birth/Sex: 11-Dec-1945 (69 y.o. Male) Treating RN: Clover Mealy, RN, BSN, [place] Primary Care Physician: Daniel Nones Other Clinician: Referring Physician: Daniel Nones Treating Physician/Extender: Rudene Re in Treatment: 10 Vital Signs Time Taken: 13:08 Temperature (F): 98.0 Height (in): 72 Pulse (bpm): 61 Weight (lbs): 215 Respiratory Rate (breaths/min): 18 Body Mass Index (BMI): 29.2 Blood Pressure (mmHg): 109/64 Reference Range: 80 - 120 mg / dl Electronic Signature(s) Signed: 09/20/2014 1:37:20 PM By: Elpidio Eric BSN, RN Entered By: Elpidio Eric on 09/20/2014 13:08:29

## 2014-09-21 NOTE — Progress Notes (Signed)
Nicholas Leonard, Nicholas Leonard ([id number]) Visit Report for 09/20/2014 Chief Complaint Document Details Patient Name: Nicholas Leonard, Nicholas Leonard. Date of Service: 09/20/2014 1:00 PM Medical Record Number: [id number] Patient Account Number: [id number] Date of Birth/Sex: 09/24/1945 (69 y.o. Male) Treating RN: Primary Care Physician: Daniel Nones Other Clinician: Referring Physician: Daniel Nones Treating Physician/Extender: Rudene Re in Treatment: 10 Information Obtained from: Patient Chief Complaint Patient presents to the wound care center for a consult due non healing wound ulcer on the right lower limb which was noted to be there for about 3 weeks now. he is also had swelling of both lower extremities for about 2 months now. Electronic Signature(s) Signed: 09/20/2014 3:51:19 PM By: Evlyn Kanner MD, FACS Entered By: Evlyn Kanner on 09/20/2014 13:22:11 Nicholas Leonard ([id number]) -------------------------------------------------------------------------------- HPI Details Patient Name: Nicholas Leonard, Nicholas Leonard. Date of Service: 09/20/2014 1:00 PM Medical Record Number: [id number] Patient Account Number: [id number] Date of Birth/Sex: Jul 21, 1969 (69 y.o. Male) Treating RN: Primary Care Physician: Daniel Nones Other Clinician: Referring Physician: Daniel Nones Treating Physician/Extender: Rudene Re in Treatment: 10 History of Present Illness HPI Description: 69 year old gentleman who is recently discharged from [facility] and was admitted between 3 18-3/22 by his primary care physician Dr. Daniel Nones. He has adult onset diabetes and was recently admitted for acute on chronic left-sided systolic congestive heart failure, acute on chronic renal failure, coronary artery disease, ischemic cardiomyopathy, ventricular fibrillation status post AICD implantation, history of atrial fibrillation on Coumadin therapy, hypothyroidism, history of venous stasis ulcer, pulmonary embolism with  deep vein thrombosis on chronic Coumadin therapy, osteoarthritis and gout. He does not smoke any longer and occasionally has alcohol. as not been compliant with getting his blood sugars checked and does not know the last time he had a hemoglobin A1c done. Does not recall when the last ultrasound of his lower extremities was done but he does say that he has had clots in his legs. 70/19/2016 the patient is back to [place] after a recent admission and discharge from the hospital and he was there between 07/19/2014 and 07/26/2014. Reviewed these notes and his final diagnosis was #1 elevated INR, #2 acute on chronic left-sided systolic congestive heart failure, #3 hypothyroidism, #4 gout, #5 chronic kidney disease stage III, #6 acute renal failure, #7 carotid artery disease, #8 history of pulmonary embolism and DVT, #9 adult onset diabetes mellitus. During the admission he was corrected with giving vitamin K and due to his difficulty with Coumadin he was changed over to Eliquis. The patient also was found to have diffuse edema and there was evidence of ascites on abdominal ultrasound and was placed on a Lasix drip by nephrologist. His just discharge medications were noted by me. they have been using Santyl on his right lower extremity wound and he has been doing this dressing as often as the nurse comes in. he has also developed a ulcer of the left thigh and this has been there for about 2 weeks and has some necrosis of skin over there. 08/23/2014 -- he recently saw the vascular surgeon Dr. Gilda Crease and has had both an arterial and duplex venous study done. Though we do not have the official report yet the patient tells me that his arteries were okay and there was no problem with the blood flow. 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 08/30/2014 -- his vascular surgery has been scheduled for Tuesday, May 24 and he  will come back and see [place] if he needs to soon after that for a rewrap. Addendum: We have finally received his transcript of his office visit with Dr. Gilda Crease on 08/22/2014 and note that the duplex US of the lower extremity arterial system bilaterally SFA are patent, with diffuse tibial disease with monophasic tibial signals on the right and biphasic signals on the left. Duplex Venous ultrasound of the lower extremity shows normal deep venous system, superficial reflux is present over a short segment of the GSV in the region of the ulcer. However the GSV from the knees to the groin is competent. The recommendations were for the patient to have angiography of the lower extremity with the hope of JEANLUC, WEGMAN. ([id number]) intervention for limb salvage. Once the arterial compromise has been addressed then the venous portion can be more effectively treated. 09/05/2014 - s/p RLE angiogram without intervention 09/03/2014 by Dr Gilda Crease. No new complaints today. No significant pain. No fever or chills. Minimal drainage. 09/13/2014 -- On 09/03/2014 o Procedure(s) Performed: 1. Abdominal aortogram 2. Right lower extremity distal runoff third order catheter placement 3. Additional right third order catheter placement as per the patient the vascular surgeon will see him back in 6 months and no other procedures planned for in the near future. Electronic Signature(s) Signed: 09/20/2014 3:51:19 PM By: Evlyn Kanner MD, FACS Entered By: Evlyn Kanner on 09/20/2014 13:23:47 Nicholas Leonard ([id number]) -------------------------------------------------------------------------------- Physical Exam Details Patient Name: Nicholas Leonard, Nicholas Leonard. Date of Service: 09/20/2014 1:00 PM Medical Record Number: [id number] Patient Account Number: [id number] Date of Birth/Sex: 1945/12/31 (69 y.o. Male) Treating RN: Primary Care Physician: Daniel Nones Other Clinician: Referring Physician: Daniel Nones Treating  Physician/Extender: Rudene Re in Treatment: 10 Constitutional . Pulse regular. Respirations normal and unlabored. Afebrile. . Eyes Nonicteric. Reactive to light. Ears, Nose, Mouth, and Throat Lips, teeth, and gums WNL.Marland Kitchen Moist mucosa without lesions . Neck supple and nontender. No palpable supraclavicular or cervical adenopathy. Normal sized without goiter. Respiratory WNL. No retractions.. Cardiovascular Pedal Pulses WNL - though weakly palpable. No clubbing, cyanosis or edema. Musculoskeletal Adexa without tenderness or enlargement.. Digits and nails w/o clubbing, cyanosis, infection, petechiae, ischemia, or inflammatory conditions.. Integumentary (Hair, Skin) the ulceration in the right lower oximetry has filled out nicely and is more shallow and the erythema is looking much better.. No crepitus or fluctuance. No peri-wound warmth or erythema. No masses.Marland Kitchen Psychiatric Judgement and insight Intact.. No evidence of depression, anxiety, or agitation.. Electronic Signature(s) Signed: 09/20/2014 3:51:19 PM By: Evlyn Kanner MD, FACS Entered By: Evlyn Kanner on 09/20/2014 13:24:40 Nicholas Leonard ([id number]) -------------------------------------------------------------------------------- Physician Orders Details Patient Name: Nicholas Leonard, Nicholas Leonard. Date of Service: 09/20/2014 1:00 PM Medical Record Number: [id number] Patient Account Number: [id number] Date of Birth/Sex: 07/06/1945 (69 y.o. Male) Treating RN: Huel Coventry Primary Care Physician: Daniel Nones Other Clinician: Referring Physician: Daniel Nones Treating Physician/Extender: Rudene Re in Treatment: 10 Verbal / Phone Orders: Yes Clinician: Huel Coventry Read Back and Verified: Yes Diagnosis Coding Wound Cleansing Wound #1 Right,Medial Lower Leg o May shower with protection. o No tub bath. Anesthetic Wound #1 Right,Medial Lower Leg o Topical Lidocaine 4% cream applied to wound bed prior to  debridement Primary Wound Dressing Wound #1 Right,Medial Lower Leg o Prisma Ag Secondary Dressing Wound #1 Right,Medial Lower Leg o ABD and Kerlix/Conform Dressing Change Frequency Wound #1 Right,Medial Lower Leg o Change dressing every week Follow-up Appointments Wound #1 Right,Medial Lower Leg o Return Appointment in 1 week. Edema Control Wound #1 Right,Medial  Lower Leg o 2 Layer Lite Compression System - Right Lower Extremity Notes Authorization for [employer] Electronic Signature(s) Signed: 09/20/2014 3:51:19 PM By: Evlyn Kanner MD, FACS EYAL, GREENHAW ([id number]) Signed: 09/20/2014 4:53:24 PM By: Elliot Gurney RN, BSN, Kim RN, BSN Entered By: Elliot Gurney, RN, BSN, Kim on 09/20/2014 13:22:42 Nicholas Leonard ([id number]) -------------------------------------------------------------------------------- Problem List Details Patient Name: Nicholas Leonard, Nicholas Leonard. Date of Service: 09/20/2014 1:00 PM Medical Record Number: [id number] Patient Account Number: [id number] Date of Birth/Sex: 10-Dec-1945 (69 y.o. 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 Date of Service: 09/20/2014 1:00 PM Medical Record Number: [id number] Patient Account Number: [id number] Date of Birth/Sex: Jan 10, 1946 (69 y.o. Male) Treating RN: Primary Care Physician: Daniel Nones Other Clinician: Referring Physician: Daniel Nones Treating Physician/Extender: Rudene Re in Treatment: 10 Subjective Chief Complaint Information obtained from Patient Patient presents to the wound care center for a consult due non healing wound ulcer on the right lower limb which was noted to be there for about 3 weeks now. he is also had swelling of both lower extremities for about 2 months now. History of Present Illness (HPI) 69 year old gentleman who is recently discharged from [facility] and was admitted between 3 18-3/22 by his primary care physician Dr. Daniel Nones. He has adult onset diabetes and was recently admitted for acute on chronic left-sided systolic congestive heart failure, acute on chronic renal failure, coronary artery disease, ischemic cardiomyopathy, ventricular fibrillation status post AICD implantation, history of atrial fibrillation on Coumadin therapy, hypothyroidism, history of venous stasis ulcer, pulmonary embolism with deep vein thrombosis on chronic Coumadin therapy, osteoarthritis and gout. He does not smoke any longer and occasionally has alcohol. as not been compliant with getting his blood sugars checked and does not know the last time he had a hemoglobin A1c done. Does not recall when the last ultrasound of his lower extremities was done but he does say that he has had clots in his legs. 70/19/2016 the patient is back  to [place] after a recent admission and discharge from the hospital and he was there between 07/19/2014 and 07/26/2014. Reviewed these notes and his final diagnosis was #1 elevated INR, #2 acute on chronic left-sided systolic congestive heart failure, #3 hypothyroidism, #4 gout, #5 chronic kidney disease stage III, #6 acute renal failure, #7 carotid artery disease, #8 history of pulmonary embolism and DVT, #9 adult onset diabetes mellitus. During the admission he was corrected with giving vitamin K and due to his difficulty with Coumadin he was changed over to Eliquis. The patient also was found to have diffuse edema and there was evidence of ascites on abdominal ultrasound and was placed on  a Lasix drip by nephrologist. His just discharge medications were noted by me. they have been using Santyl on his right lower extremity wound and he has been doing this dressing as often as the nurse comes in. he has also developed a ulcer of the left thigh and this has been there for about 2 weeks and has some necrosis of skin over there. 08/23/2014 -- he recently saw the vascular surgeon Dr. Gilda Crease and has had both an arterial and duplex venous study done. Though we do not have the official report yet the patient tells me that his arteries were okay and there was no problem with the blood flow. 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 08/30/2014 -- his vascular surgery has been scheduled for Tuesday, May 24 and he will come back and see Nicholas Leonard, Nicholas Leonard ([id number]) [place] if he needs to soon after that for a rewrap. Addendum: We have finally received his transcript of his office visit with Dr. Gilda Crease on 08/22/2014 and note that the duplex US of the lower extremity arterial system bilaterally SFA are patent, with diffuse tibial disease with monophasic tibial signals on the right and biphasic signals on the left. Duplex  Venous ultrasound of the lower extremity shows normal deep venous system, superficial reflux is present over a short segment of the GSV in the region of the ulcer. However the GSV from the knees to the groin is competent. The recommendations were for the patient to have angiography of the lower extremity with the hope of intervention for limb salvage. Once the arterial compromise has been addressed then the venous portion can be more effectively treated. 09/05/2014 - s/p RLE angiogram without intervention 09/03/2014 by Dr Gilda Crease. No new complaints today. No significant pain. No fever or chills. Minimal drainage. 09/13/2014 -- On 09/03/2014 Procedure(s) Performed: 1. Abdominal aortogram 2. Right lower extremity distal runoff third order catheter placement 3. Additional right third order catheter placement as per the patient the vascular surgeon will see him back in 6 months and no other procedures planned for in the near future. Objective Constitutional Pulse regular. Respirations normal and unlabored. Afebrile. Vitals Time Taken: 1:08 PM, Height: 72 in, Weight: 215 lbs, BMI: 29.2, Temperature: 98.0 F, Pulse: 61 bpm, Respiratory Rate: 18 breaths/min, Blood Pressure: 109/64 mmHg. Eyes Nonicteric. Reactive to light. Ears, Nose, Mouth, and Throat Lips, teeth, and gums WNL.Marland Kitchen Moist mucosa without lesions . Neck supple and nontender. No palpable supraclavicular or cervical adenopathy. Normal sized without goiter. Respiratory WNL. No retractions.. Cardiovascular Pedal Pulses WNL - though weakly palpable. No clubbing, cyanosis or edema. Musculoskeletal Adexa without tenderness or enlargement.. Digits and nails w/o clubbing, cyanosis, infection, petechiae, Chaddock, Selby E. ([id number]) ischemia, or inflammatory conditions.Marland Kitchen Psychiatric Judgement and insight Intact.. No evidence of depression, anxiety, or agitation.. Integumentary (Hair, Skin) the ulceration in the right lower oximetry has  filled out nicely and is more shallow and the erythema is looking much better.. No crepitus or fluctuance. No peri-wound warmth or erythema. No masses.. Wound #1 status is Open. Original cause of wound was Gradually Appeared. The wound is located on the Right,Medial Lower Leg. The wound measures 1.8cm length x 2.3cm width x 0.3cm depth; 3.252cm^2 area and 0.975cm^3 volume. The wound is limited to skin breakdown. There is no tunneling or undermining noted. There is a medium amount of serous drainage noted. The wound margin is distinct with the outline attached to the wound  base. There is large (67-100%) red granulation within the wound bed. There is no necrotic tissue within the wound bed. The periwound skin appearance exhibited: Localized Edema, Moist. The periwound skin appearance did not exhibit: Callus, Crepitus, Excoriation, Fluctuance, Friable, Induration, Rash, Scarring, Dry/Scaly, Maceration, Atrophie Blanche, Cyanosis, Ecchymosis, Hemosiderin Staining, Mottled, Pallor, Rubor, Erythema. Periwound temperature was noted as No Abnormality. The periwound has tenderness on palpation. 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 We are now continuing with treatment of his vascular insufficiency and will start working with some biologic skin graft to help in the healing process.  We'll continue with compression with a 2 layer wrap and elevation of the limb as much as possible. Nicholas Leonard, Nicholas Leonard ([id number]) He will come back and see me next week. Plan Wound Cleansing: Wound #1 Right,Medial Lower Leg: May shower with protection. No tub bath. Anesthetic: Wound #1 Right,Medial Lower Leg: Topical Lidocaine 4% cream applied to wound bed prior to debridement Primary Wound Dressing: Wound #1 Right,Medial Lower Leg: Prisma Ag Secondary Dressing: Wound #1 Right,Medial Lower Leg: ABD and Kerlix/Conform Dressing Change Frequency: Wound #1 Right,Medial Lower Leg: Change dressing every week Follow-up Appointments: Wound #1 Right,Medial Lower Leg: Return Appointment in 1 week. Edema Control: Wound #1 Right,Medial Lower Leg: 2 Layer Lite Compression System - Right Lower Extremity General Notes: Authorization for [employer] At this stage the patient's vascular issue (though of a mixed picture), his arterial studies and intervention has been done. We are now continuing with treatment of his vascular insufficiency and will start working with some biologic skin graft to help in the healing process. We'll continue with compression with a 2 layer wrap and elevation of the limb as much as possible. He will come back and see me next week. Electronic Signature(s) Signed: 09/20/2014 3:51:19 PM By: Evlyn Kanner MD, FACS Entered By: Evlyn Kanner on 09/20/2014 13:26:48 Nicholas Leonard, Nicholas Leonard ([id number]) [name], [name] ([id number]) -------------------------------------------------------------------------------- SuperBill Details Patient Name: Nicholas Leonard. Date of Service: 09/20/2014 Medical Record Number: [id number] Patient Account Number: [id number] Date of Birth/Sex: [birth year] (69 y.o. 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 L97.122 Non-pressure chronic ulcer of left thigh with fat layer exposed I70.232 Atherosclerosis of native arteries of right leg with ulceration of calf Facility Procedures CPT4: Description Modifier Quantity Code [id number] (Facility Use Only) 25003BC - APPLY MULTLAY COMPRS LWR RT 1 LEG Physician Procedures CPT4: Description Modifier Quantity Code 4888916 99213 - WC PHYS LEVEL 3 - EST PT 1 ICD-10 Description Diagnosis E11.622 Type 2 diabetes mellitus with other skin ulcer I87.311 Chronic  venous hypertension (idiopathic) with ulcer of right lower extremity  I80.291 Phlebitis and thrombophlebitis of other deep vessels of right lower extremity Electronic Signature(s) Signed: 09/20/2014 3:51:19 PM By: Evlyn Kanner MD, FACS Entered By: Evlyn Kanner on 09/20/2014 13:27:29

## 2014-09-27 ENCOUNTER — Encounter: Payer: Medicare Other | Admitting: Surgery

## 2014-09-27 DIAGNOSIS — L97919 Non-pressure chronic ulcer of unspecified part of right lower leg with unspecified severity: Secondary | ICD-10-CM | POA: Diagnosis not present

## 2014-10-04 ENCOUNTER — Encounter: Payer: Medicare Other | Admitting: Surgery

## 2014-10-04 DIAGNOSIS — L97919 Non-pressure chronic ulcer of unspecified part of right lower leg with unspecified severity: Secondary | ICD-10-CM | POA: Diagnosis not present

## 2014-10-04 NOTE — Progress Notes (Signed)
BRICETON, SCHLICHTE ([id number]) Visit Report for 10/04/2014 Chief Complaint Document Details Patient Name: Nicholas Leonard, Nicholas Leonard. Date of Service: 10/04/2014 1:15 PM Medical Record Number: [id number] Patient Account Number: [id number] Date of Birth/Sex: April 10, 1946 (69 y.o. Male) Treating RN: Primary Care Physician: Daniel Nones Other Clinician: Referring Physician: Daniel Nones Treating Physician/Extender: Rudene Re in Treatment: 12 Information Obtained from: Patient Chief Complaint Patient presents to the wound care center for a consult due non healing wound ulcer on the right lower limb which was noted to be there for about 3 weeks now. he is also had swelling of both lower extremities for about 2 months now. Electronic Signature(s) Signed: 10/04/2014 1:44:39 PM By: Evlyn Kanner MD, FACS Entered By: Evlyn Kanner on 10/04/2014 13:44:39 [name] ([id number]) -------------------------------------------------------------------------------- HPI Details Patient Name: Nicholas Leonard. Date of Service: 10/04/2014 1:15 PM Medical Record Number: [id number] Patient Account Number: [id number] Date of Birth/Sex: 1946-01-04 (69 y.o. Male) Treating RN: Primary Care Physician: Daniel Nones Other Clinician: Referring Physician: Daniel Nones Treating Physician/Extender: Rudene Re in Treatment: 12 History of Present Illness HPI Description: 69 year old gentleman who is recently discharged from [facility] and was admitted between 3 18-3/22 by his primary care physician Dr. Daniel Nones. He has adult onset diabetes and was recently admitted for acute on chronic left-sided systolic congestive heart failure, acute on chronic renal failure, coronary artery disease, ischemic cardiomyopathy, ventricular fibrillation status post AICD implantation, history of atrial fibrillation on Coumadin therapy, hypothyroidism, history of venous stasis ulcer, pulmonary embolism with  deep vein thrombosis on chronic Coumadin therapy, osteoarthritis and gout. He does not smoke any longer and occasionally has alcohol. as not been compliant with getting his blood sugars checked and does not know the last time he had a hemoglobin A1c done. Does not recall when the last ultrasound of his lower extremities was done but he does say that he has had clots in his legs. 07/30/2014 the patient is back to [place] after a recent admission and discharge from the hospital and he was there between 07/19/2014 and 07/26/2014. Reviewed these notes and his final diagnosis was #1 elevated INR, #2 acute on chronic left-sided systolic congestive heart failure, #3 hypothyroidism, #4 gout, #5 chronic kidney disease stage III, #6 acute renal failure, #7 carotid artery disease, #8 history of pulmonary embolism and DVT, #9 adult onset diabetes mellitus. During the admission he was corrected with giving vitamin K and due to his difficulty with Coumadin he was changed over to Eliquis. The patient also was found to have diffuse edema and there was evidence of ascites on abdominal ultrasound and was placed on a Lasix drip by nephrologist. His just discharge medications were noted by me. they have been using Santyl on his right lower extremity wound and he has been doing this dressing as often as the nurse comes in. he has also developed a ulcer of the left thigh and this has been there for about 2 weeks and has some necrosis of skin over there. 08/23/2014 -- he recently saw the vascular surgeon Dr. Gilda Crease and has had both an arterial and duplex venous study done. Though we do not have the official report yet the patient tells me that his arteries were okay and there was no problem with the blood flow. 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 08/30/2014 -- his vascular surgery has been scheduled for Tuesday, May 24 and he  will come back and see [place] if he needs to soon after that for a rewrap. Addendum: We have finally received his transcript of his office visit with Dr. Gilda Crease on 08/22/2014 and note that the duplex US of the lower extremity arterial system bilaterally SFA are patent, with diffuse tibial disease with monophasic tibial signals on the right and biphasic signals on the left. Duplex Venous ultrasound of the lower extremity shows normal deep venous system, superficial reflux is present over a short segment of the GSV in the region of the ulcer. However the GSV from the knees to the groin is competent. The recommendations were for the patient to have angiography of the lower extremity with the hope of RIDDIK, SENNA. ([id number]) intervention for limb salvage. Once the arterial compromise has been addressed then the venous portion can be more effectively treated. 09/05/2014 - s/p RLE angiogram without intervention 09/03/2014 by Dr Gilda Crease. No new complaints today. No significant pain. No fever or chills. Minimal drainage. 09/13/2014 -- On 09/03/2014 o Procedure(s) Performed: 1. Abdominal aortogram 2. Right lower extremity distal runoff third order catheter placement 3. Additional right third order catheter placement as per the patient the vascular surgeon will see him back in 6 months and no other procedures planned for in the near future. 09/27/2014 -- He is here for his first application of a EpiFix which was approved by his insurance company. Electronic Signature(s) Signed: 10/04/2014 1:44:43 PM By: Evlyn Kanner MD, FACS Entered By: Evlyn Kanner on 10/04/2014 13:44:43 [name] ([id number]) -------------------------------------------------------------------------------- Physical Exam Details Patient Name: Nicholas Leonard. Date of Service: 10/04/2014 1:15 PM Medical Record Number: [id number] Patient Account Number: [id number] Date of Birth/Sex: [birth year] (69 y.o. Male) Treating  RN: Primary Care Physician: Daniel Nones Other Clinician: Referring Physician: Daniel Nones Treating Physician/Extender: Rudene Re in Treatment: 12 Constitutional . Pulse regular. Respirations normal and unlabored. Afebrile. . Eyes Nonicteric. Reactive to light. Ears, Nose, Mouth, and Throat Lips, teeth, and gums WNL.Marland Kitchen Moist mucosa without lesions . Neck supple and nontender. No palpable supraclavicular or cervical adenopathy. Normal sized without goiter. Respiratory WNL. No retractions.. Cardiovascular Pedal Pulses WNL. No clubbing, cyanosis or edema. Musculoskeletal Adexa without tenderness or enlargement.. Digits and nails w/o clubbing, cyanosis, infection, petechiae, ischemia, or inflammatory conditions.. Integumentary (Hair, Skin) after his first application of A fixed the areas looking very good and is caught some epithelialization.. No crepitus or fluctuance. No peri-wound warmth or erythema. No masses.Marland Kitchen Psychiatric Judgement and insight Intact.. No evidence of depression, anxiety, or agitation.. Electronic Signature(s) Signed: 10/04/2014 1:45:20 PM By: Evlyn Kanner MD, FACS Entered By: Evlyn Kanner on 10/04/2014 13:45:20 [name] ([id number]) -------------------------------------------------------------------------------- Physician Orders Details Patient Name: ASRIEL, WESTRUP. Date of Service: 10/04/2014 1:15 PM Medical Record Number: [id number] Patient Account Number: [id number] Date of Birth/Sex: Aug 29, 1945 (69 y.o. Male) Treating RN: Clover Mealy, RN, BSN, [place] Sink Primary Care Physician: Daniel Nones Other Clinician: Referring Physician: Daniel Nones Treating Physician/Extender: Rudene Re in Treatment: 12 Verbal / Phone Orders: Yes Clinician: Afful, RN, BSN, Rita Read Back and Verified: Yes Diagnosis Coding Wound Cleansing Wound #1 Right,Medial Lower Leg o May shower with protection. o No tub bath. Anesthetic Wound #1 Right,Medial  Lower Leg o Topical Lidocaine 4% cream applied to wound bed prior to debridement Primary Wound Dressing Wound #1 Right,Medial Lower Leg o Other: - Mepitel Secondary Dressing Wound #1 Right,Medial Lower Leg o ABD and Kerlix/Conform Dressing Change Frequency Wound #1 Right,Medial Lower Leg o Change dressing every week Follow-up Appointments Wound #  1 Right,Medial Lower Leg o Return Appointment in 1 week. Edema Control Wound #1 Right,Medial Lower Leg o 2 Layer Lite Compression System - Right Lower Extremity Notes order 14mm Epifix for 10/10/14 Electronic Signature(s) Signed: 10/04/2014 1:53:51 PM By: Elpidio Eric BSN, RN [name] ([id number]) Signed: 10/04/2014 3:11:40 PM By: Evlyn Kanner MD, FACS Previous Signature: 10/04/2014 1:40:21 PM Version By: Elpidio Eric BSN, RN Previous Signature: 10/04/2014 1:39:58 PM Version By: Elpidio Eric BSN, RN Entered By: Elpidio Eric on 10/04/2014 13:53:51 Schuenemann, [name] ([id number]) -------------------------------------------------------------------------------- Problem List Details Patient Name: ZACK, CRAGER. Date of Service: 10/04/2014 1:15 PM Medical Record Number: [id number] Patient Account Number: [id number] Date of Birth/Sex: [birth year] (69 y.o. 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 Date of Service: 10/04/2014 1:15 PM Medical Record Number: [id number] Patient Account Number: [id number] Date of Birth/Sex: 1945-06-20 (69 y.o. Male) Treating RN: Primary Care Physician: Daniel Nones Other Clinician: Referring Physician: Daniel Nones Treating Physician/Extender: Rudene Re in Treatment: 12 Subjective Chief Complaint Information obtained from Patient Patient presents to the wound care center for a consult due non healing wound ulcer on the right lower limb which was noted to be there for about 3 weeks now. he is also had swelling of both lower extremities for about 2 months now. History of Present Illness (HPI) 69 year old gentleman who is recently discharged from [facility] and was admitted between 3 18-3/22 by his primary care physician Dr. Daniel Nones. He has adult onset diabetes and was recently admitted for acute on chronic left-sided systolic congestive heart failure, acute on chronic renal failure, coronary artery disease, ischemic cardiomyopathy, ventricular fibrillation status post AICD implantation, history of atrial fibrillation on Coumadin therapy, hypothyroidism, history of venous stasis ulcer, pulmonary embolism with deep vein thrombosis on chronic Coumadin therapy, osteoarthritis and gout. He does not smoke any longer and occasionally has alcohol. as not been compliant with getting his blood sugars  checked and does not know the last time he had a hemoglobin A1c done. Does not recall when the last ultrasound of his lower extremities was done but he does say that he has had clots in his legs. 07/30/2014 the patient is back to [place] after a recent admission and discharge from the hospital and he was there between 07/19/2014 and 07/26/2014. Reviewed these notes and his final diagnosis was #1 elevated INR, #2 acute on chronic left-sided systolic congestive heart failure, #3 hypothyroidism, #4 gout, #5 chronic kidney disease stage III, #6 acute renal failure, #7 carotid artery disease, #8 history of pulmonary embolism and DVT, #9 adult onset diabetes mellitus. During the admission he was corrected with giving vitamin K  and due to his difficulty with Coumadin he was changed over to Eliquis. The patient also was found to have diffuse edema and there was evidence of ascites on abdominal ultrasound and was placed on a Lasix drip by nephrologist. His just discharge medications were noted by me. they have been using Santyl on his right lower extremity wound and he has been doing this dressing as often as the nurse comes in. he has also developed a ulcer of the left thigh and this has been there for about 2 weeks and has some necrosis of skin over there. 08/23/2014 -- he recently saw the vascular surgeon Dr. Gilda Crease and has had both an arterial and duplex venous study done. Though we do not have the official report yet the patient tells me that his arteries were okay and there was no problem with the blood flow. 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 08/30/2014 -- his vascular surgery has been scheduled for Tuesday, May 24 and he will come back and see IDA, UPPAL ([id number]) [place] if he needs to soon after that for a rewrap. Addendum: We have finally received his transcript of his office visit with Dr.  Gilda Crease on 08/22/2014 and note that the duplex US of the lower extremity arterial system bilaterally SFA are patent, with diffuse tibial disease with monophasic tibial signals on the right and biphasic signals on the left. Duplex Venous ultrasound of the lower extremity shows normal deep venous system, superficial reflux is present over a short segment of the GSV in the region of the ulcer. However the GSV from the knees to the groin is competent. The recommendations were for the patient to have angiography of the lower extremity with the hope of intervention for limb salvage. Once the arterial compromise has been addressed then the venous portion can be more effectively treated. 09/05/2014 - s/p RLE angiogram without intervention 09/03/2014 by Dr Gilda Crease. No new complaints today. No significant pain. No fever or chills. Minimal drainage. 09/13/2014 -- On 09/03/2014 Procedure(s) Performed: 1. Abdominal aortogram 2. Right lower extremity distal runoff third order catheter placement 3. Additional right third order catheter placement as per the patient the vascular surgeon will see him back in 6 months and no other procedures planned for in the near future. 09/27/2014 -- He is here for his first application of a EpiFix which was approved by his insurance company. Objective Constitutional Pulse regular. Respirations normal and unlabored. Afebrile. Vitals Time Taken: 1:26 PM, Height: 72 in, Weight: 215 lbs, BMI: 29.2, Temperature: 97.6 F, Pulse: 69 bpm, Respiratory Rate: 16 breaths/min, Blood Pressure: 98/59 mmHg. Eyes Nonicteric. Reactive to light. Ears, Nose, Mouth, and Throat Lips, teeth, and gums WNL.Marland Kitchen Moist mucosa without lesions . Neck supple and nontender. No palpable supraclavicular or cervical adenopathy. Normal sized without goiter. Respiratory WNL. No retractions.. Cardiovascular Pedal Pulses WNL. No clubbing, cyanosis or edema. DASHAN, CHIZMAR  ([id number]) Musculoskeletal Adexa without tenderness or enlargement.. Digits and nails w/o clubbing, cyanosis, infection, petechiae, ischemia, or inflammatory conditions.Marland Kitchen Psychiatric Judgement and insight Intact.. No evidence of depression, anxiety, or agitation.. Integumentary (Hair, Skin) after his first application of A fixed the areas looking very good and is caught some epithelialization.. No crepitus or fluctuance. No peri-wound warmth or erythema. No masses.. Wound #1 status is Open. Original cause of wound was Gradually Appeared. The wound is located on the Right,Medial Lower Leg. The wound measures 1cm length x  1.2cm width x 0.1cm depth; 0.942cm^2 area and 0.094cm^3 volume. The wound is limited to skin breakdown. There is no tunneling or undermining noted. There is a medium amount of serous drainage noted. The wound margin is distinct with the outline attached to the wound base. There is large (67-100%) red granulation within the wound bed. There is no necrotic tissue within the wound bed. The periwound skin appearance exhibited: Localized Edema, Moist. The periwound skin appearance did not exhibit: Callus, Crepitus, Excoriation, Fluctuance, Friable, Induration, Rash, Scarring, Dry/Scaly, Maceration, Atrophie Blanche, Cyanosis, Ecchymosis, Hemosiderin Staining, Mottled, Pallor, Rubor, Erythema. Periwound temperature was noted as No Abnormality. The periwound has tenderness on palpation. 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 He will come back for a second application of epifix next week. GILDARDO, TICKNER ([id number]) Plan Wound Cleansing: Wound #1 Right,Medial Lower Leg: May shower with protection. No tub bath. Anesthetic: Wound #1 Right,Medial Lower Leg: Topical Lidocaine 4% cream applied to wound bed prior to debridement Primary Wound Dressing: Wound #1 Right,Medial Lower Leg: Other: - Mepitel Secondary Dressing: Wound #1 Right,Medial Lower Leg: ABD and Kerlix/Conform Dressing Change Frequency: Wound #1 Right,Medial Lower Leg: Change dressing every week Follow-up Appointments: Wound #1 Right,Medial Lower Leg: Return Appointment in 1 week. Edema Control: Wound #1 Right,Medial Lower Leg: 2 Layer Lite Compression System - Right Lower Extremity General Notes: order 14mm Epifix for 10/10/14 His wound is looking very good and we will use a piece of Mepitel and compression this week. He will come back for a second application of epifix next week. Electronic Signature(s) Signed: 10/04/2014 3:03:51 PM By: Evlyn Kanner MD, FACS Previous Signature: 10/04/2014 1:46:12 PM Version By: Evlyn Kanner MD, FACS Entered By: Evlyn Kanner on 10/04/2014 15:03:51 [name] ([id number]) -------------------------------------------------------------------------------- SuperBill Details Patient Name: JAIMEN, MELONE. Date of Service: 10/04/2014 Medical Record Number: [id number] Patient Account Number: [id number] Date of Birth/Sex: January 26, 1946 (69 y.o. 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 L97.122 Non-pressure chronic ulcer of left thigh with fat layer exposed I70.232 Atherosclerosis of native arteries of right leg with ulceration of calf Facility Procedures CPT4 Code: [id number] Description: 63875 - WOUND CARE VISIT-LEV 2 EST PT Modifier: Quantity: 1 Physician Procedures CPT4 Code Description: [id number] - WC PHYS LEVEL 3 - EST PT ICD-10 Description Diagnosis E11.622 Type 2 diabetes mellitus with other skin ulcer I87.311 Chronic venous hypertension (idiopathic) with ulcer of Modifier: right lower ext Quantity: 1 remity Electronic Signature(s) Signed: 10/04/2014 1:47:00 PM By: Evlyn Kanner MD, FACS Entered By: Evlyn Kanner on 10/04/2014 13:47:00

## 2014-10-04 NOTE — Progress Notes (Signed)
JAYCEION, THERRIAULT ([id number]) Visit Report for 10/04/2014 Arrival Information Details Patient Name: Nicholas Leonard, Nicholas Leonard. Date of Service: 10/04/2014 1:15 PM Medical Record Number: [id number] Patient Account Number: [id number] Date of Birth/Sex: [birth year] (69 y.o. Male) Treating RN: Clover Mealy, RN, BSN, [place] Sink Primary Care Physician: Daniel Nones Other Clinician: Referring Physician: Daniel Nones Treating Physician/Extender: Rudene Re in Treatment: 12 Visit Information History Since Last Visit Any new allergies or adverse reactions: No Patient Arrived: Ambulatory Had a fall or experienced change in No Arrival Time: 13:20 activities of daily living that may affect Accompanied By: wife risk of falls: Transfer Assistance: None Signs or symptoms of abuse/neglect since last No Patient Identification Verified: Yes visito Secondary Verification Process Yes Hospitalized since last visit: No Completed: Has Dressing in Place as Prescribed: Yes Patient Requires Transmission- No Pain Present Now: No Based Precautions: Patient Has Alerts: Yes Patient Alerts: Patient on Blood Thinner coumadin DMII ABI L 1.75 R 1.55 Electronic Signature(s) Signed: 10/04/2014 1:26:25 PM By: Elpidio Eric BSN, RN Entered By: Elpidio Eric on 10/04/2014 13:26:25 Nicholas Leonard ([id number]) -------------------------------------------------------------------------------- Clinic Level of Care Assessment Details Patient Name: Nicholas Leonard Date of Service: 10/04/2014 1:15 PM Medical Record Number: [id number] Patient Account Number: [id number] Date of Birth/Sex: June 22, 1945 (69 y.o. Male) Treating RN: Clover Mealy, RN, BSN, [place] Primary Care Physician: Daniel Nones Other Clinician: Referring Physician: Daniel Nones Treating Physician/Extender: Rudene Re in Treatment: 12 Clinic Level of Care Assessment Items TOOL 4 Quantity Score []  - Use when only an EandM is performed on FOLLOW-UP visit 0 ASSESSMENTS  - Nursing Assessment / Reassessment X - Reassessment of Co-morbidities (includes updates in patient status) 1 10 X - Reassessment of Adherence to Treatment Plan 1 5 ASSESSMENTS - Wound and Skin Assessment / Reassessment X - Simple Wound Assessment / Reassessment - one wound 1 5 []  - Complex Wound Assessment / Reassessment - multiple wounds 0 []  - Dermatologic / Skin Assessment (not related to wound area) 0 ASSESSMENTS - Focused Assessment []  - Circumferential Edema Measurements - multi extremities 0 []  - Nutritional Assessment / Counseling / Intervention 0 []  - Lower Extremity Assessment (monofilament, tuning fork, pulses) 0 []  - Peripheral Arterial Disease Assessment (using hand held doppler) 0 ASSESSMENTS - Ostomy and/or Continence Assessment and Care []  - Incontinence Assessment and Management 0 []  - Ostomy Care Assessment and Management (repouching, etc.) 0 PROCESS - Coordination of Care X - Simple Patient / Family Education for ongoing care 1 15 []  - Complex (extensive) Patient / Family Education for ongoing care 0 []  - Staff obtains Chiropractor, Records, Test Results / Process Orders 0 []  - Staff telephones HHA, Nursing Homes / Clarify orders / etc 0 []  - Routine Transfer to another Facility (non-emergent condition) 0 Nicholas Leonard, Nicholas Leonard ([id number]) []  - Routine Hospital Admission (non-emergent condition) 0 []  - New Admissions / Manufacturing engineer / Ordering NPWT, Apligraf, etc. 0 []  - Emergency Hospital Admission (emergent condition) 0 X - Simple Discharge Coordination 1 10 []  - Complex (extensive) Discharge Coordination 0 PROCESS - Special Needs []  - Pediatric / Minor Patient Management 0 []  - Isolation Patient Management 0 []  - Hearing / Language / Visual special needs 0 []  - Assessment of Community assistance (transportation, D/C planning, etc.) 0 []  - Additional assistance / Altered mentation 0 []  - Support Surface(s) Assessment (bed, cushion, seat, etc.)  0 INTERVENTIONS - Wound Cleansing / Measurement X - Simple Wound Cleansing - one wound 1 5 []  - Complex Wound Cleansing - multiple wounds 0 X -  Wound Imaging (photographs - any number of wounds) 1 5 []  - Wound Tracing (instead of photographs) 0 X - Simple Wound Measurement - one wound 1 5 []  - Complex Wound Measurement - multiple wounds 0 INTERVENTIONS - Wound Dressings X - Small Wound Dressing one or multiple wounds 1 10 []  - Medium Wound Dressing one or multiple wounds 0 []  - Large Wound Dressing one or multiple wounds 0 []  - Application of Medications - topical 0 []  - Application of Medications - injection 0 INTERVENTIONS - Miscellaneous []  - External ear exam 0 Nicholas Leonard, Nicholas E. ([id number]) []  - Specimen Collection (cultures, biopsies, blood, body fluids, etc.) 0 []  - Specimen(s) / Culture(s) sent or taken to Lab for analysis 0 []  - Patient Transfer (multiple staff / Michiel Sites Lift / Similar devices) 0 []  - Simple Staple / Suture removal (25 or less) 0 []  - Complex Staple / Suture removal (26 or more) 0 []  - Hypo / Hyperglycemic Management (close monitor of Blood Glucose) 0 []  - Ankle / Brachial Index (ABI) - do not check if billed separately 0 X - Vital Signs 1 5 Has the patient been seen at the hospital within the last three years: Yes Total Score: 75 Level Of Care: New/Established - Level 2 Electronic Signature(s) Signed: 10/04/2014 1:41:14 PM By: Elpidio Eric BSN, RN Entered By: Elpidio Eric on 10/04/2014 13:41:13 Nicholas Leonard ([id number]) -------------------------------------------------------------------------------- Encounter Discharge Information Details Patient Name: Nicholas Leonard. Date of Service: 10/04/2014 1:15 PM Medical Record Number: [id number] Patient Account Number: [id number] Date of Birth/Sex: 01/27/1946 (69 y.o. Male) Treating RN: Clover Mealy, RN, BSN, [place] Primary Care Physician: Daniel Nones Other Clinician: Referring Physician: Daniel Nones Treating  Physician/Extender: Rudene Re in Treatment: 12 Encounter Discharge Information Items Discharge Pain Level: 0 Discharge Condition: Stable Ambulatory Status: Ambulatory Discharge Destination: Home Transportation: Private Auto Accompanied By: wife Schedule Follow-up Appointment: No Medication Reconciliation completed and provided to Patient/Care No Dasja Brase: Provided on Clinical Summary of Care: 10/04/2014 Form Type Recipient Paper Patient FP Electronic Signature(s) Signed: 10/04/2014 1:56:04 PM By: Elpidio Eric BSN, RN Previous Signature: 10/04/2014 1:53:07 PM Version By: Gwenlyn Perking Entered By: Elpidio Eric on 10/04/2014 13:56:04 [place], [name] ([id number]) -------------------------------------------------------------------------------- Lower Extremity Assessment Details Patient Name: Nicholas Leonard. Date of Service: 10/04/2014 1:15 PM Medical Record Number: [id number] Patient Account Number: [id number] Date of Birth/Sex: July 10, 1945 (69 y.o. Male) Treating RN: Clover Mealy, RN, BSN, [place] Sink Primary Care Physician: Daniel Nones Other Clinician: Referring Physician: Daniel Nones Treating Physician/Extender: Rudene Re in Treatment: 12 Edema Assessment Assessed: [Left: No] [Right: No] E[Left: dema] [Right: :] Calf Left: Right: Point of Measurement: 39 cm From Medial Instep cm 34.4 cm Ankle Left: Right: Point of Measurement: 12 cm From Medial Instep cm 24 cm Vascular Assessment Claudication: Claudication Assessment [Right:None] Pulses: Posterior Tibial Dorsalis Pedis Palpable: [Right:Yes] Extremity colors, hair growth, and conditions: Extremity Color: [Right:Mottled] Hair Growth on Extremity: [Right:No] Temperature of Extremity: [Right:Warm] Capillary Refill: [Right:< 3 seconds] Dependent Rubor: [Right:No] Blanched when Elevated: [Right:No] Lipodermatosclerosis: [Right:No] Toe Nail Assessment Left: Right: Thick: Yes Discolored: Yes Deformed:  No Improper Length and Hygiene: No RODY, KEADLE ([id number]) Electronic Signature(s) Signed: 10/04/2014 1:28:08 PM By: Elpidio Eric BSN, RN Entered By: Elpidio Eric on 10/04/2014 13:28:08 Nicholas Leonard ([id number]) -------------------------------------------------------------------------------- Multi Wound Chart Details Patient Name: Nicholas Leonard. Date of Service: 10/04/2014 1:15 PM Medical Record Number: [id number] Patient Account Number: [id number] Date of Birth/Sex: 03/08/1946 (69 y.o. 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] Comorbid History: [1:Congestive Heart Failure, N/A Coronary Artery Disease, Hypertension, Type II Diabetes, Gout, Neuropathy] Date Acquired: [1:06/10/2014] [N/A:N/A] Weeks of Treatment: [1:12] [N/A:N/A] Wound Status: [1:Open] [N/A:N/A] Measurements L x W x D 1x1.2x0.1 [N/A:N/A] (cm) Area (cm) : [1:0.942] [N/A:N/A] Volume (cm) : [1:0.094] [N/A:N/A] % Reduction in Area: [1:25.10%] [N/A:N/A] % Reduction in Volume: 25.40% [N/A:N/A] Classification: [1:Partial Thickness] [N/A:N/A] HBO Classification: [1:Grade 1] [N/A:N/A] Exudate Amount: [1:Medium] [N/A:N/A] Exudate Type: [1:Serous] [N/A:N/A] Exudate Color: [1:amber] [N/A:N/A] Wound Margin: [1:Distinct, outline attached N/A] Granulation Amount: [1:Large (67-100%)] [N/A:N/A] Granulation Quality: [1:Red,  Hyper-granulation] [N/A:N/A] Necrotic Amount: [1:None Present (0%)] [N/A:N/A] Exposed Structures: [N/A:N/A] Fascia: No Fat: No Tendon: No Muscle: No Joint: No Bone: No Limited to Skin Breakdown Epithelialization: Large (67-100%) N/A N/A Periwound Skin Texture: Edema: Yes N/A N/A Excoriation: No Induration: No Callus: No Crepitus: No Fluctuance: No Friable: No Rash: No Scarring: No Periwound Skin Moist: Yes N/A N/A Moisture: Maceration: No Dry/Scaly: No Periwound Skin Color: Atrophie Blanche: No N/A N/A Cyanosis: No Ecchymosis: No Erythema: No Hemosiderin Staining: No Mottled: No Pallor: No Rubor: No Temperature: No Abnormality N/A N/A Tenderness on Yes N/A N/A Palpation: Wound Preparation: Ulcer Cleansing: Other: N/A N/A soap and water Topical Anesthetic Applied: Other: lidocaine 4% Treatment Notes Electronic Signature(s) Signed: 10/04/2014 1:38:01 PM By: Elpidio Eric BSN, RN Entered By: Elpidio Eric on 10/04/2014 13:38:01 Nicholas Leonard ([id number]) -------------------------------------------------------------------------------- Multi-Disciplinary Care Plan Details Patient Name: Nicholas Leonard, Nicholas Leonard. Date of Service: 10/04/2014 1:15 PM Medical Record Number: [id number] Patient Account Number: [id number] Date of Birth/Sex: [birth year] (69 y.o. Male) Treating RN: Clover Mealy, RN, BSN, [place] Primary Care Physician: Daniel Nones Other Clinician: Referring Physician: Daniel Nones Treating Physician/Extender: Rudene Re in Treatment: 12 Active Inactive Nutrition Nursing Diagnoses: Impaired glucose control: actual or potential Potential for alteratiion in Nutrition/Potential for imbalanced nutrition Goals: Patient/caregiver verbalizes understanding of need to maintain therapeutic glucose control per primary care physician Date Initiated: 07/09/2014 Goal Status: Active Patient/caregiver will maintain therapeutic glucose control Date Initiated: 07/09/2014 Goal  Status: Active Interventions: Assess patient nutrition upon admission and as needed per policy Provide education on elevated blood sugars and impact on wound healing Provide education on nutrition Treatment Activities: Education provided on Nutrition : 07/09/2014 Notes: Orientation to the Wound Care Program Nursing Diagnoses: Knowledge deficit related to the wound healing center program Goals: Patient/caregiver will verbalize understanding of the Wound Healing Center Program Date Initiated: 07/09/2014 Goal Status: Active Interventions: Provide education on orientation to the wound center Nicholas Leonard, Nicholas Leonard. ([id number]) Notes: Venous Leg Ulcer Nursing Diagnoses: Knowledge deficit related to disease process and management Potential for venous Insuffiency (use before diagnosis confirmed) Goals: Patient will maintain optimal edema control Date Initiated: 07/09/2014 Goal Status: Active Verify adequate tissue perfusion prior to therapeutic compression application Date Initiated: 07/09/2014 Goal Status: Active Interventions: Assess peripheral edema status every visit. Compression as ordered Provide education on venous insufficiency Treatment Activities: Test ordered outside of clinic : 10/04/2014 Therapeutic compression applied : 10/04/2014 Notes: Wound/Skin Impairment Nursing Diagnoses: Knowledge deficit related to ulceration/compromised skin integrity Goals: Patient/caregiver will verbalize understanding of skin care regimen Date Initiated: 07/09/2014 Goal Status:  Active Ulcer/skin breakdown will heal within 14 weeks Date Initiated: 07/09/2014 Goal Status: Active Interventions: Assess patient/caregiver ability to obtain necessary supplies Assess patient/caregiver ability to perform ulcer/skin care regimen upon admission and as needed Assess ulceration(s) every visit Provide education on ulcer and skin care Nicholas Leonard, Nicholas Leonard ([id number]) Treatment Activities: Patient referred  to home care : 10/04/2014 Skin care regimen initiated : 10/04/2014 Topical wound management initiated : 10/04/2014 Notes: Electronic Signature(s) Signed: 10/04/2014 1:37:52 PM By: Elpidio Eric BSN, RN Entered By: Elpidio Eric on 10/04/2014 13:37:52 [place], [name] ([id number]) -------------------------------------------------------------------------------- Pain Assessment Details Patient Name: Nicholas Leonard. Date of Service: 10/04/2014 1:15 PM Medical Record Number: [id number] Patient Account Number: [id number] Date of Birth/Sex: January 17, 1946 (69 y.o. Male) Treating RN: Clover Mealy, RN, BSN, [place] Sink Primary Care Physician: Daniel Nones Other Clinician: Referring Physician: Daniel Nones Treating Physician/Extender: Rudene Re in Treatment: 12 Active Problems Location of Pain Severity and Description of Pain Patient Has Paino No Site Locations Pain Management and Medication Current Pain Management: Electronic Signature(s) Signed: 10/04/2014 1:26:33 PM By: Elpidio Eric BSN, RN Entered By: Elpidio Eric on 10/04/2014 13:26:32 Nicholas Leonard ([id number]) -------------------------------------------------------------------------------- Patient/Caregiver Education Details Patient Name: Nicholas Leonard, Nicholas Leonard. Date of Service: 10/04/2014 1:15 PM Medical Record Number: [id number] Patient Account Number: [id number] Date of Birth/Gender: [birth year] (69 y.o. Male) Treating RN: Clover Mealy, RN, BSN, [place] Sink Primary Care Physician: Daniel Nones Other Clinician: Referring Physician: Daniel Nones Treating Physician/Extender: Rudene Re in Treatment: 12 Education Assessment Education Provided To: Patient Education Topics Provided Basic Hygiene: Methods: Explain/Verbal Responses: State content correctly Wound/Skin Impairment: Methods: Explain/Verbal Responses: State content correctly Electronic Signature(s) Signed: 10/04/2014 1:56:19 PM By: Elpidio Eric BSN, RN Entered By: Elpidio Eric on 10/04/2014  13:56:19 Nicholas Leonard ([id number]) -------------------------------------------------------------------------------- Wound Assessment Details Patient Name: Nicholas Leonard, Nicholas Leonard. Date of Service: 10/04/2014 1:15 PM Medical Record Number: [id number] Patient Account Number: [id number] Date of Birth/Sex: [birth year] (69 y.o. Male) Treating RN: Clover Mealy, RN, BSN, [place] Primary Care Physician: Daniel Nones Other Clinician: Referring Physician: Daniel Nones Treating Physician/Extender: Rudene Re in Treatment: 12 Wound Status Wound Number: 1 Primary Arterial Insufficiency Ulcer Etiology: Wound Location: Right Lower Leg - Medial Secondary Diabetic Wound/Ulcer of the Lower Wounding Event: Gradually Appeared Etiology: Extremity Date Acquired: 06/10/2014 Wound Open Weeks Of Treatment: 12 Status: Clustered Wound: No Comorbid Congestive Heart Failure, Coronary History: Artery Disease, Hypertension, Type II Diabetes, Gout, Neuropathy Photos Photo Uploaded By: Elpidio Eric on 10/04/2014 15:14:11 Wound Measurements Length: (cm) 1 Width: (cm) 1.2 Depth: (cm) 0.1 Area: (cm) 0.942 Volume: (cm) 0.094 % Reduction in Area: 25.1% % Reduction in Volume: 25.4% Epithelialization: Large (67-100%) Tunneling: No Undermining: No Wound Description Classification: Partial Thickness Foul O Diabetic Severity (Wagner): Grade 1 Wound Margin: Distinct, outline attached Exudate Amount: Medium Exudate Type: Serous Exudate Color: amber dor After Cleansing: No Wound Bed Granulation Amount: Large (67-100%) Exposed Structure Swallows, Braidon E. ([id number]) Granulation Quality: Red, Hyper-granulation Fascia Exposed: No Necrotic Amount: None Present (0%) Fat Layer Exposed: No Tendon Exposed: No Muscle Exposed: No Joint Exposed: No Bone Exposed: No Limited to Skin Breakdown Periwound Skin Texture Texture Color No Abnormalities Noted: No No Abnormalities Noted: No Callus: No Atrophie Blanche:  No Crepitus: No Cyanosis: No Excoriation: No Ecchymosis: No Fluctuance: No Erythema: No Friable: No Hemosiderin Staining: No Induration: No Mottled: No Localized Edema: Yes Pallor: No Rash: No Rubor: No Scarring: No Temperature / Pain Moisture Temperature: No Abnormality No Abnormalities Noted: No Tenderness on Palpation: Yes Dry / Scaly: No Maceration: No Moist: Yes  Wound Preparation Ulcer Cleansing: Other: soap and water, Topical Anesthetic Applied: Other: lidocaine 4%, Treatment Notes Wound #1 (Right, Medial Lower Leg) 1. Cleansed with: Cleanse wound with antibacterial soap and water 3. Peri-wound Care: Moisturizing lotion 4. Dressing Applied: Mepitel 5. Secondary Dressing Applied ABD Pad 7. Secured with 2 Layer Lite Compression System - Right Lower Extremity Electronic Signature(s) Signed: 10/04/2014 1:34:20 PM By: Elpidio Eric BSN, RN Entered By: Elpidio Eric on 10/04/2014 13:34:20 Nicholas Leonard, Nicholas Leonard ([id number]) [name], [name] ([id number]) -------------------------------------------------------------------------------- Vitals Details Patient Name: Nicholas Leonard. Date of Service: 10/04/2014 1:15 PM Medical Record Number: [id number] Patient Account Number: [id number] Date of Birth/Sex: [birth year] (69 y.o. Male) Treating RN: Clover Mealy, RN, BSN, [place] Primary Care Physician: Daniel Nones Other Clinician: Referring Physician: Daniel Nones Treating Physician/Extender: Rudene Re in Treatment: 12 Vital Signs Time Taken: 13:26 Temperature (F): 97.6 Height (in): 72 Pulse (bpm): 69 Weight (lbs): 215 Respiratory Rate (breaths/min): 16 Body Mass Index (BMI): 29.2 Blood Pressure (mmHg): 98/59 Reference Range: 80 - 120 mg / dl Electronic Signature(s) Signed: 10/04/2014 1:27:09 PM By: Elpidio Eric BSN, RN Entered By: Elpidio Eric on 10/04/2014 13:27:09

## 2014-10-09 DIAGNOSIS — E118 Type 2 diabetes mellitus with unspecified complications: Secondary | ICD-10-CM | POA: Insufficient documentation

## 2014-10-09 DIAGNOSIS — N183 Chronic kidney disease, stage 3 unspecified: Secondary | ICD-10-CM | POA: Insufficient documentation

## 2014-10-10 ENCOUNTER — Encounter: Payer: Medicare Other | Admitting: Surgery

## 2014-10-10 DIAGNOSIS — L97919 Non-pressure chronic ulcer of unspecified part of right lower leg with unspecified severity: Secondary | ICD-10-CM | POA: Diagnosis not present

## 2014-10-11 NOTE — Progress Notes (Signed)
DOMONICK, SITTNER ([id number]) Visit Report for 10/10/2014 Arrival Information Details Patient Name: Nicholas Leonard, Nicholas Leonard. Date of Service: 10/10/2014 2:15 PM Medical Record Number: [id number] Patient Account Number: [id number] Date of Birth/Sex: 1945-05-10 (69 y.o. Male) Treating RN: Clover Mealy, RN, BSN, [place] Sink Primary Care Physician: Daniel Nones Other Clinician: Referring Physician: Daniel Nones Treating Physician/Extender: Rudene Re in Treatment: 13 Visit Information History Since Last Visit Any new allergies or adverse reactions: No Patient Arrived: Ambulatory Had a fall or experienced change in No Arrival Time: 14:38 activities of daily living that may affect Accompanied By: self risk of falls: Transfer Assistance: None Signs or symptoms of abuse/neglect since last No Patient Identification Verified: Yes visito Secondary Verification Process Yes Hospitalized since last visit: No Completed: Has Dressing in Place as Prescribed: Yes Patient Requires Transmission- No Has Compression in Place as Prescribed: Yes Based Precautions: Pain Present Now: No Patient Has Alerts: Yes Patient Alerts: Patient on Blood Thinner coumadin DMII ABI L 1.75 R 1.55 Electronic Signature(s) Signed: 10/10/2014 4:58:02 PM By: Elpidio Eric BSN, RN Entered By: Elpidio Eric on 10/10/2014 14:38:56 Nicholas Leonard ([id number]) -------------------------------------------------------------------------------- Encounter Discharge Information Details Patient Name: Nicholas Leonard. Date of Service: 10/10/2014 2:15 PM Medical Record Number: [id number] Patient Account Number: [id number] Date of Birth/Sex: February 26, 1946 (69 y.o. Male) Treating RN: Clover Mealy, RN, BSN, [place] Sink Primary Care Physician: Daniel Nones Other Clinician: Referring Physician: Daniel Nones Treating Physician/Extender: Rudene Re in Treatment: 68 Encounter Discharge Information Items Discharge Pain Level: 0 Discharge Condition:  Stable Ambulatory Status: Ambulatory Discharge Destination: Home Private Transportation: Auto Accompanied By: self Schedule Follow-up Appointment: No Medication Reconciliation completed and No provided to Patient/Care Corrina Steffensen: Clinical Summary of Care: Electronic Signature(s) Signed: 10/10/2014 4:58:02 PM By: Elpidio Eric BSN, RN Entered By: Elpidio Eric on 10/10/2014 15:07:52 Nicholas Leonard ([id number]) -------------------------------------------------------------------------------- Lower Extremity Assessment Details Patient Name: Nicholas Leonard. Date of Service: 10/10/2014 2:15 PM Medical Record Number: [id number] Patient Account Number: [id number] Date of Birth/Sex: 11/17/1945 (69 y.o. Male) Treating RN: Clover Mealy, RN, BSN, [place] Sink Primary Care Physician: Daniel Nones Other Clinician: Referring Physician: Daniel Nones Treating Physician/Extender: Rudene Re in Treatment: 13 Edema Assessment Assessed: [Left: No] [Right: No] E[Left: dema] [Right: :] Calf Left: Right: Point of Measurement: 39 cm From Medial Instep cm 36 cm Ankle Left: Right: Point of Measurement: 12 cm From Medial Instep cm 23 cm Vascular Assessment Claudication: Claudication Assessment [Right:None] Pulses: Posterior Tibial Dorsalis Pedis Palpable: [Right:No] Doppler: [Right:Monophasic] Extremity colors, hair growth, and conditions: Extremity Color: [Right:Normal] Hair Growth on Extremity: [Right:No] Temperature of Extremity: [Right:Warm] Capillary Refill: [Right:< 3 seconds] Dependent Rubor: [Right:No] Blanched when Elevated: [Right:No] Lipodermatosclerosis: [Right:No] Toe Nail Assessment Left: Right: Thick: Yes Discolored: Yes Deformed: No Improper Length and Hygiene: No LORD, LANCOUR ([id number]) Electronic Signature(s) Signed: 10/10/2014 4:58:02 PM By: Elpidio Eric BSN, RN Entered By: Elpidio Eric on 10/10/2014 14:43:58 Kerney, [name]  ([id number]) -------------------------------------------------------------------------------- Multi Wound Chart Details Patient Name: Nicholas Leonard. Date of Service: 10/10/2014 2:15 PM Medical Record Number: [id number] Patient Account Number: [id number] Date of Birth/Sex: 1945-05-01 (69 y.o. 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] Comorbid History: [1:Congestive Heart Failure, N/A Coronary Artery Disease, Hypertension, Type II Diabetes, Gout, Neuropathy] Date Acquired: [1:06/10/2014] [N/A:N/A] Weeks of Treatment: [1:13] [N/A:N/A] Wound Status: [1:Open] [N/A:N/A] Measurements L x W x D 1.3x1.4x0.1 [N/A:N/A] (cm) Area (cm) : [1:1.429] [N/A:N/A] Volume (cm) : [1:0.143] [N/A:N/A] % Reduction in Area: [1:-13.70%] [N/A:N/A] % Reduction in Volume: -13.50% [N/A:N/A] Classification: [1:Partial Thickness] [N/A:N/A] HBO Classification: [1:Grade 1] [N/A:N/A] Exudate Amount: [1:Medium] [N/A:N/A] Exudate Type: [1:Serous] [N/A:N/A] Exudate Color: [1:amber] [N/A:N/A] Wound Margin: [1:Distinct, outline attached N/A] Granulation Amount: [1:Large (67-100%)] [N/A:N/A] Granulation Quality: [1:Red, Hyper-granulation] [N/A:N/A] Necrotic Amount: [1:None Present (0%)] [N/A:N/A] Exposed Structures: [N/A:N/A] Fascia: No Fat: No Tendon: No Muscle: No Joint: No Bone: No Limited to Skin Breakdown Epithelialization:  Large (67-100%) N/A N/A Periwound Skin Texture: Edema: Yes N/A N/A Excoriation: No Induration: No Callus: No Crepitus: No Fluctuance: No Friable: No Rash: No Scarring: No Periwound Skin Moist: Yes N/A N/A Moisture: Maceration: No Dry/Scaly: No Periwound Skin Color: Atrophie Blanche: No N/A N/A Cyanosis: No Ecchymosis: No Erythema: No Hemosiderin Staining: No Mottled: No Pallor: No Rubor: No Temperature: No Abnormality N/A N/A Tenderness on Yes N/A N/A Palpation: Wound Preparation: Ulcer Cleansing: Other: N/A N/A soap and water Topical Anesthetic Applied: Other: lidocaine 4% Treatment Notes Electronic Signature(s) Signed: 10/10/2014 4:58:02 PM By: Elpidio Eric BSN, RN Entered By: Elpidio Eric on 10/10/2014 14:58:16 Nicholas Leonard ([id number]) -------------------------------------------------------------------------------- Multi-Disciplinary Care Plan Details Patient Name: ERIEL, DOYON. Date of Service: 10/10/2014 2:15 PM Medical Record Number: [id number] Patient Account Number: [id number] Date of Birth/Sex: [birth year] (70 y.o. Male) Treating RN: Clover Mealy, RN, BSN, [place] Sink Primary Care Physician: Daniel Nones Other Clinician: Referring Physician: Daniel Nones Treating Physician/Extender: Rudene Re in Treatment: 13 Active Inactive Nutrition Nursing Diagnoses: Impaired glucose control: actual or potential Potential for alteratiion in Nutrition/Potential for imbalanced nutrition Goals: Patient/caregiver verbalizes understanding of need to maintain therapeutic glucose control per primary care physician Date Initiated: 07/09/2014 Goal Status: Active Patient/caregiver will maintain therapeutic glucose control Date Initiated: 07/09/2014 Goal Status: Active Interventions: Assess patient nutrition upon admission and as needed per policy Provide education on elevated blood sugars and impact on wound healing Provide education on nutrition Treatment  Activities: Education provided on Nutrition : 07/09/2014 Notes: Orientation to the Wound Care Program Nursing Diagnoses: Knowledge deficit related to the wound healing center program Goals: Patient/caregiver will verbalize understanding of the Wound Healing Center Program Date Initiated: 07/09/2014 Goal Status: Active Interventions: Provide education on orientation to the wound center HIRAN, LEARD. ([id number]) Notes: Venous Leg Ulcer Nursing Diagnoses: Knowledge deficit related to disease process and management Potential for venous Insuffiency (use before diagnosis confirmed) Goals: Patient will maintain optimal edema control Date Initiated: 07/09/2014 Goal Status: Active Verify adequate tissue perfusion prior to therapeutic compression application Date Initiated: 07/09/2014 Goal Status: Active Interventions: Assess peripheral edema status every visit. Compression as ordered Provide education on venous insufficiency Treatment Activities: Test ordered outside of clinic : 10/10/2014 Therapeutic compression applied : 10/10/2014 Notes: Wound/Skin Impairment Nursing Diagnoses: Knowledge deficit related to ulceration/compromised skin integrity Goals: Patient/caregiver will verbalize understanding of skin care regimen Date Initiated: 07/09/2014 Goal Status: Active Ulcer/skin breakdown will heal within 14 weeks Date Initiated: 07/09/2014 Goal Status: Active Interventions: Assess patient/caregiver ability to obtain necessary supplies Assess patient/caregiver ability to perform ulcer/skin care regimen upon admission and as needed Assess ulceration(s) every visit Provide education on ulcer and skin care VEARL, ALLBAUGH ([id number]) Treatment Activities: Patient referred to home care : 10/10/2014 Skin care regimen initiated :  10/10/2014 Topical wound management initiated : 10/10/2014 Notes: Electronic Signature(s) Signed: 10/10/2014 4:58:02 PM By: Elpidio Eric BSN, RN Entered  By: Elpidio Eric on 10/10/2014 14:52:44 Nicholas Leonard ([id number]) -------------------------------------------------------------------------------- Pain Assessment Details Patient Name: MAYFIELD, SCHOENE. Date of Service: 10/10/2014 2:15 PM Medical Record Number: [id number] Patient Account Number: [id number] Date of Birth/Sex: 1945/08/23 (69 y.o. Male) Treating RN: Clover Mealy, RN, BSN, [place] Sink Primary Care Physician: Daniel Nones Other Clinician: Referring Physician: Daniel Nones Treating Physician/Extender: Rudene Re in Treatment: 13 Active Problems Location of Pain Severity and Description of Pain Patient Has Paino No Site Locations Pain Management and Medication Current Pain Management: Electronic Signature(s) Signed: 10/10/2014 4:58:02 PM By: Elpidio Eric BSN, RN Entered By: Elpidio Eric on 10/10/2014 14:39:07 Nicholas Leonard ([id number]) -------------------------------------------------------------------------------- Patient/Caregiver Education Details Patient Name: ASHE, GAGO. Date of Service: 10/10/2014 2:15 PM Medical Record Number: [id number] Patient Account Number: [id number] Date of Birth/Gender: 12-May-1945 (69 y.o. Male) Treating RN: Clover Mealy, RN, BSN, [place] Sink Primary Care Physician: Daniel Nones Other Clinician: Referring Physician: Daniel Nones Treating Physician/Extender: Rudene Re in Treatment: 13 Education Assessment Education Provided To: Patient Education Topics Provided Elevated Blood Sugar/ Impact on Healing: Methods: Explain/Verbal Responses: State content correctly Nutrition: Handouts: Elevated Blood Sugars: How Do They Affect Wound Healing Responses: State content correctly Venous: Handouts: Controlling Swelling with Multilayered Compression Wraps Methods: Explain/Verbal Responses: State content correctly Welcome To The Wound Care Center: Methods: Explain/Verbal Responses: State content correctly Wound/Skin Impairment: Methods:  Explain/Verbal Responses: State content correctly Electronic Signature(s) Signed: 10/10/2014 4:58:02 PM By: Elpidio Eric BSN, RN Entered By: Elpidio Eric on 10/10/2014 15:08:37 Irani, [name] ([id number]) -------------------------------------------------------------------------------- Wound Assessment Details Patient Name: Nicholas Leonard. Date of Service: 10/10/2014 2:15 PM Medical Record Number: [id number] Patient Account Number: [id number] Date of Birth/Sex: Jan 02, 1946 (69 y.o. Male) Treating RN: Clover Mealy, RN, BSN, Rita Primary Care Physician: Daniel Nones Other Clinician: Referring Physician: Daniel Nones Treating Physician/Extender: Rudene Re in Treatment: 13 Wound Status Wound Number: 1 Primary Arterial Insufficiency Ulcer Etiology: Wound Location: Right Lower Leg - Medial Secondary Diabetic Wound/Ulcer of the Lower Wounding Event: Gradually Appeared Etiology: Extremity Date Acquired: 06/10/2014 Wound Open Weeks Of Treatment: 13 Status: Clustered Wound: No Comorbid Congestive Heart Failure, Coronary History: Artery Disease, Hypertension, Type II Diabetes, Gout, Neuropathy Photos Photo Uploaded By: Elpidio Eric on 10/10/2014 16:55:48 Wound Measurements Length: (cm) 1.3 Width: (cm) 1.4 Depth: (cm) 0.1 Area: (cm) 1.429 Volume: (cm) 0.143 % Reduction in Area: -13.7% % Reduction in Volume: -13.5% Epithelialization: Large (67-100%) Tunneling: No Undermining: No Wound Description Classification: Partial Thickness Foul O Diabetic Severity (Wagner): Grade 1 Wound Margin: Distinct, outline attached Exudate Amount: Medium Exudate Type: Serous Exudate Color: amber dor After Cleansing: No Wound Bed Granulation Amount: Large (67-100%) Exposed Structure Tango, Cheron E. ([id number]) Granulation Quality: Red, Hyper-granulation Fascia Exposed: No Necrotic Amount: None Present (0%) Fat Layer Exposed: No Tendon Exposed: No Muscle Exposed: No Joint Exposed:  No Bone Exposed: No Limited to Skin Breakdown Periwound Skin Texture Texture Color No Abnormalities Noted: No No Abnormalities Noted: No Callus: No Atrophie Blanche: No Crepitus: No Cyanosis: No Excoriation: No Ecchymosis: No Fluctuance: No Erythema: No Friable: No Hemosiderin Staining: No Induration: No Mottled: No Localized Edema: Yes Pallor: No Rash: No Rubor: No Scarring: No Temperature / Pain Moisture Temperature: No Abnormality No Abnormalities Noted: No Tenderness on Palpation: Yes Dry / Scaly: No Maceration: No Moist: Yes Wound Preparation Ulcer Cleansing: Other: soap and water, Topical Anesthetic Applied: Other: lidocaine 4%, Treatment Notes Wound #1 (Right,  Medial Lower Leg) 1. Cleansed with: Cleanse wound with antibacterial soap and water 3. Peri-wound Care: Moisturizing lotion 4. Dressing Applied: Other dressing (specify in notes) 5. Secondary Dressing Applied ABD Pad 7. Secured with 2 Layer Lite Compression System - Right Lower Extremity Electronic Signature(s) Signed: 10/10/2014 4:58:02 PM By: Elpidio EricAfful, Rita BSN, RN Entered By: Elpidio EricAfful, Rita on 10/10/2014 14:48:29 Nicholas LedgerRKER, Blue E. ([id number]) Jimmey RalphPARKER, Beatrix FettersFREDDIE E. ([id number]) -------------------------------------------------------------------------------- Vitals Details Patient Name: Nicholas LedgerPARKER, Deldrick E. Date of Service: 10/10/2014 2:15 PM Medical Record Number: [id number] Patient Account Number: [id number] Date of Birth/Sex: 04/25/1945 [age] y.o. Male) Treating RN: Clover MealyAfful, RN, BSN, Rita Primary Care Physician: Daniel NonesKLEIN, BERT Other Clinician: Referring Physician: Daniel NonesKLEIN, BERT Treating Physician/Extender: Rudene ReBritto, Errol Weeks in Treatment: 13 Vital Signs Time Taken: 14:39 Temperature (F): 98.6 Height (in): 72 Pulse (bpm): 70 Weight (lbs): 215 Respiratory Rate (breaths/min): 16 Body Mass Index (BMI): 29.2 Blood Pressure (mmHg): 98/60 Reference Range: 80 - 120 mg / dl Electronic  Signature(s) Signed: 10/10/2014 4:58:02 PM By: Elpidio EricAfful, Rita BSN, RN Entered By: Elpidio EricAfful, Rita on 10/10/2014 14:40:56

## 2014-10-11 NOTE — Progress Notes (Signed)
LORING, LISKEY ([id number]) Visit Report for 10/10/2014 Chief Complaint Document Details Patient Name: Nicholas Leonard, Nicholas Leonard. Date of Service: 10/10/2014 2:15 PM Medical Record Number: [id number] Patient Account Number: [id number] Date of Birth/Sex: [birth year] (69 y.o. Male) Treating RN: Primary Care Physician: Daniel Nones Other Clinician: Referring Physician: Daniel Nones Treating Physician/Extender: Rudene Re in Treatment: 13 Information Obtained from: Patient Chief Complaint Patient presents to the wound care center for a consult due non healing wound ulcer on the right lower limb which was noted to be there for about 3 weeks now. he is also had swelling of both lower extremities for about 2 months now. Electronic Signature(s) Signed: 10/10/2014 4:41:39 PM By: Evlyn Kanner MD, FACS Entered By: Evlyn Kanner on 10/10/2014 15:17:18 Nicholas Leonard ([id number]) -------------------------------------------------------------------------------- Cellular or Tissue Based Product Details Patient Name: Nicholas Leonard, Nicholas Leonard. Date of Service: 10/10/2014 2:15 PM Medical Record Number: [id number] Patient Account Number: [id number] Date of Birth/Sex: 11/26/1945 (69 y.o. Male) Treating RN: Primary Care Physician: Daniel Nones Other Clinician: Referring Physician: Daniel Nones Treating Physician/Extender: Rudene Re in Treatment: 13 Cellular or Tissue Based Wound #1 Right,Medial Lower Leg Product Type Applied to: Performed By: Physician Tristan Schroeder., MD Cellular or Tissue Based Epifix Product Type: Time-Out Taken: Yes Location: trunk / arms / legs Wound Size (sq cm): 1.82 Product Size (sq cm): 1.4 Waste Size (sq cm): 0 Amount of Product Applied (sq cm): 1.4 Lot #: [id number] Expiration Date: 04/15/2019 Fenestrated: No Reconstituted: Yes Solution Type: normal saline Solution Amount: 1ml Lot #: [id number] Solution Expiration 07/11/2016 Date: Secured: Yes Secured With:  Steri-Strips Dressing Applied: Yes Primary Dressing: adaptic/mepitel Procedural Pain: 0 Post Procedural Pain: 0 Response to Treatment: Procedure was tolerated well Electronic Signature(s) Signed: 10/10/2014 4:41:39 PM By: Evlyn Kanner MD, FACS Entered By: Evlyn Kanner on 10/10/2014 15:17:07 Nicholas Leonard ([id number]) -------------------------------------------------------------------------------- HPI Details Patient Name: Nicholas Leonard. Date of Service: 10/10/2014 2:15 PM Medical Record Number: [id number] Patient Account Number: [id number] Date of Birth/Sex: [birth year] (69 y.o. Male) Treating RN: Primary Care Physician: Daniel Nones Other Clinician: Referring Physician: Daniel Nones Treating Physician/Extender: Rudene Re in Treatment: 13 History of Present Illness HPI Description: 69 year old gentleman who is recently discharged from [facility] and was admitted between 3 18-3/22 by his primary care physician Dr. Daniel Nones. He has adult onset diabetes and was recently admitted for acute on chronic left-sided systolic congestive heart failure, acute on chronic renal failure, coronary artery disease, ischemic cardiomyopathy, ventricular fibrillation status post AICD implantation, history of atrial fibrillation on Coumadin therapy, hypothyroidism, history of venous stasis ulcer, pulmonary embolism with deep vein thrombosis on chronic Coumadin therapy, osteoarthritis and gout. He does not smoke any longer and occasionally has alcohol. as not been compliant with getting his blood sugars checked and does not know the last time he had a hemoglobin A1c done. Does not recall when the last ultrasound of his lower extremities was done but he does say that he has had clots in his legs. 07/30/2014 the patient is back to [place] after a recent admission and discharge from the hospital and he was there between 07/19/2014 and 07/26/2014. Reviewed these notes and his final  diagnosis was #1 elevated INR, #2 acute on chronic left-sided systolic congestive heart failure, #3 hypothyroidism, #4 gout, #5 chronic kidney disease stage III, #6 acute renal failure, #7 carotid artery disease, #8 history of pulmonary embolism and DVT, #9 adult onset diabetes mellitus. During the admission he was corrected with giving vitamin K and  due to his difficulty with Coumadin he was changed over to Eliquis. The patient also was found to have diffuse edema and there was evidence of ascites on abdominal ultrasound and was placed on a Lasix drip by nephrologist. His just discharge medications were noted by me. they have been using Santyl on his right lower extremity wound and he has been doing this dressing as often as the nurse comes in. he has also developed a ulcer of the left thigh and this has been there for about 2 weeks and has some necrosis of skin over there. 08/23/2014 -- he recently saw the vascular surgeon Dr. Gilda Crease and has had both an arterial and duplex venous study done. Though we do not have the official report yet the patient tells me that his arteries were okay and there was no problem with the blood flow. 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 08/30/2014 -- his vascular surgery has been scheduled for Tuesday, May 24 and he will come back and see [place] if he needs to soon after that for a rewrap. Addendum: We have finally received his transcript of his office visit with Dr. Gilda Crease on 08/22/2014 and note that the duplex US of the lower extremity arterial system bilaterally SFA are patent, with diffuse tibial disease with monophasic tibial signals on the right and biphasic signals on the left. Duplex Venous ultrasound of the lower extremity shows normal deep venous system, superficial reflux is present over a short segment of the GSV in the region of the ulcer. However the GSV from  the knees to the groin is competent. The recommendations were for the patient to have angiography of the lower extremity with the hope of ASHAD, FAWBUSH. ([id number]) intervention for limb salvage. Once the arterial compromise has been addressed then the venous portion can be more effectively treated. 09/05/2014 - s/p RLE angiogram without intervention 09/03/2014 by Dr Gilda Crease. No new complaints today. No significant pain. No fever or chills. Minimal drainage. 09/13/2014 -- On 09/03/2014 o Procedure(s) Performed: 1. Abdominal aortogram 2. Right lower extremity distal runoff third order catheter placement 3. Additional right third order catheter placement as per the patient the vascular surgeon will see him back in 6 months and no other procedures planned for in the near future. 09/27/2014 -- He is here for his first application of a EpiFix which was approved by his insurance company. 10/10/2014 -- easier for his second application of a Epifix. He is doing fine and has no complaints Electronic Signature(s) Signed: 10/10/2014 4:41:39 PM By: Evlyn Kanner MD, FACS Entered By: Evlyn Kanner on 10/10/2014 15:18:06 Nicholas Leonard ([id number]) -------------------------------------------------------------------------------- Physical Exam Details Patient Name: Nicholas Leonard, Nicholas Leonard. Date of Service: 10/10/2014 2:15 PM Medical Record Number: [id number] Patient Account Number: [id number] Date of Birth/Sex: 01/07/1946 (69 y.o. Male) Treating RN: Primary Care Physician: Daniel Nones Other Clinician: Referring Physician: Daniel Nones Treating Physician/Extender: Rudene Re in Treatment: 13 Constitutional . Pulse regular. Respirations normal and unlabored. Afebrile. . Eyes Nonicteric. Reactive to light. Ears, Nose, Mouth, and Throat Lips, teeth, and gums WNL.Marland Kitchen Moist mucosa without lesions . Neck supple and nontender. No palpable supraclavicular or cervical adenopathy. Normal sized without  goiter. Respiratory WNL. No retractions.. Cardiovascular Pedal Pulses WNL. No clubbing, cyanosis or edema. Musculoskeletal Adexa without tenderness or enlargement.. Digits and nails w/o clubbing, cyanosis, infection, petechiae, ischemia, or inflammatory conditions.. Integumentary (Hair, Skin) No suspicious lesions. No crepitus or  fluctuance. No peri-wound warmth or erythema. No masses.Marland Kitchen Psychiatric Judgement and insight Intact.. No evidence of depression, anxiety, or agitation.. Notes The wound on his right lower extremity looks very clean and good and there is some epithelization from the sides. Electronic Signature(s) Signed: 10/10/2014 4:41:39 PM By: Evlyn Kanner MD, FACS Entered By: Evlyn Kanner on 10/10/2014 15:18:47 Nicholas Leonard ([id number]) -------------------------------------------------------------------------------- Physician Orders Details Patient Name: TEOFILO, LUPINACCI. Date of Service: 10/10/2014 2:15 PM Medical Record Number: [id number] Patient Account Number: [id number] Date of Birth/Sex: [birth year] (69 y.o. Male) Treating RN: Clover Mealy, RN, BSN, [place] Primary Care Physician: Daniel Nones Other Clinician: Referring Physician: Daniel Nones Treating Physician/Extender: Rudene Re in Treatment: 96 Verbal / Phone Orders: Yes Clinician: Afful, RN, BSN, Rita Read Back and Verified: Yes Diagnosis Coding Wound Cleansing Wound #1 Right,Medial Lower Leg o May shower with protection. o No tub bath. Anesthetic Wound #1 Right,Medial Lower Leg o Topical Lidocaine 4% cream applied to wound bed prior to debridement Primary Wound Dressing Wound #1 Right,Medial Lower Leg o Contact layer o Other: - epifix applied by Dr Meyer Russel in clinic Secondary Dressing Wound #1 Right,Medial Lower Leg o ABD pad Dressing Change Frequency Wound #1 Right,Medial Lower Leg o Change dressing every week Follow-up Appointments Wound #1 Right,Medial Lower Leg o Return  Appointment in 1 week. Edema Control Wound #1 Right,Medial Lower Leg o 2 Layer Lite Compression System - Right Lower Extremity Advanced Therapies Wound #1 Right,Medial Lower Leg o EpiFix application in clinic; including contact layer, fixation with steri strips, dry gauze and cover dressing. TRAVAUGHN, VUE ([id number]) Notes order epifix #3 Electronic Signature(s) Signed: 10/10/2014 3:00:04 PM By: Elpidio Eric BSN, RN Signed: 10/10/2014 4:41:39 PM By: Evlyn Kanner MD, FACS Entered By: Elpidio Eric on 10/10/2014 15:00:04 Nicholas Leonard ([id number]) -------------------------------------------------------------------------------- Problem List Details Patient Name: Nicholas Leonard, Nicholas Leonard. Date of Service: 10/10/2014 2:15 PM Medical Record Number: [id number] Patient Account Number: [id number] Date of Birth/Sex: [birth year] (69 y.o. 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 Date of Service: 10/10/2014 2:15 PM Medical Record Number: [id number] Patient Account Number: [id number] Date of Birth/Sex: 1945-08-02 (69 y.o. Male) Treating RN: Primary Care Physician: Daniel Nones Other Clinician: Referring Physician: Daniel Nones Treating Physician/Extender: Rudene Re in Treatment: 13 Subjective Chief Complaint Information obtained from Patient Patient presents to the wound care center for a consult due non healing wound ulcer on the right lower limb which was noted to be there for about 3 weeks now. he is also had swelling of both lower extremities for about 2 months now. History of Present Illness (HPI) 69 year old gentleman who is recently discharged from [facility]  and was admitted between 3 18-3/22 by his primary care physician Dr. Daniel Nones. He has adult onset diabetes and was recently admitted for acute on chronic left-sided systolic congestive heart failure, acute on chronic renal failure, coronary artery disease, ischemic cardiomyopathy, ventricular fibrillation status post AICD implantation, history of atrial fibrillation on Coumadin therapy, hypothyroidism, history of venous stasis ulcer, pulmonary embolism with deep vein thrombosis on chronic Coumadin therapy, osteoarthritis and gout. He does not smoke any longer and occasionally has alcohol. as not been compliant with getting his blood sugars checked and does not know the last time he had a hemoglobin A1c done. Does not recall when the last ultrasound of his lower extremities was done but he does say that he has had clots in his legs. 07/30/2014 the patient is back to [place] after a recent admission and discharge from the hospital and he was there between 07/19/2014 and 07/26/2014. Reviewed these notes and his final diagnosis was #1  elevated INR, #2 acute on chronic left-sided systolic congestive heart failure, #3 hypothyroidism, #4 gout, #5 chronic kidney disease stage III, #6 acute renal failure, #7 carotid artery disease, #8 history of pulmonary embolism and DVT, #9 adult onset diabetes mellitus. During the admission he was corrected with giving vitamin K and due to his difficulty with Coumadin he was changed over to Eliquis. The patient also was found to have diffuse edema and there was evidence of ascites on abdominal ultrasound and was placed on a Lasix drip by nephrologist. His just discharge medications were noted by me. they have been using Santyl on his right lower extremity wound and he has been doing this dressing as often as the nurse comes in. he has also developed a ulcer of the left thigh and this has been there for about 2 weeks and has some necrosis of skin over there. 08/23/2014 -- he recently saw the vascular surgeon Dr. Gilda Crease and has had both an arterial and duplex venous study done. Though we do not have the official report yet the patient tells me that his arteries were okay and there was no problem with the blood flow. 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 08/30/2014 -- his vascular surgery has been scheduled for Tuesday, May 24 and he will come back and see TYBERIUS, RYNER ([id number]) [place] if he needs to soon after that for a rewrap. Addendum: We have finally received his transcript of his office visit with Dr. Gilda Crease on 08/22/2014 and note that the duplex US of the lower extremity arterial system bilaterally SFA are patent, with diffuse tibial disease with monophasic tibial signals on the right and biphasic signals on the left. Duplex Venous ultrasound of the lower extremity shows normal deep venous system, superficial reflux is present over a short segment of the GSV in the region of the ulcer.  However the GSV from the knees to the groin is competent. The recommendations were for the patient to have angiography of the lower extremity with the hope of intervention for limb salvage. Once the arterial compromise has been addressed then the venous portion can be more effectively treated. 09/05/2014 - s/p RLE angiogram without intervention 09/03/2014 by Dr Gilda Crease. No new complaints today. No significant pain. No fever or chills. Minimal drainage. 09/13/2014 -- On 09/03/2014 Procedure(s) Performed: 1. Abdominal aortogram 2. Right lower extremity distal runoff third order catheter placement 3. Additional right third order catheter placement as per  the patient the vascular surgeon will see him back in 6 months and no other procedures planned for in the near future. 09/27/2014 -- He is here for his first application of a EpiFix which was approved by his insurance company. 10/10/2014 -- easier for his second application of a Epifix. He is doing fine and has no complaints Objective Constitutional Pulse regular. Respirations normal and unlabored. Afebrile. Vitals Time Taken: 2:39 PM, Height: 72 in, Weight: 215 lbs, BMI: 29.2, Temperature: 98.6 F, Pulse: 70 bpm, Respiratory Rate: 16 breaths/min, Blood Pressure: 98/60 mmHg. Eyes Nonicteric. Reactive to light. Ears, Nose, Mouth, and Throat Lips, teeth, and gums WNL.Marland Kitchen. Moist mucosa without lesions . Neck supple and nontender. No palpable supraclavicular or cervical adenopathy. Normal sized without goiter. Respiratory WNL. No retractions.. Cardiovascular Hiltner, Tinsley E. ([id number]) Pedal Pulses WNL. No clubbing, cyanosis or edema. Musculoskeletal Adexa without tenderness or enlargement.. Digits and nails w/o clubbing, cyanosis, infection, petechiae, ischemia, or inflammatory conditions.Marland Kitchen. Psychiatric Judgement and insight Intact.. No evidence of depression, anxiety, or agitation.. General Notes: The wound on his right lower extremity  looks very clean and good and there is some epithelization from the sides. Integumentary (Hair, Skin) No suspicious lesions. No crepitus or fluctuance. No peri-wound warmth or erythema. No masses.. Wound #1 status is Open. Original cause of wound was Gradually Appeared. The wound is located on the Right,Medial Lower Leg. The wound measures 1.3cm length x 1.4cm width x 0.1cm depth; 1.429cm^2 area and 0.143cm^3 volume. The wound is limited to skin breakdown. There is no tunneling or undermining noted. There is a medium amount of serous drainage noted. The wound margin is distinct with the outline attached to the wound base. There is large (67-100%) red granulation within the wound bed. There is no necrotic tissue within the wound bed. The periwound skin appearance exhibited: Localized Edema, Moist. The periwound skin appearance did not exhibit: Callus, Crepitus, Excoriation, Fluctuance, Friable, Induration, Rash, Scarring, Dry/Scaly, Maceration, Atrophie Blanche, Cyanosis, Ecchymosis, Hemosiderin Staining, Mottled, Pallor, Rubor, Erythema. Periwound temperature was noted as No Abnormality. The periwound has tenderness on palpation. 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 Nicholas LedgerRKER, Amahd E. ([id number]) Dressing has been done in the usual fashion and a 2 layer compression is applied. Procedures Wound #1 Wound #1 is an  Arterial Insufficiency Ulcer located on the Right,Medial Lower Leg. A skin graft procedure using a bioengineered skin substitute/cellular or tissue based product was performed by Sheyla Zaffino, Ignacia FellingErrol J., MD. Epifix was applied and secured with Steri-Strips. 1.4 sq cm of product was utilized and 0 sq cm was wasted. Post Application, adaptic/mepitel was applied. A Time Out was conducted prior to the start of the procedure. The procedure was tolerated well with a pain level of 0 throughout and a pain level of 0 following the procedure. Plan Wound Cleansing: Wound #1 Right,Medial Lower Leg: May shower with protection. No tub bath. Anesthetic: Wound #1 Right,Medial Lower Leg: Topical Lidocaine 4% cream applied to wound bed prior to debridement Primary Wound  Dressing: Wound #1 Right,Medial Lower Leg: Contact layer Other: - epifix applied by Dr Meyer Russel in clinic Secondary Dressing: Wound #1 Right,Medial Lower Leg: ABD pad Dressing Change Frequency: Wound #1 Right,Medial Lower Leg: Change dressing every week Follow-up Appointments: Wound #1 Right,Medial Lower Leg: Return Appointment in 1 week. Edema Control: Wound #1 Right,Medial Lower Leg: 2 Layer Lite Compression System - Right Lower Extremity Advanced Therapies: Wound #1 Right,Medial Lower Leg: EpiFix application in clinic; including contact layer, fixation with steri strips, dry gauze and cover dressing. General Notes: order epifix #3 PRINTICE, HELLMER ([id number]) After applying the second epifix he will come back next week for her wound check. Dressing has been done in the usual fashion and a 2 layer compression is applied. Electronic Signature(s) Signed: 10/10/2014 4:41:39 PM By: Evlyn Kanner MD, FACS Entered By: Evlyn Kanner on 10/10/2014 15:20:07 Nicholas Leonard ([id number]) -------------------------------------------------------------------------------- SuperBill Details Patient Name: Nicholas Leonard, Nicholas Leonard. Date of Service:  10/10/2014 Medical Record Number: [id number] Patient Account Number: [id number] Date of Birth/Sex: 1945-08-26 (69 y.o. 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 L97.122 Non-pressure chronic ulcer of left thigh with fat layer exposed I70.232 Atherosclerosis of native arteries of right leg with ulceration of calf Facility Procedures CPT4 Code Description: [id number] (Facility Use Only) Q4131 o Epifix o Per 1 SQ CM ICD-10 Description Diagnosis E11.622 Type 2 diabetes mellitus with other skin ulcer I50.21 Acute systolic (congestive) heart failure Modifier: Quantity: 1.4 CPT4 Code Description: [id number] 15271 - SKIN SUB GRAFT TRNK/ARM/LEG ICD-10 Description Diagnosis E11.622 Type 2 diabetes mellitus with other skin ulcer I50.21 Acute systolic (congestive) heart failure I87.311 Chronic venous hypertension (idiopathic) with  ulcer of righ Modifier: t lower ext Quantity: 1 remity Physician Procedures CPT4 Code Description: [phone] (Facility Use Only) Epifix ICD-10 Description Diagnosis E11.622 Type 2 diabetes mellitus with other skin ulcer I50.21 Acute systolic (congestive) heart failure Modifier: Quantity: 1.4 CPT4 Code Description: [id number] 15271 - WC PHYS SKIN SUB GRAFT TRNK/ARM/LEG EDENILSON, AUSTAD ([id number]) Modifier: Quantity: 1 Electronic Signature(s) Signed: 10/10/2014 4:41:39 PM By: Evlyn Kanner MD, FACS Entered By: Evlyn Kanner on 10/10/2014 15:20:37

## 2014-10-18 ENCOUNTER — Encounter: Payer: Medicare Other | Attending: Surgery | Admitting: Surgery

## 2014-10-18 DIAGNOSIS — I87311 Chronic venous hypertension (idiopathic) with ulcer of right lower extremity: Secondary | ICD-10-CM | POA: Diagnosis not present

## 2014-10-18 DIAGNOSIS — Z95810 Presence of automatic (implantable) cardiac defibrillator: Secondary | ICD-10-CM | POA: Diagnosis not present

## 2014-10-18 DIAGNOSIS — L97911 Non-pressure chronic ulcer of unspecified part of right lower leg limited to breakdown of skin: Secondary | ICD-10-CM | POA: Diagnosis present

## 2014-10-18 DIAGNOSIS — I87011 Postthrombotic syndrome with ulcer of right lower extremity: Secondary | ICD-10-CM | POA: Insufficient documentation

## 2014-10-18 DIAGNOSIS — I2510 Atherosclerotic heart disease of native coronary artery without angina pectoris: Secondary | ICD-10-CM | POA: Diagnosis not present

## 2014-10-18 DIAGNOSIS — I4891 Unspecified atrial fibrillation: Secondary | ICD-10-CM | POA: Insufficient documentation

## 2014-10-18 DIAGNOSIS — I255 Ischemic cardiomyopathy: Secondary | ICD-10-CM | POA: Insufficient documentation

## 2014-10-18 DIAGNOSIS — I5021 Acute systolic (congestive) heart failure: Secondary | ICD-10-CM | POA: Insufficient documentation

## 2014-10-18 DIAGNOSIS — I70232 Atherosclerosis of native arteries of right leg with ulceration of calf: Secondary | ICD-10-CM | POA: Diagnosis not present

## 2014-10-18 DIAGNOSIS — Z86718 Personal history of other venous thrombosis and embolism: Secondary | ICD-10-CM | POA: Insufficient documentation

## 2014-10-18 DIAGNOSIS — Z7901 Long term (current) use of anticoagulants: Secondary | ICD-10-CM | POA: Diagnosis not present

## 2014-10-18 DIAGNOSIS — N183 Chronic kidney disease, stage 3 (moderate): Secondary | ICD-10-CM | POA: Diagnosis not present

## 2014-10-18 DIAGNOSIS — E039 Hypothyroidism, unspecified: Secondary | ICD-10-CM | POA: Diagnosis not present

## 2014-10-18 DIAGNOSIS — I80291 Phlebitis and thrombophlebitis of other deep vessels of right lower extremity: Secondary | ICD-10-CM | POA: Diagnosis not present

## 2014-10-18 DIAGNOSIS — E11622 Type 2 diabetes mellitus with other skin ulcer: Secondary | ICD-10-CM | POA: Diagnosis not present

## 2014-10-18 NOTE — Progress Notes (Addendum)
Nicholas Leonard, Nicholas Leonard ([id number]) Visit Report for 10/18/2014 Chief Complaint Document Details Patient Name: Nicholas Leonard, Nicholas Leonard. Date of Service: 10/18/2014 1:00 PM Medical Record Number: [id number] Patient Account Number: [id number] Date of Birth/Sex: 1945-07-11 (69 y.o. Male) Treating RN: Clover Mealy, RN, BSN, [place] Sink Primary Care Physician: Daniel Nones Other Clinician: Referring Physician: Daniel Nones Treating Physician/Extender: Rudene Re in Treatment: 14 Information Obtained from: Patient Chief Complaint Patient presents to the wound care center for a consult due non healing wound ulcer on the right lower limb which was noted to be there for about 3 weeks now. he is also had swelling of both lower extremities for about 2 months now. Electronic Signature(s) Signed: 10/18/2014 1:22:16 PM By: Evlyn Kanner MD, FACS Entered By: Evlyn Kanner on 10/18/2014 13:22:16 Nicholas Leonard ([id number]) -------------------------------------------------------------------------------- HPI Details Patient Name: Nicholas Leonard, Nicholas Leonard. Date of Service: 10/18/2014 1:00 PM Medical Record Number: [id number] Patient Account Number: [id number] Date of Birth/Sex: 12-17-1945 (69 y.o. Male) Treating RN: Clover Mealy, RN, BSN, [place] Sink Primary Care Physician: Daniel Nones Other Clinician: Referring Physician: Daniel Nones Treating Physician/Extender: Rudene Re in Treatment: 14 History of Present Illness HPI Description: 69 year old gentleman who is recently discharged from [facility] and was admitted between 3 18-3/22 by his primary care physician Dr. Daniel Nones. He has adult onset diabetes and was recently admitted for acute on chronic left-sided systolic congestive heart failure, acute on chronic renal failure, coronary artery disease, ischemic cardiomyopathy, ventricular fibrillation status post AICD implantation, history of atrial fibrillation on Coumadin therapy, hypothyroidism, history of venous  stasis ulcer, pulmonary embolism with deep vein thrombosis on chronic Coumadin therapy, osteoarthritis and gout. He does not smoke any longer and occasionally has alcohol. as not been compliant with getting his blood sugars checked and does not know the last time he had a hemoglobin A1c done. Does not recall when the last ultrasound of his lower extremities was done but he does say that he has had clots in his legs. 07/30/2014 the patient is back to [place] after a recent admission and discharge from the hospital and he was there between 07/19/2014 and 07/26/2014. Reviewed these notes and his final diagnosis was #1 elevated INR, #2 acute on chronic left-sided systolic congestive heart failure, #3 hypothyroidism, #4 gout, #5 chronic kidney disease stage III, #6 acute renal failure, #7 carotid artery disease, #8 history of pulmonary embolism and DVT, #9 adult onset diabetes mellitus. During the admission he was corrected with giving vitamin K and due to his difficulty with Coumadin he was changed over to Eliquis. The patient also was found to have diffuse edema and there was evidence of ascites on abdominal ultrasound and was placed on a Lasix drip by nephrologist. His just discharge medications were noted by me. they have been using Santyl on his right lower extremity wound and he has been doing this dressing as often as the nurse comes in. he has also developed a ulcer of the left thigh and this has been there for about 2 weeks and has some necrosis of skin over there. 08/23/2014 -- he recently saw the vascular surgeon Dr. Gilda Crease and has had both an arterial and duplex venous study done. Though we do not have the official report yet the patient tells me that his arteries were okay and there was no problem with the blood flow. 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 08/30/2014 -- his vascular surgery has been  scheduled for Tuesday, May 24 and he will come back and see us if he needs to soon after that for a rewrap. Addendum: We have finally received his transcript of his office visit with Dr. Gilda CreaseSchnier on 08/22/2014 and note that the duplex US of the lower extremity arterial system bilaterally SFA are patent, with diffuse tibial disease with monophasic tibial signals on the right and biphasic signals on the left. Duplex Venous ultrasound of the lower extremity shows normal deep venous system, superficial reflux is present over a short segment of the GSV in the region of the ulcer. However the GSV from the knees to the groin is competent. The recommendations were for the patient to have angiography of the lower extremity with the hope of Nicholas LedgerRKER, Nicholas E. ([id number]) intervention for limb salvage. Once the arterial compromise has been addressed then the venous portion can be more effectively treated. 09/05/2014 - s/p RLE angiogram without intervention 09/03/2014 by Dr Gilda CreaseSchnier. No new complaints today. No significant pain. No fever or chills. Minimal drainage. 09/13/2014 -- On 09/03/2014 o Procedure(s) Performed: 1. Abdominal aortogram 2. Right lower extremity distal runoff third order catheter placement 3. Additional right third order catheter placement as per the patient the vascular surgeon will see him back in 6 months and no other procedures planned for in the near future. 09/27/2014 -- He is here for his first application of a EpiFix which was approved by his insurance company. 10/10/2014 -- easier for his second application of a Epifix. He is doing fine and has no complaints Electronic Signature(s) Signed: 10/18/2014 1:22:21 PM By: Evlyn KannerBritto, Friedrich Harriott MD, FACS Entered By: Evlyn KannerBritto, Zarion Oliff on 10/18/2014 13:22:20 Nicholas LedgerPARKER, Nicholas E. ([id number]) -------------------------------------------------------------------------------- Physical Exam Details Patient Name: Nicholas LedgerRKER, Nicholas E. Date of Service: 10/18/2014  1:00 PM Medical Record Number: [id number] Patient Account Number: [id number] Date of Birth/Sex: 1946/02/08 32(68 y.o. Male) Treating RN: Clover MealyAfful, RN, BSN, [place] Sinkita Primary Care Physician: Daniel NonesKLEIN, BERT Other Clinician: Referring Physician: Daniel NonesKLEIN, BERT Treating Physician/Extender: Rudene ReBritto, Jandiel Magallanes Weeks in Treatment: 14 Constitutional . Pulse regular. Respirations normal and unlabored. Afebrile. . Eyes Nonicteric. Reactive to light. Ears, Nose, Mouth, and Throat Lips, teeth, and gums WNL.Marland Kitchen. Moist mucosa without lesions . Neck supple and nontender. No palpable supraclavicular or cervical adenopathy. Normal sized without goiter. Respiratory WNL. No retractions.. Cardiovascular Pedal Pulses WNL. No clubbing, cyanosis or edema. Musculoskeletal Adexa without tenderness or enlargement.. Digits and nails w/o clubbing, cyanosis, infection, petechiae, ischemia, or inflammatory conditions.. Integumentary (Hair, Skin) No suspicious lesions. No crepitus or fluctuance. No peri-wound warmth or erythema. No masses.Marland Kitchen. Psychiatric Judgement and insight Intact.. No evidence of depression, anxiety, or agitation.. Notes The wound looks excellent and has good amount of epithelialization. Electronic Signature(s) Signed: 10/18/2014 1:23:00 PM By: Evlyn KannerBritto, Aamari Strawderman MD, FACS Entered By: Evlyn KannerBritto, Brooke Payes on 10/18/2014 13:23:00 Nicholas LedgerPARKER, Dashiell E. ([id number]) -------------------------------------------------------------------------------- Physician Orders Details Patient Name: Nicholas LedgerRKER, Julus E. Date of Service: 10/18/2014 1:00 PM Medical Record Number: [id number] Patient Account Number: [id number] Date of Birth/Sex: 1946/02/08 27(68 y.o. Male) Treating RN: Clover MealyAfful, RN, BSN, [place] Sinkita Primary Care Physician: Daniel NonesKLEIN, BERT Other Clinician: Referring Physician: Daniel NonesKLEIN, BERT Treating Physician/Extender: Rudene ReBritto, Ellary Casamento Weeks in Treatment: 6214 Verbal / Phone Orders: Yes Clinician: Afful, RN, BSN, Rita Read Back and Verified: Yes Diagnosis  Coding Wound Cleansing Wound #1 Right,Medial Lower Leg o May shower with protection. o No tub bath. Skin Barriers/Peri-Wound Care Wound #1 Right,Medial Lower Leg o Moisturizing lotion Primary Wound Dressing Wound #1 Right,Medial Lower Leg o Other: - Mepitel Secondary Dressing Wound #1 Right,Medial Lower Leg o  Dry Gauze Dressing Change Frequency Wound #1 Right,Medial Lower Leg o Change dressing every week Follow-up Appointments Wound #1 Right,Medial Lower Leg o Return Appointment in 1 week. Edema Control Wound #1 Right,Medial Lower Leg o 2 Layer Lite Compression System - Right Lower Extremity Home Health Wound #1 Right,Medial Lower Leg o D/C Home Health Services - May discharge home health - wound center continues to care for wound and wrap leg weekly - no need for home health to perform any wound care Nicholas Leonard, Nicholas Leonard ([id number]) Notes Order compression stocking Electronic Signature(s) Signed: 10/22/2014 3:09:43 PM By: Curtis Sites Signed: 10/22/2014 4:00:02 PM By: Evlyn Kanner MD, FACS Previous Signature: 10/18/2014 1:22:29 PM Version By: Elpidio Eric BSN, RN Previous Signature: 10/21/2014 12:34:21 PM Version By: Evlyn Kanner MD, FACS Entered By: Curtis Sites on 10/22/2014 15:09:43 Nicholas Leonard ([id number]) -------------------------------------------------------------------------------- Problem List Details Patient Name: DEMARUS, LATTERELL. Date of Service: 10/18/2014 1:00 PM Medical Record Number: [id number] Patient Account Number: [id number] Date of Birth/Sex: 1946-01-26 (68 y.o. Male) Treating RN: Clover Mealy, RN, BSN, [place] Sink Primary Care Physician: Daniel Nones Other Clinician: Referring Physician: Daniel Nones Treating Physician/Extender: Rudene Re in Treatment: 14 Active Problems ICD-10 Encounter Code Description Active Date Diagnosis E11.622 Type 2 diabetes mellitus with other skin ulcer 07/09/2014 Yes I50.21 Acute systolic  (congestive) heart failure 07/09/2014 Yes I80.291 Phlebitis and thrombophlebitis of other deep vessels of 07/09/2014 Yes right lower extremity I87.311 Chronic venous hypertension (idiopathic) with ulcer of 07/09/2014 Yes right lower extremity 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 Date of Service: 10/18/2014 1:00 PM Medical Record Number: [id number] Patient Account Number: [id number] Date of Birth/Sex: 1946/03/15 (69 y.o. Male) Treating RN: Clover Mealy, RN, BSN, [place] Sink Primary Care Physician: Daniel Nones Other Clinician: Referring Physician: Daniel Nones Treating Physician/Extender: Rudene Re in Treatment: 14 Subjective Chief Complaint Information obtained from Patient Patient presents to the wound care center for a consult due non healing wound ulcer on the right lower limb which was noted to be there for about 3 weeks now. he is also had swelling of both lower extremities for about 2 months now. History of Present Illness (HPI) 69 year old gentleman who is recently discharged from [facility] and was admitted between 3 18-3/22 by his primary care physician Dr. Daniel Nones. He has adult onset diabetes and was recently admitted for acute on chronic left-sided systolic congestive heart failure, acute on chronic renal failure, coronary artery disease, ischemic cardiomyopathy, ventricular  fibrillation status post AICD implantation, history of atrial fibrillation on Coumadin therapy, hypothyroidism, history of venous stasis ulcer, pulmonary embolism with deep vein thrombosis on chronic Coumadin therapy, osteoarthritis and gout. He does not smoke any longer and occasionally has alcohol. as not been compliant with getting his blood sugars checked and does not know the last time he had a hemoglobin A1c done. Does not recall when the last ultrasound of his lower extremities was done but he does say that he has had clots in his legs. 07/30/2014 the patient is back to [place] after a recent admission and discharge from the hospital and he was there between 07/19/2014  and 07/26/2014. Reviewed these notes and his final diagnosis was #1 elevated INR, #2 acute on chronic left-sided systolic congestive heart failure, #3 hypothyroidism, #4 gout, #5 chronic kidney disease stage III, #6 acute renal failure, #7 carotid artery disease, #8 history of pulmonary embolism and DVT, #9 adult onset diabetes mellitus. During the admission he was corrected with giving vitamin K and due to his difficulty with Coumadin he was changed over to Eliquis. The patient also was found to have diffuse edema and there was evidence of ascites on abdominal ultrasound and was placed on a Lasix drip by nephrologist. His just discharge medications were noted by me. they have been using Santyl on his right lower extremity wound and he has been doing this dressing as often as the nurse comes in. he has also developed a ulcer of the left thigh and this has been there for about 2 weeks and has some necrosis of skin over there. 08/23/2014 -- he recently saw the vascular surgeon Dr. Gilda Crease and has had both an arterial and duplex venous study done. Though we do not have the official report yet the patient tells me that his arteries were okay and there was no problem with the blood flow. However he does have varicose veins  and incompetence of the venous system and he is going to be scheduled for endovenous ablation. 08/30/2014 -- his vascular surgery has been scheduled for Tuesday, May 24 and he will come back and see Nicholas Leonard, Nicholas Leonard ([id number]) [place] if he needs to soon after that for a rewrap. Addendum: We have finally received his transcript of his office visit with Dr. Gilda Crease on 08/22/2014 and note that the duplex US of the lower extremity arterial system bilaterally SFA are patent, with diffuse tibial disease with monophasic tibial signals on the right and biphasic signals on the left. Duplex Venous ultrasound of the lower extremity shows normal deep venous system, superficial reflux is present over a short segment of the GSV in the region of the ulcer. However the GSV from the knees to the groin is competent. The recommendations were for the patient to have angiography of the lower extremity with the hope of intervention for limb salvage. Once the arterial compromise has been addressed then the venous portion can be more effectively treated. 09/05/2014 - s/p RLE angiogram without intervention 09/03/2014 by Dr Gilda Crease. No new complaints today. No significant pain. No fever or chills. Minimal drainage. 09/13/2014 -- On 09/03/2014 Procedure(s) Performed: 1. Abdominal aortogram 2. Right lower extremity distal runoff third order catheter placement 3. Additional right third order catheter placement as per the patient the vascular surgeon will see him back in 6 months and no other procedures planned for in the near future. 09/27/2014 -- He is here for his first application of a EpiFix which was approved by his insurance company. 10/10/2014 -- easier for his second application of a Epifix. He is doing fine and has no complaints Objective Constitutional Pulse regular. Respirations normal and unlabored. Afebrile. Vitals Time Taken: 1:10 PM, Height: 72 in, Weight: 215 lbs, BMI: 29.2, Temperature: 98.1 F, Pulse:  85 bpm, Respiratory Rate: 16 breaths/min, Blood Pressure: 99/62 mmHg. Eyes Nonicteric. Reactive to light. Ears, Nose, Mouth, and Throat Lips, teeth, and gums WNL.Marland Kitchen Moist mucosa without lesions . Neck supple and nontender. No palpable supraclavicular or cervical adenopathy. Normal sized without goiter. Respiratory WNL. No retractions.. Cardiovascular Nicholas Leonard, Nicholas E. ([id number]) Pedal Pulses WNL. No clubbing, cyanosis or edema. Musculoskeletal Adexa without tenderness or enlargement.. Digits and  nails w/o clubbing, cyanosis, infection, petechiae, ischemia, or inflammatory conditions.Marland Kitchen Psychiatric Judgement and insight Intact.. No evidence of depression, anxiety, or agitation.. General Notes: The wound looks excellent and has good amount of epithelialization. Integumentary (Hair, Skin) No suspicious lesions. No crepitus or fluctuance. No peri-wound warmth or erythema. No masses.. Wound #1 status is Open. Original cause of wound was Gradually Appeared. The wound is located on the Right,Medial Lower Leg. The wound measures 0.9cm length x 0.9cm width x 0.1cm depth; 0.636cm^2 area and 0.064cm^3 volume. The wound is limited to skin breakdown. There is no tunneling or undermining noted. There is a small amount of serous drainage noted. The wound margin is distinct with the outline attached to the wound base. There is large (67-100%) red granulation within the wound bed. There is no necrotic tissue within the wound bed. The periwound skin appearance exhibited: Localized Edema, Moist. The periwound skin appearance did not exhibit: Callus, Crepitus, Excoriation, Fluctuance, Friable, Induration, Rash, Scarring, Dry/Scaly, Maceration, Atrophie Blanche, Cyanosis, Ecchymosis, Hemosiderin Staining, Mottled, Pallor, Rubor, Erythema. Periwound temperature was noted as No Abnormality. The periwound has tenderness on palpation. 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 The wound looks very good and we will apply just some Mepitel and use the Profore lite compression. Nicholas Leonard, [place] ([id number]) We need to check his compression stockings which she will bring to [place] next week and if these are not adequate we will need to order him some fresh ones. Plan Wound Cleansing: Wound #1 Right,Medial Lower Leg: May shower with protection. No tub bath. Skin Barriers/Peri-Wound Care: Wound #1 Right,Medial Lower Leg: Moisturizing lotion Primary Wound Dressing: Wound #1 Right,Medial Lower Leg: Other: - Mepitel Secondary Dressing: Wound #1 Right,Medial Lower Leg: Dry Gauze Dressing Change Frequency: Wound #1 Right,Medial Lower Leg: Change dressing every week Follow-up Appointments: Wound #1 Right,Medial Lower Leg: Return Appointment in 1 week. Edema Control: Wound #1 Right,Medial Lower Leg: 2 Layer Lite Compression System - Right Lower Extremity Home Health: Wound #1 Right,Medial Lower Leg: D/C Home Health Services - May discharge home health - wound center continues to care for wound and wrap leg weekly - no need for home health to perform any wound care General Notes: Order compression stocking He is here for a wound check today after his second application of a Epiefix. The wound looks very good and we will apply just some Mepitel and use the Profore lite compression. We need to check his compression stockings  which she will bring to [place] next week and if these are not adequate we will need to order him some fresh ones. Electronic Signature(s) Signed: 10/22/2014 3:58:32 PM By: Evlyn Kanner MD, FACS KABLE, [place] ([id number]) Previous Signature: 10/22/2014 3:58:12 PM Version By: Evlyn Kanner MD, FACS Previous Signature: 10/18/2014 1:24:29 PM Version By: Evlyn Kanner MD, FACS Entered By: Evlyn Kanner on 10/22/2014 15:58:32 Nicholas Leonard ([id number]) -------------------------------------------------------------------------------- SuperBill Details Patient Name: LIRON, EISSLER. Date of Service: 10/18/2014 Medical Record Number: [id number] Patient Account Number:  [id number] Date of Birth/Sex: 05/19/1945 (69 y.o. 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 L97.122 Non-pressure chronic ulcer of left thigh with fat layer exposed I70.232 Atherosclerosis of native arteries of right leg with ulceration of calf Facility Procedures CPT4: Description Modifier Quantity Code [id number] 99213 - WOUND CARE VISIT-LEV 3 EST PT 1 CPT4: [id number] (Facility Use Only) 13244WN - APPLY MULTLAY COMPRS LWR RT 1 LEG Physician Procedures CPT4 Code Description: [id number] 99213 - WC PHYS LEVEL 3 - EST PT ICD-10 Description Diagnosis E11.622 Type 2 diabetes mellitus with other skin ulcer I87.311 Chronic venous hypertension (idiopathic) with ulcer of I87.011 Postthrombotic syndrome with ulcer   of right lower extre Modifier: right lower e mity Quantity: 1 xtremity Electronic Signature(s) Signed: 10/18/2014 1:29:47 PM By: Elpidio Eric BSN, RN Signed: 10/21/2014 12:34:21 PM By: Evlyn Kanner MD, FACS Previous Signature: 10/18/2014 1:24:49 PM Version By: Evlyn Kanner MD, FACS Entered By: Elpidio Eric on 10/18/2014 13:29:47

## 2014-10-18 NOTE — Progress Notes (Signed)
MATHAN, DARROCH ([id number]) Visit Report for 10/18/2014 Arrival Information Details Patient Name: DEMETRION, WESBY. Date of Service: 10/18/2014 1:00 PM Medical Record Number: [id number] Patient Account Number: [id number] Date of Birth/Sex: 20-Jan-1946 (70 y.o. Male) Treating RN: Clover Mealy, RN, BSN, [place] Sink Primary Care Physician: Daniel Nones Other Clinician: Referring Physician: Daniel Nones Treating Physician/Extender: Rudene Re in Treatment: 14 Visit Information History Since Last Visit Any new allergies or adverse reactions: No Patient Arrived: Ambulatory Had a fall or experienced change in No Arrival Time: 13:06 activities of daily living that may affect Accompanied By: self risk of falls: Transfer Assistance: None Signs or symptoms of abuse/neglect since last No Patient Identification Verified: Yes visito Secondary Verification Process Yes Hospitalized since last visit: No Completed: Has Dressing in Place as Prescribed: Yes Patient Requires Transmission- No Has Compression in Place as Prescribed: Yes Based Precautions: Pain Present Now: No Patient Has Alerts: Yes Patient Alerts: Patient on Blood Thinner Electronic Signature(s) Signed: 10/18/2014 1:10:34 PM By: Elpidio Eric BSN, RN Entered By: Elpidio Eric on 10/18/2014 13:10:34 [name] ([id number]) -------------------------------------------------------------------------------- Clinic Level of Care Assessment Details Patient Name: [name] Date of Service: 10/18/2014 1:00 PM Medical Record Number: [id number] Patient Account Number: [id number] Date of Birth/Sex: 1946-02-08 (69 y.o. Male) Treating RN: Clover Mealy, RN, BSN, [place] Primary Care Physician: Daniel Nones Other Clinician: Referring Physician: Daniel Nones Treating Physician/Extender: Rudene Re in Treatment: 14 Clinic Level of Care Assessment Items TOOL 4 Quantity Score []  - Use when only an EandM is performed on FOLLOW-UP visit  0 ASSESSMENTS - Nursing Assessment / Reassessment X - Reassessment of Co-morbidities (includes updates in patient status) 1 10 X - Reassessment of Adherence to Treatment Plan 1 5 ASSESSMENTS - Wound and Skin Assessment / Reassessment X - Simple Wound Assessment / Reassessment - one wound 1 5 []  - Complex Wound Assessment / Reassessment - multiple wounds 0 []  - Dermatologic / Skin Assessment (not related to wound area) 0 ASSESSMENTS - Focused Assessment []  - Circumferential Edema Measurements - multi extremities 0 []  - Nutritional Assessment / Counseling / Intervention 0 X - Lower Extremity Assessment (monofilament, tuning fork, pulses) 1 5 []  - Peripheral Arterial Disease Assessment (using hand held doppler) 0 ASSESSMENTS - Ostomy and/or Continence Assessment and Care []  - Incontinence Assessment and Management 0 []  - Ostomy Care Assessment and Management (repouching, etc.) 0 PROCESS - Coordination of Care X - Simple Patient / Family Education for ongoing care 1 15 []  - Complex (extensive) Patient / Family Education for ongoing care 0 []  - Staff obtains Chiropractor, Records, Test Results / Process Orders 0 []  - Staff telephones HHA, Nursing Homes / Clarify orders / etc 0 []  - Routine Transfer to another Facility (non-emergent condition) 0 NIHAAL, FRIESEN ([id number]) []  - Routine Hospital Admission (non-emergent condition) 0 []  - New Admissions / Manufacturing engineer / Ordering NPWT, Apligraf, etc. 0 []  - Emergency Hospital Admission (emergent condition) 0 X - Simple Discharge Coordination 1 10 []  - Complex (extensive) Discharge Coordination 0 PROCESS - Special Needs []  - Pediatric / Minor Patient Management 0 []  - Isolation Patient Management 0 []  - Hearing / Language / Visual special needs 0 []  - Assessment of Community assistance (transportation, D/C planning, etc.) 0 []  - Additional assistance / Altered mentation 0 []  - Support Surface(s) Assessment (bed, cushion, seat, etc.)  0 INTERVENTIONS - Wound Cleansing / Measurement X - Simple Wound Cleansing - one wound 1 5 []  - Complex Wound Cleansing - multiple wounds 0  X - Wound Imaging (photographs - any number of wounds) 1 5  - Wound Tracing (instead of photographs) 0 X - Simple Wound Measurement - one wound 1 5  - Complex Wound Measurement - multiple wounds 0 INTERVENTIONS - Wound Dressings X - Small Wound Dressing one or multiple wounds 1 10  - Medium Wound Dressing one or multiple wounds 0  - Large Wound Dressing one or multiple wounds 0  - Application of Medications - topical 0  - Application of Medications - injection 0 INTERVENTIONS - Miscellaneous  - External ear exam 0 Churchwell, Saunders E. ([id number])  - Specimen Collection (cultures, biopsies, blood, body fluids, etc.) 0  - Specimen(s) / Culture(s) sent or taken to Lab for analysis 0  - Patient Transfer (multiple staff / Michiel Sites Lift / Similar devices) 0  - Simple Staple / Suture removal (25 or less) 0  - Complex Staple / Suture removal (26 or more) 0  - Hypo / Hyperglycemic Management (close monitor of Blood Glucose) 0  - Ankle / Brachial Index (ABI) - do not check if billed separately 0 X - Vital Signs 1 5 Has the patient been seen at the hospital within the last three years: Yes Total Score: 80 Level Of Care: New/Established - Level 3 Electronic Signature(s) Signed: 10/18/2014 1:29:21 PM By: Elpidio Eric BSN, RN Previous Signature: 10/18/2014 1:28:39 PM Version By: Elpidio Eric BSN, RN Entered By: Elpidio Eric on 10/18/2014 13:29:21 [name] ([id number]) -------------------------------------------------------------------------------- Encounter Discharge Information Details Patient Name: DENI, LEFEVER. Date of Service: 10/18/2014 1:00 PM Medical Record Number: [id number] Patient Account Number: [id number] Date of Birth/Sex: 1946/01/24 (69 y.o. Male) Treating RN: Clover Mealy, RN, BSN, [place] Sink Primary Care Physician: Daniel Nones Other Clinician: Referring Physician: Daniel Nones Treating Physician/Extender: Rudene Re in Treatment: 14 Encounter Discharge Information Items Discharge Pain Level: 0 Discharge Condition: Stable Ambulatory Status: Ambulatory Discharge Destination: Home Private Transportation: Auto Accompanied By: self Schedule Follow-up Appointment: No Medication Reconciliation completed and No provided to Patient/Care Chanti Golubski: Clinical Summary of Care: Electronic Signature(s) Signed: 10/18/2014 1:30:38 PM By: Elpidio Eric BSN, RN Entered By: Elpidio Eric on 10/18/2014 13:30:37 [name] ([id number]) -------------------------------------------------------------------------------- Lower Extremity Assessment Details Patient Name: [name]. Date of Service: 10/18/2014 1:00 PM Medical Record Number: [id number] Patient Account Number: [id number] Date of Birth/Sex: [birth year] (69 y.o. Male) Treating RN: Clover Mealy, RN, BSN, [place] Sink Primary Care Physician: Daniel Nones Other Clinician: Referring Physician: Daniel Nones Treating Physician/Extender: Rudene Re in Treatment: 14 Edema Assessment Assessed: [Left: No] [Right: No] E[Left: dema] [Right: :] Calf Left: Right: Point of Measurement: 39 cm From Medial Instep cm 36.3 cm Ankle Left: Right: Point of Measurement: 12 cm From Medial Instep cm 23.2 cm Vascular Assessment Claudication: Claudication Assessment [Right:None] Pulses: Posterior Tibial Dorsalis Pedis Palpable: [Right:Yes] Extremity colors, hair growth, and conditions: Extremity Color: [Right:Mottled] Hair Growth on Extremity: [Right:Yes] Temperature of Extremity: [Right:Warm] Capillary Refill: [Right:< 3 seconds] Dependent Rubor: [Right:No] Blanched when Elevated: [Right:No] Lipodermatosclerosis: [Right:No] Toe Nail Assessment Left: Right: Thick: Yes Discolored: Yes Deformed: No Improper Length and Hygiene: Yes TERYN, GUST  ([id number]) Electronic Signature(s) Signed: 10/18/2014 1:12:54 PM By: Elpidio Eric BSN, RN Entered By: Elpidio Eric on 10/18/2014 13:12:54 Loring, [name] ([id number]) -------------------------------------------------------------------------------- Multi Wound Chart Details Patient Name: [name]. Date of Service: 10/18/2014 1:00 PM Medical Record Number: [id number] Patient Account Number: [id number] Date of Birth/Sex: March 03, 1946 (69 y.o. 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] Comorbid History: [1:Congestive Heart Failure, N/A Coronary Artery Disease, Hypertension, Type II Diabetes, Gout, Neuropathy] Date Acquired: [1:06/10/2014] [N/A:N/A] Weeks of Treatment: [1:14] [N/A:N/A] Wound Status: [1:Open] [N/A:N/A] Measurements L x W x D 0.9x0.9x0.1 [N/A:N/A] (cm) Area (cm) : [1:0.636] [N/A:N/A] Volume (cm) : [1:0.064] [N/A:N/A] % Reduction in Area: [1:49.40%] [N/A:N/A] % Reduction in Volume: 49.20% [N/A:N/A] Classification: [1:Partial Thickness] [N/A:N/A] HBO Classification: [1:Grade 1] [N/A:N/A] Exudate Amount: [1:Small] [N/A:N/A] Exudate Type: [1:Serous] [N/A:N/A] Exudate Color: [1:amber] [N/A:N/A] Wound Margin: [1:Distinct, outline attached N/A] Granulation Amount: [1:Large (67-100%)] [N/A:N/A] Granulation Quality: [1:Red, Hyper-granulation] [N/A:N/A] Necrotic Amount: [1:None Present (0%)]  [N/A:N/A] Exposed Structures: [N/A:N/A] Fascia: No Fat: No Tendon: No Muscle: No Joint: No Bone: No Limited to Skin Breakdown Epithelialization: Large (67-100%) N/A N/A Periwound Skin Texture: Edema: Yes N/A N/A Excoriation: No Induration: No Callus: No Crepitus: No Fluctuance: No Friable: No Rash: No Scarring: No Periwound Skin Moist: Yes N/A N/A Moisture: Maceration: No Dry/Scaly: No Periwound Skin Color: Atrophie Blanche: No N/A N/A Cyanosis: No Ecchymosis: No Erythema: No Hemosiderin Staining: No Mottled: No Pallor: No Rubor: No Temperature: No Abnormality N/A N/A Tenderness on Yes N/A N/A Palpation: Wound Preparation: Ulcer Cleansing: Other: N/A N/A soap and water Topical Anesthetic Applied: Other: lidocaine 4% Treatment Notes Electronic Signature(s) Signed: 10/18/2014 1:20:10 PM By: Elpidio Eric BSN, RN Entered By: Elpidio Eric on 10/18/2014 13:20:09 [name] ([id number]) -------------------------------------------------------------------------------- Multi-Disciplinary Care Plan Details Patient Name: WILFRED, DAYRIT. Date of Service: 10/18/2014 1:00 PM Medical Record Number: [id number] Patient Account Number: [id number] Date of Birth/Sex: 1945-08-31 (69 y.o. Male) Treating RN: Clover Mealy, RN, BSN, [place] Sink Primary Care Physician: Daniel Nones Other Clinician: Referring Physician: Daniel Nones Treating Physician/Extender: Rudene Re in Treatment: 14 Active Inactive Nutrition Nursing Diagnoses: Impaired glucose control: actual or potential Potential for alteratiion in Nutrition/Potential for imbalanced nutrition Goals: Patient/caregiver verbalizes understanding of need to maintain therapeutic glucose control per primary care physician Date Initiated: 07/09/2014 Goal Status: Active Patient/caregiver will maintain therapeutic glucose control Date Initiated: 07/09/2014 Goal Status: Active Interventions: Assess patient nutrition upon admission  and as needed per policy Provide education on elevated blood sugars and impact on wound healing Provide education on nutrition Treatment Activities: Education provided on Nutrition : 07/09/2014 Notes: Orientation to the Wound Care Program Nursing Diagnoses: Knowledge deficit related to the wound healing center program Goals: Patient/caregiver will verbalize understanding of the Wound Healing Center Program Date Initiated: 07/09/2014 Goal Status: Active Interventions: Provide education on orientation to the wound center DRAYCEN, LEICHTER. ([id number]) Notes: Venous Leg Ulcer Nursing Diagnoses: Knowledge deficit related to disease process and management Potential for venous Insuffiency (use before diagnosis confirmed) Goals: Patient will maintain optimal edema control Date Initiated: 07/09/2014 Goal Status: Active Verify adequate tissue perfusion prior to therapeutic compression application Date Initiated: 07/09/2014 Goal Status: Active Interventions: Assess peripheral edema status every visit. Compression as ordered Provide education on venous insufficiency Treatment Activities: Test ordered outside of clinic : 10/18/2014 Therapeutic compression applied : 10/18/2014 Notes: Wound/Skin Impairment Nursing Diagnoses: Knowledge deficit related to ulceration/compromised skin integrity Goals: Patient/caregiver will verbalize understanding of skin care regimen Date Initiated: 07/09/2014 Goal Status: Active Ulcer/skin breakdown will heal  within 14 weeks Date Initiated: 07/09/2014 Goal Status: Active Interventions: Assess patient/caregiver ability to obtain necessary supplies Assess patient/caregiver ability to perform ulcer/skin care regimen upon admission and as needed Assess ulceration(s) every visit Provide education on ulcer and skin care HAWK, MONES ([id number]) Treatment Activities: Patient referred to home care : 10/18/2014 Skin care regimen initiated : 10/18/2014 Topical  wound management initiated : 10/18/2014 Notes: Electronic Signature(s) Signed: 10/18/2014 1:19:32 PM By: Elpidio Eric BSN, RN Entered By: Elpidio Eric on 10/18/2014 13:19:32 [name] ([id number]) -------------------------------------------------------------------------------- Pain Assessment Details Patient Name: [name]. Date of Service: 10/18/2014 1:00 PM Medical Record Number: [id number] Patient Account Number: [id number] Date of Birth/Sex: 27-Nov-1945 (69 y.o. Male) Treating RN: Clover Mealy, RN, BSN, [place] Sink Primary Care Physician: Daniel Nones Other Clinician: Referring Physician: Daniel Nones Treating Physician/Extender: Rudene Re in Treatment: 14 Active Problems Location of Pain Severity and Description of Pain Patient Has Paino No Site Locations Pain Management and Medication Current Pain Management: Electronic Signature(s) Signed: 10/18/2014 1:10:40 PM By: Elpidio Eric BSN, RN Entered By: Elpidio Eric on 10/18/2014 13:10:39 [name] ([id number]) -------------------------------------------------------------------------------- Patient/Caregiver Education Details Patient Name: CAS, TRACZ. Date of Service: 10/18/2014 1:00 PM Medical Record Number: [id number] Patient Account Number: [id number] Date of Birth/Gender: 12-27-1945 (69 y.o. Male) Treating RN: Clover Mealy, RN, BSN, [place] Sink Primary Care Physician: Daniel Nones Other Clinician: Referring Physician: Daniel Nones Treating Physician/Extender: Rudene Re in Treatment: 14 Education Assessment Education Provided To: Patient Education Topics Provided Elevated Blood Sugar/ Impact on Healing: Methods: Explain/Verbal Responses: State content correctly Nutrition: Handouts: Elevated Blood Sugars: How Do They Affect Wound Healing Responses: State content correctly Venous: Methods: Explain/Verbal Responses: State content correctly Welcome To The Wound Care Center: Methods: Explain/Verbal Responses:  State content correctly Wound/Skin Impairment: Methods: Explain/Verbal Responses: State content correctly Electronic Signature(s) Signed: 10/18/2014 1:31:08 PM By: Elpidio Eric BSN, RN Entered By: Elpidio Eric on 10/18/2014 13:31:07 [name] ([id number]) -------------------------------------------------------------------------------- Wound Assessment Details Patient Name: [name]. Date of Service: 10/18/2014 1:00 PM Medical Record Number: [id number] Patient Account Number: [id number] Date of Birth/Sex: 06/02/1945 (69 y.o. 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 - Medial Secondary Diabetic Wound/Ulcer of the Lower Wounding Event: Gradually Appeared Etiology: Extremity Date Acquired: 06/10/2014 Wound Open Weeks Of Treatment: 14 Status: Clustered Wound: No Comorbid Congestive Heart Failure, Coronary History: Artery Disease, Hypertension, Type II Diabetes, Gout, Neuropathy Photos Photo Uploaded By: Elpidio Eric on 10/18/2014 16:37:17 Wound Measurements Length: (cm) 0.9 Width: (cm) 0.9 Depth: (cm) 0.1 Area: (cm) 0.636 Volume: (cm) 0.064 % Reduction in Area: 49.4% % Reduction in Volume: 49.2% Epithelialization: Large (67-100%) Tunneling: No Undermining: No Wound Description Classification: Partial Thickness Foul O Diabetic Severity (Wagner): Grade 1 Wound Margin: Distinct, outline attached Exudate Amount: Small Exudate Type: Serous Exudate Color: amber dor After Cleansing: No Wound Bed Granulation Amount: Large (67-100%) Exposed Structure Longoria, Zyrus E. ([id number]) Granulation Quality: Red, Hyper-granulation Fascia Exposed: No Necrotic Amount: None Present (0%) Fat Layer Exposed:  No Tendon Exposed: No Muscle Exposed: No Joint Exposed: No Bone Exposed: No Limited to Skin Breakdown Periwound Skin Texture Texture Color No Abnormalities Noted: No No Abnormalities Noted: No Callus: No Atrophie Blanche: No Crepitus: No Cyanosis: No Excoriation: No Ecchymosis: No Fluctuance: No Erythema: No Friable: No Hemosiderin Staining: No Induration: No Mottled: No Localized  Edema: Yes Pallor: No Rash: No Rubor: No Scarring: No Temperature / Pain Moisture Temperature: No Abnormality No Abnormalities Noted: No Tenderness on Palpation: Yes Dry / Scaly: No Maceration: No Moist: Yes Wound Preparation Ulcer Cleansing: Other: soap and water, Topical Anesthetic Applied: Other: lidocaine 4%, Treatment Notes Wound #1 (Right, Medial Lower Leg) 1. Cleansed with: Cleanse wound with antibacterial soap and water 3. Peri-wound Care: Moisturizing lotion 4. Dressing Applied: Mepitel 5. Secondary Dressing Applied Dry Gauze 7. Secured with 2 Layer Lite Compression System - Right Lower Extremity Electronic Signature(s) Signed: 10/18/2014 1:16:55 PM By: Elpidio EricAfful, Rita BSN, RN Previous Signature: 10/18/2014 1:15:42 PM Version By: Elpidio EricAfful, Rita BSN, RN [address]PAR[address], Beatrix FettersFREDDIE E. ([id number]) Entered By: Elpidio EricAfful, Rita on 10/18/2014 13:16:55 Pablo LedgerPARKER, Hiroki E. ([id number]) -------------------------------------------------------------------------------- Vitals Details Patient Name: [name], Thinh E. Date of Service: 10/18/2014 1:00 PM Medical Record Number: [id number] Patient Account Number: [id number] Date of Birth/Sex: 28-Dec-1945 [age] y.o. Male) Treating RN: Clover MealyAfful, RN, BSN, Rita Primary Care Physician: Daniel NonesKLEIN, BERT Other Clinician: Referring Physician: Daniel NonesKLEIN, BERT Treating Physician/Extender: Rudene ReBritto, Errol Weeks in Treatment: 14 Vital Signs Time Taken: 13:10 Temperature (F): 98.1 Height (in): 72 Pulse (bpm): 85 Weight (lbs): 215 Respiratory Rate (breaths/min): 16 Body Mass Index  (BMI): 29.2 Blood Pressure (mmHg): 99/62 Reference Range: 80 - 120 mg / dl Electronic Signature(s) Signed: 10/18/2014 1:11:05 PM By: Elpidio EricAfful, Rita BSN, RN Entered By: Elpidio EricAfful, Rita on 10/18/2014 13:11:05

## 2014-10-25 ENCOUNTER — Encounter: Payer: Medicare Other | Admitting: Surgery

## 2014-10-25 DIAGNOSIS — L97911 Non-pressure chronic ulcer of unspecified part of right lower leg limited to breakdown of skin: Secondary | ICD-10-CM | POA: Diagnosis not present

## 2014-10-26 NOTE — Progress Notes (Signed)
TYWON, NIDAY ([id number]) Visit Report for 10/25/2014 Chief Complaint Document Details Patient Name: Nicholas Leonard, Nicholas Leonard. Date of Service: 10/25/2014 1:45 PM Medical Record Number: [id number] Patient Account Number: [id number] Date of Birth/Sex: [birth year] (69 y.o. Male) Treating RN: Primary Care Physician: Daniel Nones Other Clinician: Referring Physician: Daniel Nones Treating Physician/Extender: Rudene Re in Treatment: 15 Information Obtained from: Patient Chief Complaint Patient presents to the wound care center for a consult due non healing wound ulcer on the right lower limb which was noted to be there for about 3 weeks now. he is also had swelling of both lower extremities for about 2 months now. Electronic Signature(s) Signed: 10/25/2014 2:26:44 PM By: Evlyn Kanner MD, FACS Entered By: Evlyn Kanner on 10/25/2014 14:26:44 Nicholas Leonard ([id number]) -------------------------------------------------------------------------------- Cellular or Tissue Based Product Details Patient Name: Nicholas Leonard, Nicholas Leonard. Date of Service: 10/25/2014 1:45 PM Medical Record Number: [id number] Patient Account Number: [id number] Date of Birth/Sex: 1945/08/11 (69 y.o. Male) Treating RN: Primary Care Physician: Daniel Nones Other Clinician: Referring Physician: Daniel Nones Treating Physician/Extender: Rudene Re in Treatment: 15 Cellular or Tissue Based Wound #1 Right,Medial Lower Leg Product Type Applied to: Performed By: Physician Tristan Schroeder., MD Cellular or Tissue Based Epifix Product Type: Time-Out Taken: Yes Location: trunk / arms / legs Wound Size (sq cm): 2.85 Product Size (sq cm): 1.4 Waste Size (sq cm): 0 Amount of Product Applied (sq cm): 1.4 Lot #: [id number] Order #: [id number] Expiration Date: 07/12/2019 Fenestrated: No Reconstituted: Yes Solution Type: nacl Solution Amount: 15ml Lot #: [id number] Solution Expiration 05/13/2016 Date: Secured:  Yes Secured With: Steri-Strips Dressing Applied: Yes Primary Dressing: mepitel Procedural Pain: 0 Post Procedural Pain: 0 Response to Treatment: Procedure was tolerated well Electronic Signature(s) Signed: 10/25/2014 2:29:36 PM By: Evlyn Kanner MD, FACS Entered By: Evlyn Kanner on 10/25/2014 14:29:36 Nicholas Leonard ([id number]) -------------------------------------------------------------------------------- HPI Details Patient Name: Nicholas Leonard. Date of Service: 10/25/2014 1:45 PM Medical Record Number: [id number] Patient Account Number: [id number] Date of Birth/Sex: 08-13-1945 (69 y.o. Male) Treating RN: Primary Care Physician: Daniel Nones Other Clinician: Referring Physician: Daniel Nones Treating Physician/Extender: Rudene Re in Treatment: 15 History of Present Illness HPI Description: 69 year old gentleman who is recently discharged from [facility] and was admitted between 3 18-3/22 by his primary care physician Dr. Daniel Nones. He has adult onset diabetes and was recently admitted for acute on chronic left-sided systolic congestive heart failure, acute on chronic renal failure, coronary artery disease, ischemic cardiomyopathy, ventricular fibrillation status post AICD implantation, history of atrial fibrillation on Coumadin therapy, hypothyroidism, history of venous stasis ulcer, pulmonary embolism with deep vein thrombosis on chronic Coumadin therapy, osteoarthritis and gout. He does not smoke any longer and occasionally has alcohol. as not been compliant with getting his blood sugars checked and does not know the last time he had a hemoglobin A1c done. Does not recall when the last ultrasound of his lower extremities was done but he does say that he has had clots in his legs. 07/30/2014 the patient is back to [place] after a recent admission and discharge from the hospital and he was there between 07/19/2014 and 07/26/2014. Reviewed these notes  and his final diagnosis was #1 elevated INR, #2 acute on chronic left-sided systolic congestive heart failure, #3 hypothyroidism, #4 gout, #5 chronic kidney disease stage III, #6 acute renal failure, #7 carotid artery disease, #8 history of pulmonary embolism and DVT, #9 adult onset diabetes mellitus. During the admission he was corrected with giving vitamin  K and due to his difficulty with Coumadin he was changed over to Eliquis. The patient also was found to have diffuse edema and there was evidence of ascites on abdominal ultrasound and was placed on a Lasix drip by nephrologist. His just discharge medications were noted by me. they have been using Santyl on his right lower extremity wound and he has been doing this dressing as often as the nurse comes in. he has also developed a ulcer of the left thigh and this has been there for about 2 weeks and has some necrosis of skin over there. 08/23/2014 -- he recently saw the vascular surgeon Dr. Gilda Crease and has had both an arterial and duplex venous study done. Though we do not have the official report yet the patient tells me that his arteries were okay and there was no problem with the blood flow. 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 08/30/2014 -- his vascular surgery has been scheduled for Tuesday, May 24 and he will come back and see [place] if he needs to soon after that for a rewrap. Addendum: We have finally received his transcript of his office visit with Dr. Gilda Crease on 08/22/2014 and note that the duplex US of the lower extremity arterial system bilaterally SFA are patent, with diffuse tibial disease with monophasic tibial signals on the right and biphasic signals on the left. Duplex Venous ultrasound of the lower extremity shows normal deep venous system, superficial reflux is present over a short segment of the GSV in the region of the ulcer. However  the GSV from the knees to the groin is competent. The recommendations were for the patient to have angiography of the lower extremity with the hope of ZYEIR, DYMEK. ([id number]) intervention for limb salvage. Once the arterial compromise has been addressed then the venous portion can be more effectively treated. 09/05/2014 - s/p RLE angiogram without intervention 09/03/2014 by Dr Gilda Crease. No new complaints today. No significant pain. No fever or chills. Minimal drainage. 09/13/2014 -- On 09/03/2014 o Procedure(s) Performed: 1. Abdominal aortogram 2. Right lower extremity distal runoff third order catheter placement 3. Additional right third order catheter placement as per the patient the vascular surgeon will see him back in 6 months and no other procedures planned for in the near future. 09/27/2014 -- He is here for his first application of a EpiFix which was approved by his insurance company. 10/10/2014 -- He is for his second application of a Epifix. He is doing fine and has no complaints 10/25/2014 he is here for his third application of epi fix and is otherwise doing fine. Electronic Signature(s) Signed: 10/25/2014 2:27:23 PM By: Evlyn Kanner MD, FACS Entered By: Evlyn Kanner on 10/25/2014 14:27:22 Nicholas Leonard ([id number]) -------------------------------------------------------------------------------- Physical Exam Details Patient Name: Nicholas Leonard, Nicholas Leonard. Date of Service: 10/25/2014 1:45 PM Medical Record Number: [id number] Patient Account Number: [id number] Date of Birth/Sex: 1946-04-01 (69 y.o. Male) Treating RN: Primary Care Physician: Daniel Nones Other Clinician: Referring Physician: Daniel Nones Treating Physician/Extender: Rudene Re in Treatment: 15 Constitutional . Pulse regular. Respirations normal and unlabored. Afebrile. . Eyes Nonicteric. Reactive to light. Ears, Nose, Mouth, and Throat Lips, teeth, and gums WNL.Marland Kitchen Moist mucosa without lesions  . Neck supple and nontender. No palpable supraclavicular or cervical adenopathy. Normal sized without goiter. Respiratory WNL. No retractions.. Cardiovascular Pedal Pulses WNL. has +1 pitting edema both lower extremities. Lymphatic No adneopathy. No adenopathy. No  adenopathy. Musculoskeletal Adexa without tenderness or enlargement.. Digits and nails w/o clubbing, cyanosis, infection, petechiae, ischemia, or inflammatory conditions.. Integumentary (Hair, Skin) No suspicious lesions. No crepitus or fluctuance. No peri-wound warmth or erythema. No masses.Marland Kitchen. Psychiatric Judgement and insight Intact.. No evidence of depression, anxiety, or agitation.. Notes the wound looks very clean partially epithelialized and needs his next layer of a epifix Electronic Signature(s) Signed: 10/25/2014 2:28:09 PM By: Evlyn KannerBritto, Thai Hemrick MD, FACS Entered By: Evlyn KannerBritto, Darshawn Boateng on 10/25/2014 14:28:09 Nicholas Leonard, Nicholas E. ([id number]) -------------------------------------------------------------------------------- Physician Orders Details Patient Name: Nicholas Leonard, Nicholas E. Date of Service: 10/25/2014 1:45 PM Medical Record Number: [id number] Patient Account Number: [id number] Date of Birth/Sex: 1945-09-16 [age] y.o. Male) Treating RN: Huel CoventryWoody, Kim Primary Care Physician: Daniel NonesKLEIN, BERT Other Clinician: Referring Physician: Daniel NonesKLEIN, BERT Treating Physician/Extender: Rudene ReBritto, Yenni Carra Weeks in Treatment: 15 Verbal / Phone Orders: No Diagnosis Coding Wound Cleansing Wound #1 Right,Medial Lower Leg o Clean wound with Normal Saline. Anesthetic Wound #1 Right,Medial Lower Leg o Topical Lidocaine 4% cream applied to wound bed prior to debridement Secondary Dressing o ABD pad Follow-up Appointments Wound #1 Right,Medial Lower Leg o Return Appointment in 1 week. Edema Control Wound #1 Right,Medial Lower Leg o 2 Layer Lite Compression System - Right Lower Extremity Advanced Therapies Wound #1 Right,Medial Lower Leg o  EpiFix application in clinic; including contact layer, fixation with steri strips, dry gauze and cover dressing. Electronic Signature(s) Signed: 10/25/2014 4:32:00 PM By: Evlyn KannerBritto, Lisamarie Coke MD, FACS Signed: 10/25/2014 5:11:11 PM By: Elliot GurneyWoody, RN, BSN, Kim RN, BSN Entered By: Elliot GurneyWoody, RN, BSN, Kim on 10/25/2014 14:29:19 Jimmey RalphRKER, Nicholas FettersFREDDIE E. ([id number]) -------------------------------------------------------------------------------- Problem List Details Patient Name: Nicholas LedgerRKER, Hektor E. Date of Service: 10/25/2014 1:45 PM Medical Record Number: [id number] Patient Account Number: [id number] Date of Birth/Sex: 1945-09-16 5(68 y.o. Male) Treating RN: Primary Care Physician: Daniel NonesKLEIN, BERT Other Clinician: Referring Physician: Daniel NonesKLEIN, BERT Treating Physician/Extender: Rudene ReBritto, Haydn Cush Weeks in Treatment: 15 Active Problems ICD-10 Encounter Code Description Active Date Diagnosis E11.622 Type 2 diabetes mellitus with other skin ulcer 07/09/2014 Yes I50.21 Acute systolic (congestive) heart failure 07/09/2014 Yes I80.291 Phlebitis and thrombophlebitis of other deep vessels of 07/09/2014 Yes right lower extremity I87.311 Chronic venous hypertension (idiopathic) with ulcer of 07/09/2014 Yes right lower extremity 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 ([id number]) Jimmey RalphPARKER, Nicholas FettersFREDDIE E. ([id number]) -------------------------------------------------------------------------------- Progress Note Details Patient Name: Nicholas Leonard, Nicholas E. Date of Service: 10/25/2014 1:45 PM Medical Record Number: [id number] Patient Account  Number: [id number] Date of Birth/Sex: 1945-09-16 31(68 y.o. Male) Treating RN: Primary Care Physician: Daniel NonesKLEIN, BERT Other Clinician: Referring Physician: Daniel NonesKLEIN, BERT Treating Physician/Extender: Rudene ReBritto, Jaedan Schuman Weeks in Treatment: 15 Subjective Chief Complaint Information obtained from Patient Patient presents to the wound care center for a consult due non healing wound ulcer on the right lower limb which was noted to be there for about 3 weeks now. he is also had swelling of both lower extremities for about 2 months now. History of Present Illness (HPI) [age] year old gentleman who is recently discharged from [facility] and was admitted between 3 18-3/22 by his primary care physician Dr. Daniel NonesBert Klein. He has adult onset diabetes and was recently admitted for acute on chronic left-sided systolic congestive heart failure, acute on chronic  renal failure, coronary artery disease, ischemic cardiomyopathy, ventricular fibrillation status post AICD implantation, history of atrial fibrillation on Coumadin therapy, hypothyroidism, history of venous stasis ulcer, pulmonary embolism with deep vein thrombosis on chronic Coumadin therapy, osteoarthritis and gout. He does not smoke any longer and occasionally has alcohol. as not been compliant with getting his blood sugars checked and does not know the last time he had a hemoglobin A1c done. Does not recall when the last ultrasound of his lower extremities was done but he does say that he has had clots in his legs. 07/30/2014 the patient is back to [place] after a recent admission and discharge from the hospital and he was there between 07/19/2014 and 07/26/2014. Reviewed these notes and his final diagnosis was #1 elevated INR, #2 acute on chronic left-sided systolic congestive heart failure, #3 hypothyroidism, #4 gout, #5 chronic kidney disease stage III, #6 acute renal failure, #7 carotid artery disease, #8 history of pulmonary embolism and DVT,  #9 adult onset diabetes mellitus. During the admission he was corrected with giving vitamin K and due to his difficulty with Coumadin he was changed over to Eliquis. The patient also was found to have diffuse edema and there was evidence of ascites on abdominal ultrasound and was placed on a Lasix drip by nephrologist. His just discharge medications were noted by me. they have been using Santyl on his right lower extremity wound and he has been doing this dressing as often as the nurse comes in. he has also developed a ulcer of the left thigh and this has been there for about 2 weeks and has some necrosis of skin over there. 08/23/2014 -- he recently saw the vascular surgeon Dr. Gilda Crease and has had both an arterial and duplex venous study done. Though we do not have the official report yet the patient tells me that his arteries were okay and there was no problem with the blood flow. 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 08/30/2014 -- his vascular surgery has been scheduled for Tuesday, May 24 and he will come back and see JALAN, FARISS ([id number]) [place] if he needs to soon after that for a rewrap. Addendum: We have finally received his transcript of his office visit with Dr. Gilda Crease on 08/22/2014 and note that the duplex US of the lower extremity arterial system bilaterally SFA are patent, with diffuse tibial disease with monophasic tibial signals on the right and biphasic signals on the left. Duplex Venous ultrasound of the lower extremity shows normal deep venous system, superficial reflux is present over a short segment of the GSV in the region of the ulcer. However the GSV from the knees to the groin is competent. The recommendations were for the patient to have angiography of the lower extremity with the hope of intervention for limb salvage. Once the arterial compromise has been addressed then  the venous portion can be more effectively treated. 09/05/2014 - s/p RLE angiogram without intervention 09/03/2014 by Dr Gilda Crease. No new complaints today. No significant pain. No fever or chills. Minimal drainage. 09/13/2014 -- On 09/03/2014 Procedure(s) Performed: 1. Abdominal aortogram 2. Right lower extremity distal runoff third order catheter placement 3. Additional right third order catheter placement as per the patient the vascular surgeon will see him back in 6 months and no other procedures planned for in the near future. 09/27/2014 -- He is here for his first application of a EpiFix which  was approved by his insurance company. 10/10/2014 -- He is for his second application of a Epifix. He is doing fine and has no complaints 10/25/2014 he is here for his third application of epi fix and is otherwise doing fine. Objective Constitutional Pulse regular. Respirations normal and unlabored. Afebrile. Vitals Time Taken: 1:57 PM, Height: 72 in, Weight: 215 lbs, BMI: 29.2, Temperature: 97.9 F, Pulse: 89 bpm, Respiratory Rate: 18 breaths/min, Blood Pressure: 111/68 mmHg. Eyes Nonicteric. Reactive to light. Ears, Nose, Mouth, and Throat Lips, teeth, and gums WNL.Marland Kitchen Moist mucosa without lesions . Neck supple and nontender. No palpable supraclavicular or cervical adenopathy. Normal sized without goiter. Respiratory WNL. No retractions.Marland Kitchen Nicholas Leonard, Nicholas Leonard ([id number]) Cardiovascular Pedal Pulses WNL. has +1 pitting edema both lower extremities. Lymphatic No adneopathy. No adenopathy. No adenopathy. Musculoskeletal Adexa without tenderness or enlargement.. Digits and nails w/o clubbing, cyanosis, infection, petechiae, ischemia, or inflammatory conditions.Marland Kitchen Psychiatric Judgement and insight Intact.. No evidence of depression, anxiety, or agitation.. General Notes: the wound looks very clean partially epithelialized and needs his next layer of a epifix Integumentary (Hair, Skin) No  suspicious lesions. No crepitus or fluctuance. No peri-wound warmth or erythema. No masses.. Wound #1 status is Open. Original cause of wound was Gradually Appeared. The wound is located on the Right,Medial Lower Leg. The wound measures 1.9cm length x 1.5cm width x 0.1cm depth; 2.238cm^2 area and 0.224cm^3 volume. The wound is limited to skin breakdown. There is a small amount of serous drainage noted. The wound margin is distinct with the outline attached to the wound base. There is large (67-100%) red granulation within the wound bed. There is no necrotic tissue within the wound bed. The periwound skin appearance exhibited: Localized Edema, Moist. The periwound skin appearance did not exhibit: Callus, Crepitus, Excoriation, Fluctuance, Friable, Induration, Rash, Scarring, Dry/Scaly, Maceration, Atrophie Blanche, Cyanosis, Ecchymosis, Hemosiderin Staining, Mottled, Pallor, Rubor, Erythema. Periwound temperature was noted as No Abnormality. The periwound has tenderness on palpation. 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 ([id number]) After application of a Epifix we have put the Mepitel and a 2 layer compression wrap. He will come for a wound check next week. He is working on his edema with appropriate diuretics and elevation. Procedures Wound #1 Wound #1 is an Arterial Insufficiency Ulcer located on the Right,Medial Lower Leg. A skin graft procedure using a bioengineered skin  substitute/cellular or tissue based product was performed by Rodderick Holtzer, Ignacia Felling., MD. Epifix was applied and secured with Steri-Strips. 1.4 sq cm of product was utilized and 0 sq cm was wasted. Post Application, mepitel was applied. A Time Out was conducted prior to the start of the procedure. The procedure was tolerated well with a pain level of 0 throughout and a pain level of 0 following the procedure. Plan Wound Cleansing: Wound #1 Right,Medial Lower Leg: Clean wound with Normal Saline. Anesthetic: Wound #1 Right,Medial Lower Leg: Topical Lidocaine 4% cream applied to wound bed prior to debridement Secondary Dressing: ABD pad Follow-up Appointments: Wound #1 Right,Medial Lower Leg: Return Appointment in 1 week. Edema Control: Wound #  1 Right,Medial Lower Leg: 2 Layer Lite Compression System - Right Lower Extremity Advanced Therapies: Wound #1 Right,Medial Lower Leg: EpiFix application in clinic; including contact layer, fixation with steri strips, dry gauze and cover dressing. After application of a Epifix we have put the Mepitel and a 2 layer compression wrap. He will come for a wound check next week. He is working on his edema with appropriate diuretics and elevation. DEMORIO, SEELEY ([id number]) Electronic Signature(s) Signed: 10/25/2014 2:31:41 PM By: Evlyn Kanner MD, FACS Entered By: Evlyn Kanner on 10/25/2014 14:31:41 Nicholas Leonard ([id number]) -------------------------------------------------------------------------------- SuperBill Details Patient Name: Pavo, WENZLICK. Date of Service: 10/25/2014 Medical Record Number: [id number] Patient Account Number: [id number] Date of Birth/Sex: 1945-10-01 (69 y.o. 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 L97.122 Non-pressure chronic ulcer of left thigh with fat layer exposed I70.232 Atherosclerosis of native arteries of right leg with ulceration of calf Facility Procedures CPT4 Code Description: [id number] (Facility Use Only) Q4131 o Epifix o Per 1 SQ CM ICD-10 Description Diagnosis E11.622 Type 2 diabetes mellitus with other skin ulcer L97.122 Non-pressure chronic ulcer of left thigh with fat layer exp Modifier: osed Quantity: 1.4 CPT4 Code Description: [id number] - SKIN SUB GRAFT TRNK/ARM/LEG ICD-10 Description Diagnosis E11.622 Type 2 diabetes mellitus with other skin ulcer I87.311 Chronic venous hypertension (idiopathic) with ulcer of righ Modifier: t lower ext Quantity: 1 remity Physician Procedures CPT4 Code Description: [id number] 15271 - WC PHYS SKIN SUB GRAFT TRNK/ARM/LEG ICD-10 Description Diagnosis E11.622 Type 2 diabetes mellitus with other skin ulcer I87.311 Chronic venous hypertension (idiopathic) with ulcer of righ TARVARES, LANT  ([id number]) Modifier: t lower ext Quantity: 1 remity Electronic Signature(s) Signed: 10/25/2014 2:32:35 PM By: Evlyn Kanner MD, FACS Previous Signature: 10/25/2014 2:32:16 PM Version By: Evlyn Kanner MD, FACS Entered By: Evlyn Kanner on 10/25/2014 14:32:35

## 2014-10-26 NOTE — Progress Notes (Addendum)
Nicholas Leonard ([id number]) Visit Report for 10/25/2014 Arrival Information Details Patient Name: Nicholas Leonard, Nicholas Leonard. Date of Service: 10/25/2014 1:45 PM Medical Record Number: [id number] Patient Account Number: [id number] Date of Birth/Sex: 10/19/1945 (69 y.o. Male) Treating RN: Huel Coventry Primary Care Physician: Daniel Nones Other Clinician: Referring Physician: Daniel Nones Treating Physician/Extender: Rudene Re in Treatment: 15 Visit Information History Since Last Visit Added or deleted any medications: No Patient Arrived: Ambulatory Any new allergies or adverse reactions: No Arrival Time: 13:55 Had a fall or experienced change in No Accompanied By: self activities of daily living that may affect Transfer Assistance: None risk of falls: Patient Identification Verified: Yes Signs or symptoms of abuse/neglect since last No Secondary Verification Process Yes visito Completed: Hospitalized since last visit: No Patient Requires Transmission- No Has Dressing in Place as Prescribed: Yes Based Precautions: Has Compression in Place as Prescribed: Yes Patient Has Alerts: Yes Pain Present Now: No Patient Alerts: Patient on Blood Thinner Type II Diabetic ABI (L) 1.75 (R) 1.55 Electronic Signature(s) Signed: 11/14/2014 3:56:35 PM By: Elliot Gurney, RN, BSN, Kim RN, BSN Previous Signature: 10/25/2014 5:11:11 PM Version By: Elliot Gurney, RN, BSN, Kim RN, BSN Entered By: Elliot Gurney, RN, BSN, Kim on 11/01/2014 07:56:12 Armentor, [name] ([id number]) -------------------------------------------------------------------------------- Encounter Discharge Information Details Patient Name: Nicholas Leonard, Nicholas Leonard. Date of Service: 10/25/2014 1:45 PM Medical Record Number: [id number] Patient Account Number: [id number] Date of Birth/Sex: 04/05/1946 (69 y.o. Male) Treating RN: Huel Coventry Primary Care Physician: Daniel Nones Other Clinician: Referring Physician: Daniel Nones Treating Physician/Extender: Rudene Re in Treatment: 15 Encounter Discharge Information Items Discharge Pain Level: 0 Discharge Condition: Stable Ambulatory Status: Ambulatory Discharge Destination: Home Private Transportation: Auto Accompanied By: self Schedule Follow-up Appointment: Yes Medication Reconciliation completed and Yes provided to Patient/Care [name]: Clinical Summary of Care: Electronic Signature(s) Signed: 10/25/2014 5:11:11 PM By: Elliot Gurney, RN, BSN, Kim RN, BSN Entered By: Elliot Gurney, RN, BSN, Kim on 10/25/2014 14:28:10 Nicholas Leonard ([id number]) -------------------------------------------------------------------------------- Lower Extremity Assessment Details Patient Name: Nicholas Leonard, Nicholas Leonard. Date of Service: 10/25/2014 1:45 PM Medical Record Number: [id number] Patient Account Number: [id number] Date of Birth/Sex: March 14, 1946 (69 y.o. Male) Treating RN: Huel Coventry Primary Care Physician: Daniel Nones Other Clinician: Referring Physician: Daniel Nones Treating Physician/Extender: Rudene Re in Treatment: 15 Edema Assessment Assessed: [Left: No] [Right: No] E[Left: dema] [Right: :] Calf Left: Right: Point of Measurement: 39 cm From Medial Instep cm 38 cm Ankle Left: Right: Point of Measurement: 12 cm From Medial Instep cm 24 cm Vascular Assessment Pulses: Posterior Tibial Dorsalis Pedis Palpable: [Right:Yes] Extremity colors, hair growth, and conditions: Extremity Color: [Right:Hyperpigmented] Hair Growth on Extremity: [Right:No] Temperature of Extremity: [Right:Warm] Capillary Refill: [Right:< 3 seconds] Toe Nail Assessment Left: Right: Thick: Yes Discolored: Yes Deformed: Yes Improper Length and Hygiene: Yes Electronic Signature(s) Signed: 10/25/2014 5:11:11 PM By: Elliot Gurney, RN, BSN, Kim RN, BSN Entered By: Elliot Gurney, RN, BSN, Kim on 10/25/2014 14:01:41 Nicholas Leonard ([id number]) -------------------------------------------------------------------------------- Multi  Wound Chart Details Patient Name: Nicholas Leonard. Date of Service: 10/25/2014 1:45 PM Medical Record Number: [id number] Patient Account Number: [id number] Date of Birth/Sex: January 08, 1946 (69 y.o. 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] Comorbid History: [1:Congestive Heart Failure, N/A Coronary Artery Disease, Hypertension, Type II Diabetes, Gout, Neuropathy] Date Acquired: [1:06/10/2014] [N/A:N/A] Weeks of Treatment: [1:15] [N/A:N/A] Wound Status: [1:Open] [N/A:N/A] Measurements L x W x D 1.9x1.5x0.1 [N/A:N/A] (cm) Area (cm) : [1:2.238] [N/A:N/A] Volume (cm) : [1:0.224] [N/A:N/A] % Reduction in Area: [1:-78.00%] [N/A:N/A] % Reduction in Volume: -77.80% [N/A:N/A] Classification: [1:Partial Thickness] [N/A:N/A] HBO Classification: [1:Grade 1] [N/A:N/A] Exudate Amount: [1:Small] [N/A:N/A] Exudate Type: [1:Serous] [N/A:N/A] Exudate Color: [1:amber] [N/A:N/A] Wound Margin: [1:Distinct, outline attached N/A] Granulation Amount: [1:Large (67-100%)] [N/A:N/A] Granulation Quality: [1:Red, Hyper-granulation] [N/A:N/A] Necrotic Amount: [1:None Present (0%)] [N/A:N/A] Exposed Structures: [N/A:N/A] Fascia: No Fat: No Tendon: No Muscle: No Joint: No Bone: No Limited to Skin Breakdown Epithelialization: Large (67-100%) N/A N/A Periwound Skin Texture: Edema: Yes N/A N/A Excoriation: No Induration: No Callus:  No Crepitus: No Fluctuance: No Friable: No Rash: No Scarring: No Periwound Skin Moist: Yes N/A N/A Moisture: Maceration: No Dry/Scaly: No Periwound Skin Color: Atrophie Blanche: No N/A N/A Cyanosis: No Ecchymosis: No Erythema: No Hemosiderin Staining: No Mottled: No Pallor: No Rubor: No Temperature: No Abnormality N/A N/A Tenderness on Yes N/A N/A Palpation: Wound Preparation: Ulcer Cleansing: Other: N/A N/A soap and water Topical Anesthetic Applied: Other: lidocaine 4% Treatment Notes Electronic Signature(s) Signed: 10/25/2014 5:11:11 PM By: Elliot Gurney, RN, BSN, Kim RN, BSN Entered By: Elliot Gurney, RN, BSN, Kim on 10/25/2014 14:07:31 Nicholas Leonard ([id number]) -------------------------------------------------------------------------------- Multi-Disciplinary Care Plan Details Patient Name: Nicholas Leonard, Nicholas Leonard. Date of Service: 10/25/2014 1:45 PM Medical Record Number: [id number] Patient Account Number: [id number] Date of Birth/Sex: 07-15-1945 (69 y.o. Male) Treating RN: Huel Coventry Primary Care Physician: Daniel Nones Other Clinician: Referring Physician: Daniel Nones Treating Physician/Extender: Rudene Re in Treatment: 15 Active Inactive Nutrition Nursing Diagnoses: Impaired glucose control: actual or potential Potential for alteratiion in Nutrition/Potential for imbalanced nutrition Goals: Patient/caregiver verbalizes understanding of need to maintain therapeutic glucose control per primary care physician Date Initiated: 07/09/2014 Goal Status: Active Patient/caregiver will maintain therapeutic glucose control Date Initiated: 07/09/2014 Goal Status: Active Interventions: Assess patient nutrition upon admission and as needed per policy Provide education on elevated blood sugars and impact on wound healing Provide education on nutrition Treatment Activities: Education provided on Nutrition : 07/09/2014 Notes: Orientation to the Wound Care Program Nursing  Diagnoses: Knowledge deficit related to the wound healing center program Goals: Patient/caregiver will verbalize understanding of the Wound Healing Center Program Date Initiated: 07/09/2014 Goal Status: Active Interventions: Provide education on orientation to the wound center Nicholas Leonard, Nicholas Leonard. ([id number]) Notes: Venous Leg Ulcer Nursing Diagnoses: Knowledge deficit related to disease process and management Potential for venous Insuffiency (use before diagnosis confirmed) Goals: Patient will maintain optimal edema control Date Initiated: 07/09/2014 Goal Status: Active Verify adequate tissue perfusion prior to therapeutic compression application Date Initiated: 07/09/2014 Goal Status: Active Interventions: Assess peripheral edema status every visit. Compression as ordered Provide education on venous insufficiency Treatment Activities: Test ordered outside of clinic : 10/25/2014 Therapeutic compression applied : 10/25/2014 Notes: Wound/Skin Impairment Nursing Diagnoses: Knowledge deficit related to ulceration/compromised skin integrity Goals: Patient/caregiver will verbalize understanding of skin care regimen Date Initiated: 07/09/2014 Goal Status: Active Ulcer/skin breakdown will heal within 14 weeks Date Initiated: 07/09/2014 Goal Status: Active Interventions: Assess patient/caregiver ability to obtain necessary supplies Assess patient/caregiver ability to perform ulcer/skin care regimen upon admission and as needed Assess ulceration(s) every visit Provide education on ulcer and skin care Nicholas Leonard, Nicholas Leonard ([id number]) Treatment Activities: Patient referred to home care :  10/25/2014 Skin care regimen initiated : 10/25/2014 Topical wound management initiated : 10/25/2014 Notes: Electronic Signature(s) Signed: 10/25/2014 5:11:11 PM By: Elliot Gurney, RN, BSN, Kim RN, BSN Entered By: Elliot Gurney, RN, BSN, Kim on 10/25/2014 14:06:57 Nicholas Leonard  ([id number]) -------------------------------------------------------------------------------- Pain Assessment Details Patient Name: Nicholas Leonard, Nicholas Leonard. Date of Service: 10/25/2014 1:45 PM Medical Record Number: [id number] Patient Account Number: [id number] Date of Birth/Sex: 1945/05/28 (69 y.o. Male) Treating RN: Huel Coventry Primary Care Physician: Daniel Nones Other Clinician: Referring Physician: Daniel Nones Treating Physician/Extender: Rudene Re in Treatment: 15 Active Problems Location of Pain Severity and Description of Pain Patient Has Paino No Site Locations Pain Management and Medication Current Pain Management: Electronic Signature(s) Signed: 10/25/2014 5:11:11 PM By: Elliot Gurney, RN, BSN, Kim RN, BSN Entered By: Elliot Gurney, RN, BSN, Kim on 10/25/2014 13:57:55 Nicholas Leonard ([id number]) -------------------------------------------------------------------------------- Patient/Caregiver Education Details Patient Name: Nicholas Leonard Date of Service: 10/25/2014 1:45 PM Medical Record Number: [id number] Patient Account Number: [id number] Date of Birth/Gender: January 12, 1946 (69 y.o. Male) Treating RN: Huel Coventry Primary Care Physician: Daniel Nones Other Clinician: Referring Physician: Daniel Nones Treating Physician/Extender: Rudene Re in Treatment: 15 Education Assessment Education Provided To: Patient Education Topics Provided Venous: Wound/Skin Impairment: Handouts: Caring for Your Ulcer, Other: Epifix applied Methods: Demonstration, Explain/Verbal Responses: State content correctly Electronic Signature(s) Signed: 10/25/2014 5:11:11 PM By: Elliot Gurney, RN, BSN, Kim RN, BSN Entered By: Elliot Gurney, RN, BSN, Kim on 10/25/2014 14:28:47 Nicholas Leonard ([id number]) -------------------------------------------------------------------------------- Wound Assessment Details Patient Name: Nicholas Leonard, Nicholas Leonard. Date of Service: 10/25/2014 1:45 PM Medical Record Number:  [id number] Patient Account Number: [id number] Date of Birth/Sex: 10-19-1945 (69 y.o. Male) Treating RN: Huel Coventry Primary Care Physician: Daniel Nones Other Clinician: Referring Physician: Daniel Nones Treating Physician/Extender: Rudene Re in Treatment: 15 Wound Status Wound Number: 1 Primary Arterial Insufficiency Ulcer Etiology: Wound Location: Right Lower Leg - Medial Secondary Diabetic Wound/Ulcer of the Lower Wounding Event: Gradually Appeared Etiology: Extremity Date Acquired: 06/10/2014 Wound Open Weeks Of Treatment: 15 Status: Clustered Wound: No Comorbid Congestive Heart Failure, Coronary History: Artery Disease, Hypertension, Type II Diabetes, Gout, Neuropathy Photos Photo Uploaded By: Elliot Gurney, RN, BSN, Kim on 10/25/2014 17:47:03 Wound Measurements Length: (cm) 1.9 Width: (cm) 1.5 Depth: (cm) 0.1 Area: (cm) 2.238 Volume: (cm) 0.224 % Reduction in Area: -78% % Reduction in Volume: -77.8% Epithelialization: Large (67-100%) Wound Description Classification: Partial Thickness Foul O Diabetic Severity (Wagner): Grade 1 Wound Margin: Distinct, outline attached Exudate Amount: Small Exudate Type: Serous Exudate Color: amber dor After Cleansing: No Wound Bed Granulation Amount: Large (67-100%) Exposed Structure Nicholas Leonard, Nicholas E. ([id number]) Granulation Quality: Red, Hyper-granulation Fascia Exposed: No Necrotic Amount: None Present (0%) Fat Layer Exposed: No Tendon Exposed: No Muscle Exposed: No Joint Exposed: No Bone Exposed: No Limited to Skin Breakdown Periwound Skin Texture Texture Color No Abnormalities Noted: No No Abnormalities Noted: No Callus: No Atrophie Blanche: No Crepitus: No Cyanosis: No Excoriation: No Ecchymosis: No Fluctuance: No Erythema: No Friable: No Hemosiderin Staining: No Induration: No Mottled: No Localized Edema: Yes Pallor: No Rash: No Rubor: No Scarring: No Temperature / Pain Moisture Temperature: No  Abnormality No Abnormalities Noted: No Tenderness on Palpation: Yes Dry / Scaly: No Maceration: No Moist: Yes Wound Preparation Ulcer Cleansing: Other: soap and water, Topical Anesthetic Applied: Other: lidocaine 4%, Electronic Signature(s) Signed: 10/25/2014 5:11:11 PM By: Elliot Gurney, RN, BSN, Kim RN, BSN Entered By: Elliot Gurney, RN, BSN, Kim on 10/25/2014 14:06:50 Nicholas Leonard ([id number]) -------------------------------------------------------------------------------- Vitals Details Patient Name: Nicholas Leonard Date of Service: 10/25/2014  1:45 PM Medical Record Number: [id number] Patient Account Number: [id number] Date of Birth/Sex: 09/04/1945 [age] y.o. Male) Treating RN: Huel CoventryWoody, Kim Primary Care Physician: Daniel NonesKLEIN, BERT Other Clinician: Referring Physician: Daniel NonesKLEIN, BERT Treating Physician/Extender: Rudene ReBritto, Errol Weeks in Treatment: 15 Vital Signs Time Taken: 13:57 Temperature (F): 97.9 Height (in): 72 Pulse (bpm): 89 Weight (lbs): 215 Respiratory Rate (breaths/min): 18 Body Mass Index (BMI): 29.2 Blood Pressure (mmHg): 111/68 Reference Range: 80 - 120 mg / dl Electronic Signature(s) Signed: 10/25/2014 5:11:11 PM By: Elliot GurneyWoody, RN, BSN, Kim RN, BSN Entered By: Elliot GurneyWoody, RN, BSN, Kim on 10/25/2014 14:00:33

## 2014-11-01 ENCOUNTER — Encounter: Payer: Medicare Other | Admitting: Surgery

## 2014-11-01 DIAGNOSIS — L97911 Non-pressure chronic ulcer of unspecified part of right lower leg limited to breakdown of skin: Secondary | ICD-10-CM | POA: Diagnosis not present

## 2014-11-01 NOTE — Progress Notes (Addendum)
Nicholas Leonard, Nicholas Leonard ([id number]) Visit Report for 11/01/2014 Arrival Information Details Patient Name: Nicholas Leonard. Date of Service: 11/01/2014 1:00 PM Medical Record Number: [id number] Patient Account Number: [id number] Date of Birth/Sex: Sep 25, 1945 (69 y.o. Male) Treating RN: Clover Mealy, RN, BSN, [place] Sink Primary Care Physician: Daniel Nones Other Clinician: Referring Physician: Daniel Nones Treating Physician/Extender: Rudene Re in Treatment: 16 Visit Information History Since Last Visit Any new allergies or adverse reactions: No Patient Arrived: Ambulatory Had a fall or experienced change in No Arrival Time: 13:06 activities of daily living that may affect Accompanied By: self risk of falls: Transfer Assistance: None Signs or symptoms of abuse/neglect since last No Patient Requires Transmission- No visito Based Precautions: Hospitalized since last visit: No Patient Has Alerts: Yes Has Dressing in Place as Prescribed: Yes Patient Alerts: Patient on Blood Has Compression in Place as Prescribed: Yes Thinner Pain Present Now: No Type II Diabetic ABI (L) 1.75 (R) 1.55 Electronic Signature(s) Signed: 11/01/2014 1:09:58 PM By: Elpidio Eric BSN, RN Entered By: Elpidio Eric on 11/01/2014 13:09:58 Nicholas Leonard ([id number]) -------------------------------------------------------------------------------- Encounter Discharge Information Details Patient Name: Nicholas Leonard. Date of Service: 11/01/2014 1:00 PM Medical Record Number: [id number] Patient Account Number: [id number] Date of Birth/Sex: 01/11/1946 (69 y.o. Male) Treating RN: Clover Mealy, RN, BSN, [place] Sink Primary Care Physician: Daniel Nones Other Clinician: Referring Physician: Daniel Nones Treating Physician/Extender: Rudene Re in Treatment: 16 Encounter Discharge Information Items Discharge Pain Level: 0 Discharge Condition: Stable Ambulatory Status: Ambulatory Discharge Destination:  Home Private Transportation: Auto Accompanied By: self Schedule Follow-up Appointment: No Medication Reconciliation completed and No provided to Patient/Care Brigett Estell: Clinical Summary of Care: Electronic Signature(s) Signed: 11/01/2014 1:41:22 PM By: Elpidio Eric BSN, RN Entered By: Elpidio Eric on 11/01/2014 13:41:22 Nicholas Leonard ([id number]) -------------------------------------------------------------------------------- Lower Extremity Assessment Details Patient Name: Nicholas Leonard. Date of Service: 11/01/2014 1:00 PM Medical Record Number: [id number] Patient Account Number: [id number] Date of Birth/Sex: 1945-11-08 (69 y.o. Male) Treating RN: Clover Mealy, RN, BSN, [place] Primary Care Physician: Daniel Nones Other Clinician: Referring Physician: Daniel Nones Treating Physician/Extender: Rudene Re in Treatment: 16 Edema Assessment Assessed: [Left: No] [Right: No] E[Left: dema] [Right: :] Calf Left: Right: Point of Measurement: 39 cm From Medial Instep cm 37.8 cm Ankle Left: Right: Point of Measurement: 12 cm From Medial Instep cm 24.3 cm Vascular Assessment Claudication: Claudication Assessment [Right:None] Pulses: Posterior Tibial Dorsalis Pedis Palpable: [Right:Yes] Extremity colors, hair growth, and conditions: Extremity Color: [Right:Normal] Hair Growth on Extremity: [Right:No] Temperature of Extremity: [Right:Warm] Capillary Refill: [Right:< 3 seconds] Toe Nail Assessment Left: Right: Thick: Yes Discolored: Yes Deformed: No Improper Length and Hygiene: Yes Electronic Signature(s) Signed: 11/01/2014 1:15:15 PM By: Elpidio Eric BSN, RN Entered By: Elpidio Eric on 11/01/2014 13:15:15 Nicholas Leonard ([id number]) Nicholas Leonard ([id number]) -------------------------------------------------------------------------------- Multi-Disciplinary Care Plan Details Patient Name: Nicholas Leonard, Nicholas Leonard. Date of Service: 11/01/2014 1:00 PM Medical Record Number:  [id number] Patient Account Number: [id number] Date of Birth/Sex: [birth year] (69 y.o. Male) Treating RN: Huel Coventry Primary Care Physician: Daniel Nones Other Clinician: Referring Physician: Daniel Nones Treating Physician/Extender: Rudene Re in Treatment: 16 Active Inactive Nutrition Nursing Diagnoses: Impaired glucose control: actual or potential Potential for alteratiion in Nutrition/Potential for imbalanced nutrition Goals: Patient/caregiver verbalizes understanding of need to maintain therapeutic glucose control per primary care physician Date Initiated: 07/09/2014 Goal Status: Active Patient/caregiver will maintain therapeutic glucose control Date Initiated: 07/09/2014 Goal Status: Active Interventions: Assess patient nutrition upon admission and as needed per policy Provide education on elevated blood sugars and  impact on wound healing Provide education on nutrition Treatment Activities: Education provided on Nutrition : 07/09/2014 Notes: Orientation to the Wound Care Program Nursing Diagnoses: Knowledge deficit related to the wound healing center program Goals: Patient/caregiver will verbalize understanding of the Wound Healing Center Program Date Initiated: 07/09/2014 Goal Status: Active Interventions: Provide education on orientation to the wound center Nicholas Leonard. ([id number]) Notes: Venous Leg Ulcer Nursing Diagnoses: Knowledge deficit related to disease process and management Potential for venous Insuffiency (use before diagnosis confirmed) Goals: Patient will maintain optimal edema control Date Initiated: 07/09/2014 Goal Status: Active Verify adequate tissue perfusion prior to therapeutic compression application Date Initiated: 07/09/2014 Goal Status: Active Interventions: Assess peripheral edema status every visit. Compression as ordered Provide education on venous insufficiency Treatment Activities: Test ordered outside of clinic :  11/01/2014 Therapeutic compression applied : 11/01/2014 Notes: Wound/Skin Impairment Nursing Diagnoses: Knowledge deficit related to ulceration/compromised skin integrity Goals: Patient/caregiver will verbalize understanding of skin care regimen Date Initiated: 07/09/2014 Goal Status: Active Ulcer/skin breakdown will heal within 14 weeks Date Initiated: 07/09/2014 Goal Status: Active Interventions: Assess patient/caregiver ability to obtain necessary supplies Assess patient/caregiver ability to perform ulcer/skin care regimen upon admission and as needed Assess ulceration(s) every visit Provide education on ulcer and skin care Nicholas Leonard, Nicholas Leonard ([id number]) Treatment Activities: Patient referred to home care : 11/01/2014 Skin care regimen initiated : 11/01/2014 Topical wound management initiated : 11/01/2014 Notes: Electronic Signature(s) Signed: 11/01/2014 1:29:06 PM By: Elpidio Eric BSN, RN Signed: 11/01/2014 5:03:22 PM By: Elliot Gurney, RN, BSN, Kim RN, BSN Entered By: Elliot Gurney, RN, BSN, Kim on 11/01/2014 13:29:05 Nicholas Leonard ([id number]) -------------------------------------------------------------------------------- Pain Assessment Details Patient Name: Nicholas Leonard, Nicholas Leonard. Date of Service: 11/01/2014 1:00 PM Medical Record Number: [id number] Patient Account Number: [id number] Date of Birth/Sex: [birth year] (69 y.o. Male) Treating RN: Clover Mealy, RN, BSN, [place] Sink Primary Care Physician: Daniel Nones Other Clinician: Referring Physician: Daniel Nones Treating Physician/Extender: Rudene Re in Treatment: 16 Active Problems Location of Pain Severity and Description of Pain Patient Has Paino No Site Locations Pain Management and Medication Current Pain Management: Electronic Signature(s) Signed: 11/01/2014 1:10:05 PM By: Elpidio Eric BSN, RN Entered By: Elpidio Eric on 11/01/2014 13:10:05 Nicholas Leonard  ([id number]) -------------------------------------------------------------------------------- Patient/Caregiver Education Details Patient Name: Nicholas Leonard, Nicholas Leonard. Date of Service: 11/01/2014 1:00 PM Medical Record Number: [id number] Patient Account Number: [id number] Date of Birth/Gender: [birth year] (69 y.o. Male) Treating RN: Clover Mealy, RN, BSN, [place] Sink Primary Care Physician: Daniel Nones Other Clinician: Referring Physician: Daniel Nones Treating Physician/Extender: Rudene Re in Treatment: 16 Education Assessment Education Provided To: Patient Education Topics Provided Elevated Blood Sugar/ Impact on Healing: Methods: Explain/Verbal Responses: State content correctly Wound/Skin Impairment: Methods: Explain/Verbal Responses: State content correctly Electronic Signature(s) Signed: 11/01/2014 1:41:34 PM By: Elpidio Eric BSN, RN Entered By: Elpidio Eric on 11/01/2014 13:41:34 Nicholas Leonard ([id number]) -------------------------------------------------------------------------------- Wound Assessment Details Patient Name: Nicholas Leonard. Date of Service: 11/01/2014 1:00 PM Medical Record Number: [id number] Patient Account Number: [id number] Date of Birth/Sex: 07-22-1945 (69 y.o. 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 Event: Gradually Appeared Etiology: Extremity Date Acquired: 06/10/2014 Wound Open Weeks Of Treatment: 16 Status: Clustered Wound: No Comorbid Congestive Heart Failure, Coronary History: Artery Disease, Hypertension, Type II Diabetes, Gout, Neuropathy Photos Photo Uploaded By: Clover Mealy,  Rita on 11/01/2014 14:30:40 Wound  Measurements Length: (cm) 0.5 Width: (cm) 0.5 Depth: (cm) 0.1 Area: (cm) 0.196 Volume: (cm) 0.02 % Reduction in Area: 84.4% % Reduction in Volume: 84.1% Epithelialization: Large (67-100%) Tunneling: No Undermining: No Wound Description Classification: Partial Thickness Foul Odor Diabetic Severity (Wagner): Grade 1 Wound Margin: Distinct, outline attached Exudate Amount: Small Exudate Type: Serous Exudate Color: amber After Cleansing: No Wound Bed Granulation Amount: Large (67-100%) Exposed Structure Poblano, Dantre E. ([id number]) Granulation Quality: Red, Hyper-granulation Fascia Exposed: No Necrotic Amount: None Present (0%) Fat Layer Exposed: No Tendon Exposed: No Muscle Exposed: No Joint Exposed: No Bone Exposed: No Limited to Skin Breakdown Periwound Skin Texture Texture Color No Abnormalities Noted: No No Abnormalities Noted: No Callus: No Atrophie Blanche: No Crepitus: No Cyanosis: No Excoriation: No Ecchymosis: No Fluctuance: No Erythema: No Friable: No Hemosiderin Staining: No Induration: No Mottled: No Localized Edema: Yes Pallor: No Rash: No Rubor: No Scarring: No Temperature / Pain Moisture Temperature: No Abnormality No Abnormalities Noted: No Tenderness on Palpation: Yes Dry / Scaly: No Maceration: No Moist: Yes Wound Preparation Ulcer Cleansing: Other: soap and water, Topical Anesthetic Applied: None Treatment Notes Wound #1 (Right, Medial Lower Leg) 1. Cleansed with: Cleanse wound with antibacterial soap and water 3. Peri-wound Care: Skin Prep 4. Dressing Applied: Mepitel 5. Secondary Dressing Applied ABD Pad 7. Secured with 2 Layer Lite Compression System - Right Lower Extremity Electronic Signature(s) Signed: 11/01/2014 1:17:04 PM By: Elpidio Eric BSN, RN Entered By: Elpidio Eric on 11/01/2014 13:17:04 Nicholas Leonard ([id number]) Nicholas Leonard  ([id number]) -------------------------------------------------------------------------------- Vitals Details Patient Name: Nicholas Leonard. Date of Service: 11/01/2014 1:00 PM Medical Record Number: [id number] Patient Account Number: [id number] Date of Birth/Sex: 1945/04/29 (69 y.o. Male) Treating RN: Clover Mealy, RN, BSN, [place] Primary Care Physician: Daniel Nones Other Clinician: Referring Physician: Daniel Nones Treating Physician/Extender: Rudene Re in Treatment: 16 Vital Signs Time Taken: 13:10 Temperature (F): 97.4 Height (in): 72 Pulse (bpm): 85 Weight (lbs): 215 Respiratory Rate (breaths/min): 16 Body Mass Index (BMI): 29.2 Blood Pressure (mmHg): 99/60 Reference Range: 80 - 120 mg / dl Electronic Signature(s) Signed: 11/01/2014 1:10:28 PM By: Elpidio Eric BSN, RN Entered By: Elpidio Eric on 11/01/2014 13:10:28

## 2014-11-04 NOTE — Progress Notes (Signed)
HARLIS, CHAMPOUX ([id number]) Visit Report for 11/01/2014 Chief Complaint Document Details Patient Name: Nicholas Leonard, Nicholas Leonard. Date of Service: 11/01/2014 1:00 PM Medical Record Number: [id number] Patient Account Number: [id number] Date of Birth/Sex: 1945-09-10 (69 y.o. Male) Treating RN: Primary Care Physician: Daniel Nones Other Clinician: Referring Physician: Daniel Nones Treating Physician/Extender: Rudene Re in Treatment: 16 Information Obtained from: Patient Chief Complaint Patient presents to the wound care center for a consult due non healing wound ulcer on the right lower limb which was noted to be there for about 3 weeks now. he is also had swelling of both lower extremities for about 2 months now. Electronic Signature(s) Signed: 11/01/2014 1:41:44 PM By: Evlyn Kanner MD, FACS Entered By: Evlyn Kanner on 11/01/2014 13:41:44 Nicholas Leonard ([id number]) -------------------------------------------------------------------------------- HPI Details Patient Name: Nicholas Leonard, Nicholas Leonard. Date of Service: 11/01/2014 1:00 PM Medical Record Number: [id number] Patient Account Number: [id number] Date of Birth/Sex: July 18, 1945 (69 y.o. Male) Treating RN: Primary Care Physician: Daniel Nones Other Clinician: Referring Physician: Daniel Nones Treating Physician/Extender: Rudene Re in Treatment: 16 History of Present Illness HPI Description: 70 year old gentleman who is recently discharged from [facility] and was admitted between 3 18-3/22 by his primary care physician Dr. Daniel Nones. He has adult onset diabetes and was recently admitted for acute on chronic left-sided systolic congestive heart failure, acute on chronic renal failure, coronary artery disease, ischemic cardiomyopathy, ventricular fibrillation status post AICD implantation, history of atrial fibrillation on Coumadin therapy, hypothyroidism, history of venous stasis ulcer, pulmonary embolism with  deep vein thrombosis on chronic Coumadin therapy, osteoarthritis and gout. He does not smoke any longer and occasionally has alcohol. as not been compliant with getting his blood sugars checked and does not know the last time he had a hemoglobin A1c done. Does not recall when the last ultrasound of his lower extremities was done but he does say that he has had clots in his legs. 07/30/2014 the patient is back to [place] after a recent admission and discharge from the hospital and he was there between 07/19/2014 and 07/26/2014. Reviewed these notes and his final diagnosis was #1 elevated INR, #2 acute on chronic left-sided systolic congestive heart failure, #3 hypothyroidism, #4 gout, #5 chronic kidney disease stage III, #6 acute renal failure, #7 carotid artery disease, #8 history of pulmonary embolism and DVT, #9 adult onset diabetes mellitus. During the admission he was corrected with giving vitamin K and due to his difficulty with Coumadin he was changed over to Eliquis. The patient also was found to have diffuse edema and there was evidence of ascites on abdominal ultrasound and was placed on a Lasix drip by nephrologist. His just discharge medications were noted by me. they have been using Santyl on his right lower extremity wound and he has been doing this dressing as often as the nurse comes in. he has also developed a ulcer of the left thigh and this has been there for about 2 weeks and has some necrosis of skin over there. 08/23/2014 -- he recently saw the vascular surgeon Dr. Gilda Crease and has had both an arterial and duplex venous study done. Though we do not have the official report yet the patient tells me that his arteries were okay and there was no problem with the blood flow. 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 08/30/2014 -- his vascular surgery has been scheduled for Tuesday, May 24 and he  will come back and see [place] if he needs to soon after that for a rewrap. Addendum: We have finally received his transcript of his office visit with Dr. Gilda Crease on 08/22/2014 and note that the duplex US of the lower extremity arterial system bilaterally SFA are patent, with diffuse tibial disease with monophasic tibial signals on the right and biphasic signals on the left. Duplex Venous ultrasound of the lower extremity shows normal deep venous system, superficial reflux is present over a short segment of the GSV in the region of the ulcer. However the GSV from the knees to the groin is competent. The recommendations were for the patient to have angiography of the lower extremity with the hope of JHASE, CREPPEL. ([id number]) intervention for limb salvage. Once the arterial compromise has been addressed then the venous portion can be more effectively treated. 09/05/2014 - s/p RLE angiogram without intervention 09/03/2014 by Dr Gilda Crease. No new complaints today. No significant pain. No fever or chills. Minimal drainage. 09/13/2014 -- On 09/03/2014 o Procedure(s) Performed: 1. Abdominal aortogram 2. Right lower extremity distal runoff third order catheter placement 3. Additional right third order catheter placement as per the patient the vascular surgeon will see him back in 6 months and no other procedures planned for in the near future. 09/27/2014 -- He is here for his first application of a EpiFix which was approved by his insurance company. 10/10/2014 -- He is for his second application of a Epifix. He is doing fine and has no complaints 10/25/2014 he is here for his third application of Epifix and is otherwise doing fine. Electronic Signature(s) Signed: 11/01/2014 1:42:04 PM By: Evlyn Kanner MD, FACS Entered By: Evlyn Kanner on 11/01/2014 13:42:04 Nicholas Leonard ([id number]) -------------------------------------------------------------------------------- Physical Exam Details Patient  Name: Nicholas Leonard, Nicholas Leonard. Date of Service: 11/01/2014 1:00 PM Medical Record Number: [id number] Patient Account Number: [id number] Date of Birth/Sex: 06-Jan-1946 (69 y.o. Male) Treating RN: Clover Mealy, RN, BSN, [place] Sink Primary Care Physician: Daniel Nones Other Clinician: Referring Physician: Daniel Nones Treating Physician/Extender: Rudene Re in Treatment: 16 Constitutional . Pulse regular. Respirations normal and unlabored. Afebrile. . Eyes Nonicteric. Reactive to light. Ears, Nose, Mouth, and Throat Lips, teeth, and gums WNL.Marland Kitchen Moist mucosa without lesions . Neck supple and nontender. No palpable supraclavicular or cervical adenopathy. Normal sized without goiter. Respiratory WNL. No retractions.. Breath sounds WNL, No rubs, rales, rhonchi, or wheeze.. Cardiovascular Heart rhythm and rate regular, no murmur or gallop.. Pedal Pulses WNL. he still has some edema of his right lower extremity. Musculoskeletal Adexa without tenderness or enlargement.. Digits and nails w/o clubbing, cyanosis, infection, petechiae, ischemia, or inflammatory conditions.. Integumentary (Hair, Skin) No suspicious lesions. No crepitus or fluctuance. No peri-wound warmth or erythema. No masses.Marland Kitchen Psychiatric Judgement and insight Intact.. No evidence of depression, anxiety, or agitation.. Notes The wound looks excellent has majority of it covered with epithelium Electronic Signature(s) Signed: 11/01/2014 1:42:45 PM By: Evlyn Kanner MD, FACS Entered By: Evlyn Kanner on 11/01/2014 13:42:44 Nicholas Leonard ([id number]) -------------------------------------------------------------------------------- Physician Orders Details Patient Name: Nicholas Leonard Date of Service: 11/01/2014 1:00 PM Medical Record Number: [id number] Patient Account Number: [id number] Date of Birth/Sex: [birth year] (69 y.o. Male) Treating RN: Huel Coventry Primary Care Physician: Daniel Nones Other Clinician: Referring Physician: Daniel Nones Treating Physician/Extender: Rudene Re in Treatment: 78 Verbal / Phone Orders: Yes Clinician: Huel Coventry Read Back and Verified: Yes Diagnosis Coding Wound Cleansing Wound #1 Right,Medial Lower Leg o Clean wound with Normal Saline. Anesthetic Wound #1 Right,Medial Lower  Leg o Topical Lidocaine 4% cream applied to wound bed prior to debridement Primary Wound Dressing Wound #1 Right,Medial Lower Leg o Mepitel One Secondary Dressing Wound #1 Right,Medial Lower Leg o ABD pad Dressing Change Frequency Wound #1 Right,Medial Lower Leg o Change dressing every week Follow-up Appointments Wound #1 Right,Medial Lower Leg o Return Appointment in 1 week. Edema Control Wound #1 Right,Medial Lower Leg o 2 Layer Lite Compression System - Right Lower Extremity Electronic Signature(s) Signed: 11/01/2014 5:03:22 PM By: Elliot Gurney RN, BSN, Kim RN, BSN Signed: 11/04/2014 12:13:47 PM By: Evlyn Kanner MD, FACS Entered By: Elliot Gurney RN, BSN, Kim on 11/01/2014 13:33:06 Nicholas Leonard, Nicholas Leonard ([id number], MROCZKOWSKI ([id number]) -------------------------------------------------------------------------------- Problem List Details Patient Name: Nicholas Leonard, Nicholas Leonard. Date of Service: 11/01/2014 1:00 PM Medical Record Number: [id number] Patient Account Number: [id number] Date of Birth/Sex: [birth year] (69 y.o. 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 Date of Service: 11/01/2014 1:00 PM Medical Record Number: [id number] Patient Account Number: [id number] Date of Birth/Sex: [birth year] (69 y.o. Male) Treating RN: Clover Mealy, RN, BSN, Home Sink Primary Care Physician: Daniel Nones Other Clinician: Referring Physician: Daniel Nones Treating Physician/Extender: Rudene Re in Treatment: 16 Subjective Chief Complaint Information obtained from Patient Patient presents to the wound care center for a consult due non healing wound ulcer on the right lower limb which was noted to be there for about 3 weeks now. he is also had swelling of both lower extremities for about 2 months now. History of Present Illness (HPI) 69 year old gentleman who is recently discharged from [facility] and was admitted between 3 18-3/22 by his primary care physician Dr. Daniel Nones. He has adult onset diabetes and was recently admitted for acute on chronic left-sided systolic congestive heart failure, acute on chronic renal failure, coronary artery disease, ischemic cardiomyopathy, ventricular fibrillation status post AICD implantation, history of atrial fibrillation on Coumadin therapy, hypothyroidism, history of venous stasis ulcer, pulmonary embolism with deep vein thrombosis on chronic Coumadin therapy, osteoarthritis  and gout. He does not smoke any longer and occasionally has alcohol. as not been compliant with getting his blood sugars checked and does not know the last time he had a hemoglobin A1c done. Does not recall when the last ultrasound of his lower extremities was done but he does say that he has had clots in his legs. 07/30/2014 the patient is back to [place] after a recent admission and discharge from the hospital and he was there between 07/19/2014 and 07/26/2014. Reviewed these notes and his final diagnosis was #1 elevated INR, #2 acute on chronic left-sided systolic congestive heart failure, #3 hypothyroidism, #4 gout, #5 chronic kidney disease stage III, #  6 acute renal failure, #7 carotid artery disease, #8 history of pulmonary embolism and DVT, #9 adult onset diabetes mellitus. During the admission he was corrected with giving vitamin K and due to his difficulty with Coumadin he was changed over to Eliquis. The patient also was found to have diffuse edema and there was evidence of ascites on abdominal ultrasound and was placed on a Lasix drip by nephrologist. His just discharge medications were noted by me. they have been using Santyl on his right lower extremity wound and he has been doing this dressing as often as the nurse comes in. he has also developed a ulcer of the left thigh and this has been there for about 2 weeks and has some necrosis of skin over there. 08/23/2014 -- he recently saw the vascular surgeon Dr. Gilda Crease and has had both an arterial and duplex venous study done. Though we do not have the official report yet the patient tells me that his arteries were okay and there was no problem with the blood flow. 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 08/30/2014 -- his vascular surgery has been scheduled for Tuesday, May 24 and he will come back and see Nicholas Leonard, Nicholas Leonard ([id number]) [place] if he  needs to soon after that for a rewrap. Addendum: We have finally received his transcript of his office visit with Dr. Gilda Crease on 08/22/2014 and note that the duplex US of the lower extremity arterial system bilaterally SFA are patent, with diffuse tibial disease with monophasic tibial signals on the right and biphasic signals on the left. Duplex Venous ultrasound of the lower extremity shows normal deep venous system, superficial reflux is present over a short segment of the GSV in the region of the ulcer. However the GSV from the knees to the groin is competent. The recommendations were for the patient to have angiography of the lower extremity with the hope of intervention for limb salvage. Once the arterial compromise has been addressed then the venous portion can be more effectively treated. 09/05/2014 - s/p RLE angiogram without intervention 09/03/2014 by Dr Gilda Crease. No new complaints today. No significant pain. No fever or chills. Minimal drainage. 09/13/2014 -- On 09/03/2014 Procedure(s) Performed: 1. Abdominal aortogram 2. Right lower extremity distal runoff third order catheter placement 3. Additional right third order catheter placement as per the patient the vascular surgeon will see him back in 6 months and no other procedures planned for in the near future. 09/27/2014 -- He is here for his first application of a EpiFix which was approved by his insurance company. 10/10/2014 -- He is for his second application of a Epifix. He is doing fine and has no complaints 10/25/2014 he is here for his third application of Epifix and is otherwise doing fine. Objective Constitutional Pulse regular. Respirations normal and unlabored. Afebrile. Vitals Time Taken: 1:10 PM, Height: 72 in, Weight: 215 lbs, BMI: 29.2, Temperature: 97.4 F, Pulse: 85 bpm, Respiratory Rate: 16 breaths/min, Blood Pressure: 99/60 mmHg. Eyes Nonicteric. Reactive to light. Ears, Nose, Mouth, and Throat Lips, teeth, and  gums WNL.Marland Kitchen Moist mucosa without lesions . Neck supple and nontender. No palpable supraclavicular or cervical adenopathy. Normal sized without goiter. Respiratory WNL. No retractions.. Breath sounds WNL, No rubs, rales, rhonchi, or wheeze.Marland Kitchen Nicholas Leonard, Nicholas Leonard ([id number]) Cardiovascular Heart rhythm and rate regular, no murmur or gallop.. Pedal Pulses WNL. he still has some edema of his right lower extremity. Musculoskeletal  Adexa without tenderness or enlargement.. Digits and nails w/o clubbing, cyanosis, infection, petechiae, ischemia, or inflammatory conditions.Marland Kitchen Psychiatric Judgement and insight Intact.. No evidence of depression, anxiety, or agitation.. General Notes: The wound looks excellent has majority of it covered with epithelium Integumentary (Hair, Skin) No suspicious lesions. No crepitus or fluctuance. No peri-wound warmth or erythema. No masses.. Wound #1 status is Open. Original cause of wound was Gradually Appeared. The wound is located on the Right,Medial Lower Leg. The wound measures 0.5cm length x 0.5cm width x 0.1cm depth; 0.196cm^2 area and 0.02cm^3 volume. The wound is limited to skin breakdown. There is no tunneling or undermining noted. There is a small amount of serous drainage noted. The wound margin is distinct with the outline attached to the wound base. There is large (67-100%) red granulation within the wound bed. There is no necrotic tissue within the wound bed. The periwound skin appearance exhibited: Localized Edema, Moist. The periwound skin appearance did not exhibit: Callus, Crepitus, Excoriation, Fluctuance, Friable, Induration, Rash, Scarring, Dry/Scaly, Maceration, Atrophie Blanche, Cyanosis, Ecchymosis, Hemosiderin Staining, Mottled, Pallor, Rubor, Erythema. Periwound temperature was noted as No Abnormality. The periwound has tenderness on palpation. 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 Nicholas Leonard, Nicholas Leonard ([id number]) See him back next week. Plan Wound Cleansing: Wound #1 Right,Medial Lower Leg: Clean wound with Normal Saline. Anesthetic: Wound #1 Right,Medial Lower Leg: Topical Lidocaine 4% cream applied to wound bed prior to debridement Primary Wound Dressing: Wound #1 Right,Medial Lower Leg: Mepitel One Secondary Dressing: Wound #1 Right,Medial Lower Leg: ABD pad Dressing Change Frequency: Wound #1 Right,Medial Lower Leg: Change dressing every week Follow-up Appointments: Wound #1 Right,Medial Lower Leg: Return Appointment in 1 week. Edema Control: Wound #1 Right,Medial Lower Leg: 2 Layer Lite Compression System - Right Lower Extremity The wound looks very good and we will apply a piece of Mepitel and a 2 layer compression wrap. See him back next week. Electronic Signature(s) Signed: 11/01/2014 1:43:37 PM By: Evlyn Kanner MD, FACS Entered By: Evlyn Kanner on 11/01/2014 13:43:37 Nicholas Leonard ([id number]) -------------------------------------------------------------------------------- SuperBill Details Patient Name: Nicholas Leonard, Nicholas Leonard. Date of Service: 11/01/2014 Medical Record Number: [id number] Patient Account Number: [id number] Date of Birth/Sex: 1945/06/08 (69 y.o. 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 L97.122 Non-pressure chronic ulcer of left thigh with fat layer exposed I70.232 Atherosclerosis of native arteries of right leg with ulceration of calf Facility Procedures CPT4: Description Modifier Quantity Code [id number] (Facility Use Only) 52841LK - APPLY MULTLAY COMPRS LWR RT 1 LEG Physician Procedures CPT4 Code Description: 4401027 [id number] -  WC PHYS LEVEL 3 - EST PT ICD-10 Description Diagnosis E11.622 Type 2 diabetes mellitus with other skin ulcer I87.311 Chronic venous hypertension (idiopathic) with ulcer of Modifier: right lower e Quantity: 1 xtremity Electronic Signature(s) Signed: 11/04/2014 12:13:47 PM By: Evlyn Kanner MD, FACS Previous Signature: 11/01/2014 1:43:52 PM Version By: Evlyn Kanner MD, FACS Entered By: Elliot Gurney RN, BSN, Kim on 11/04/2014 10:42:27

## 2014-11-12 ENCOUNTER — Encounter: Payer: Medicare Other | Attending: Surgery | Admitting: Surgery

## 2014-11-12 DIAGNOSIS — I70232 Atherosclerosis of native arteries of right leg with ulceration of calf: Secondary | ICD-10-CM | POA: Diagnosis not present

## 2014-11-12 DIAGNOSIS — I87311 Chronic venous hypertension (idiopathic) with ulcer of right lower extremity: Secondary | ICD-10-CM | POA: Diagnosis not present

## 2014-11-12 DIAGNOSIS — I5020 Unspecified systolic (congestive) heart failure: Secondary | ICD-10-CM | POA: Diagnosis not present

## 2014-11-12 DIAGNOSIS — I87011 Postthrombotic syndrome with ulcer of right lower extremity: Secondary | ICD-10-CM | POA: Diagnosis not present

## 2014-11-12 DIAGNOSIS — I2510 Atherosclerotic heart disease of native coronary artery without angina pectoris: Secondary | ICD-10-CM | POA: Insufficient documentation

## 2014-11-12 DIAGNOSIS — N183 Chronic kidney disease, stage 3 (moderate): Secondary | ICD-10-CM | POA: Diagnosis not present

## 2014-11-12 DIAGNOSIS — E039 Hypothyroidism, unspecified: Secondary | ICD-10-CM | POA: Insufficient documentation

## 2014-11-12 DIAGNOSIS — E11622 Type 2 diabetes mellitus with other skin ulcer: Secondary | ICD-10-CM | POA: Diagnosis not present

## 2014-11-12 DIAGNOSIS — Z95810 Presence of automatic (implantable) cardiac defibrillator: Secondary | ICD-10-CM | POA: Diagnosis not present

## 2014-11-12 DIAGNOSIS — E1122 Type 2 diabetes mellitus with diabetic chronic kidney disease: Secondary | ICD-10-CM | POA: Diagnosis not present

## 2014-11-12 DIAGNOSIS — M1990 Unspecified osteoarthritis, unspecified site: Secondary | ICD-10-CM | POA: Insufficient documentation

## 2014-11-12 DIAGNOSIS — L97911 Non-pressure chronic ulcer of unspecified part of right lower leg limited to breakdown of skin: Secondary | ICD-10-CM | POA: Insufficient documentation

## 2014-11-12 DIAGNOSIS — I5021 Acute systolic (congestive) heart failure: Secondary | ICD-10-CM | POA: Diagnosis not present

## 2014-11-12 DIAGNOSIS — Z86711 Personal history of pulmonary embolism: Secondary | ICD-10-CM | POA: Diagnosis not present

## 2014-11-12 DIAGNOSIS — I255 Ischemic cardiomyopathy: Secondary | ICD-10-CM | POA: Insufficient documentation

## 2014-11-12 DIAGNOSIS — I80291 Phlebitis and thrombophlebitis of other deep vessels of right lower extremity: Secondary | ICD-10-CM | POA: Insufficient documentation

## 2014-11-12 DIAGNOSIS — Z7901 Long term (current) use of anticoagulants: Secondary | ICD-10-CM | POA: Insufficient documentation

## 2014-11-12 DIAGNOSIS — I4891 Unspecified atrial fibrillation: Secondary | ICD-10-CM | POA: Diagnosis not present

## 2014-11-12 NOTE — Progress Notes (Signed)
DIJUAN, SLEETH ([id number]) Visit Report for 11/12/2014 Chief Complaint Document Details Patient Name: Nicholas Leonard, Nicholas Leonard. Date of Service: 11/12/2014 1:00 PM Medical Record Number: [id number] Patient Account Number: [id number] Date of Birth/Sex: April 06, 1946 (69 y.o. Male) Treating RN: Clover Mealy, RN, BSN, [place] Sink Primary Care Physician: Daniel Nones Other Clinician: Referring Physician: Daniel Nones Treating Physician/Extender: Rudene Re in Treatment: 18 Information Obtained from: Patient Chief Complaint Patient presents to the wound care center for a consult due non healing wound ulcer on the right lower limb which was noted to be there for about 3 weeks now. he is also had swelling of both lower extremities for about 2 months now. Electronic Signature(s) Signed: 11/12/2014 1:35:46 PM By: Evlyn Kanner MD, FACS Entered By: Evlyn Kanner on 11/12/2014 13:35:46 Nicholas Leonard ([id number]) -------------------------------------------------------------------------------- HPI Details Patient Name: Nicholas Leonard, Nicholas Leonard. Date of Service: 11/12/2014 1:00 PM Medical Record Number: [id number] Patient Account Number: [id number] Date of Birth/Sex: October 12, 1945 (69 y.o. Male) Treating RN: Clover Mealy, RN, BSN, [place] Sink Primary Care Physician: Daniel Nones Other Clinician: Referring Physician: Daniel Nones Treating Physician/Extender: Rudene Re in Treatment: 18 History of Present Illness HPI Description: 69 year old gentleman who is recently discharged from [facility] and was admitted between 3 18-3/22 by his primary care physician Dr. Daniel Nones. He has adult onset diabetes and was recently admitted for acute on chronic left-sided systolic congestive heart failure, acute on chronic renal failure, coronary artery disease, ischemic cardiomyopathy, ventricular fibrillation status post AICD implantation, history of atrial fibrillation on Coumadin therapy, hypothyroidism, history of venous  stasis ulcer, pulmonary embolism with deep vein thrombosis on chronic Coumadin therapy, osteoarthritis and gout. He does not smoke any longer and occasionally has alcohol. as not been compliant with getting his blood sugars checked and does not know the last time he had a hemoglobin A1c done. Does not recall when the last ultrasound of his lower extremities was done but he does say that he has had clots in his legs. 07/30/2014 the patient is back to [place] after a recent admission and discharge from the hospital and he was there between 07/19/2014 and 07/26/2014. Reviewed these notes and his final diagnosis was #1 elevated INR, #2 acute on chronic left-sided systolic congestive heart failure, #3 hypothyroidism, #4 gout, #5 chronic kidney disease stage III, #6 acute renal failure, #7 carotid artery disease, #8 history of pulmonary embolism and DVT, #9 adult onset diabetes mellitus. During the admission he was corrected with giving vitamin K and due to his difficulty with Coumadin he was changed over to Eliquis. The patient also was found to have diffuse edema and there was evidence of ascites on abdominal ultrasound and was placed on a Lasix drip by nephrologist. His just discharge medications were noted by me. they have been using Santyl on his right lower extremity wound and he has been doing this dressing as often as the nurse comes in. he has also developed a ulcer of the left thigh and this has been there for about 2 weeks and has some necrosis of skin over there. 08/23/2014 -- he recently saw the vascular surgeon Dr. Gilda Crease and has had both an arterial and duplex venous study done. Though we do not have the official report yet the patient tells me that his arteries were okay and there was no problem with the blood flow. 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 08/30/2014 -- his vascular surgery has been  scheduled for Tuesday, May 24 and he will come back and see [place] if he needs to soon after that for a rewrap. Addendum: We have finally received his transcript of his office visit with Dr. Gilda Crease on 08/22/2014 and note that the duplex US of the lower extremity arterial system bilaterally SFA are patent, with diffuse tibial disease with monophasic tibial signals on the right and biphasic signals on the left. Duplex Venous ultrasound of the lower extremity shows normal deep venous system, superficial reflux is present over a short segment of the GSV in the region of the ulcer. However the GSV from the knees to the groin is competent. The recommendations were for the patient to have angiography of the lower extremity with the hope of SARA, SELVIDGE. ([id number]) intervention for limb salvage. Once the arterial compromise has been addressed then the venous portion can be more effectively treated. 09/05/2014 - s/p RLE angiogram without intervention 09/03/2014 by Dr Gilda Crease. No new complaints today. No significant pain. No fever or chills. Minimal drainage. 09/13/2014 -- On 09/03/2014 o Procedure(s) Performed: 1. Abdominal aortogram 2. Right lower extremity distal runoff third order catheter placement 3. Additional right third order catheter placement as per the patient the vascular surgeon will see him back in 6 months and no other procedures planned for in the near future. 09/27/2014 -- He is here for his first application of a EpiFix which was approved by his insurance company. 10/10/2014 -- He is for his second application of a Epifix. He is doing fine and has no complaints 10/25/2014 he is here for his third application of Epifix and is otherwise doing fine. 11/12/2014 -- he has again developed a lot of edema and his PCP has put him on 2 diuretics. Electronic Signature(s) Signed: 11/12/2014 1:36:23 PM By: Evlyn Kanner MD, FACS Entered By: Evlyn Kanner on 11/12/2014 13:36:22 Nicholas Leonard  ([id number]) -------------------------------------------------------------------------------- Physical Exam Details Patient Name: Nicholas Leonard, Nicholas Leonard. Date of Service: 11/12/2014 1:00 PM Medical Record Number: [id number] Patient Account Number: [id number] Date of Birth/Sex: 31-Mar-1946 (69 y.o. Male) Treating RN: Clover Mealy, RN, BSN, [place] Sink Primary Care Physician: Daniel Nones Other Clinician: Referring Physician: Daniel Nones Treating Physician/Extender: Rudene Re in Treatment: 18 Constitutional . Pulse regular. Respirations normal and unlabored. Afebrile. . Eyes Nonicteric. Reactive to light. Ears, Nose, Mouth, and Throat Lips, teeth, and gums WNL.Marland Kitchen Moist mucosa without lesions . Neck supple and nontender. No palpable supraclavicular or cervical adenopathy. Normal sized without goiter. Respiratory WNL. No retractions.. Cardiovascular Pedal Pulses WNL. No clubbing, cyanosis or edema. Chest Breasts symmetical and no nipple discharge.. Breast tissue WNL, no masses, lumps, or tenderness.. Lymphatic No adneopathy. No adenopathy. No adenopathy. Musculoskeletal Adexa without tenderness or enlargement.. Digits and nails w/o clubbing, cyanosis, infection, petechiae, ischemia, or inflammatory conditions.. Integumentary (Hair, Skin) No suspicious lesions. No crepitus or fluctuance. No peri-wound warmth or erythema. No masses.Marland Kitchen Psychiatric Judgement and insight Intact.. No evidence of depression, anxiety, or agitation.. Notes the wound looks very good and most of it is covered with epithelium except for maybe a 3 mm diameter area towards the center Electronic Signature(s) Signed: 11/12/2014 1:36:50 PM By: Evlyn Kanner MD, FACS Entered By: Evlyn Kanner on 11/12/2014 13:36:50 Nicholas Leonard ([id number]) -------------------------------------------------------------------------------- Physician Orders Details Patient Name: Nicholas Leonard, Nicholas Leonard. Date of Service: 11/12/2014 1:00 PM Medical  Record Number: [id number] Patient Account Number: [id number] Date of Birth/Sex: 08-Aug-1945 (69 y.o. Male) Treating RN: Clover Mealy, RN, BSN, [place] Sink Primary Care Physician: Daniel Nones Other Clinician: Referring Physician: Daniel Nones Treating  Physician/Extender: Rudene Re in Treatment: 22 Verbal / Phone Orders: Yes Clinician: Afful, RN, BSN, Rita Read Back and Verified: Yes Diagnosis Coding Wound Cleansing Wound #1 Right,Medial Lower Leg o May shower with protection. o No tub bath. Skin Barriers/Peri-Wound Care Wound #1 Right,Medial Lower Leg o Skin Prep Primary Wound Dressing Wound #1 Right,Medial Lower Leg o Promogran o Mepitel One Secondary Dressing Wound #1 Right,Medial Lower Leg o ABD pad Dressing Change Frequency Wound #1 Right,Medial Lower Leg o Change dressing every week Follow-up Appointments Wound #1 Right,Medial Lower Leg o Return Appointment in 1 week. Edema Control Wound #1 Right,Medial Lower Leg o 2 Layer Lite Compression System - Right Lower Extremity Electronic Signature(s) Signed: 11/12/2014 1:19:56 PM By: Elpidio Eric BSN, RN Signed: 11/12/2014 3:57:42 PM By: Evlyn Kanner MD, FACS Nicholas Leonard, Nicholas Leonard ([id number]) Entered By: Elpidio Eric on 11/12/2014 13:19:56 Nicholas Leonard ([id number]) -------------------------------------------------------------------------------- Problem List Details Patient Name: Nicholas Leonard, Nicholas Leonard. Date of Service: 11/12/2014 1:00 PM Medical Record Number: [id number] Patient Account Number: [id number] Date of Birth/Sex: [birth year] (69 y.o. Male) Treating RN: Clover Mealy, RN, BSN, [place] Sink Primary Care Physician: Daniel Nones Other Clinician: Referring Physician: Daniel Nones Treating Physician/Extender: Rudene Re in Treatment: 57 Active Problems ICD-10 Encounter Code Description Active Date Diagnosis E11.622 Type 2 diabetes mellitus with other skin ulcer 07/09/2014 Yes I50.21 Acute systolic (congestive) heart  failure 07/09/2014 Yes I80.291 Phlebitis and thrombophlebitis of other deep vessels of 07/09/2014 Yes right lower extremity I87.311 Chronic venous hypertension (idiopathic) with ulcer of 07/09/2014 Yes right lower extremity 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 Date of Service: 11/12/2014 1:00 PM Medical Record Number: [id number] Patient Account Number: [id number] Date of Birth/Sex: [birth year] (69 y.o. Male) Treating RN: Clover Mealy, RN, BSN, [place] Sink Primary Care Physician: Daniel Nones Other Clinician: Referring Physician: Daniel Nones Treating Physician/Extender: Rudene Re in Treatment: 18 Subjective Chief Complaint Information obtained from Patient Patient presents to the wound care center for a consult due non healing wound ulcer on the right lower limb which was noted to be there for about 3 weeks now. he is also had swelling of both lower extremities for about 2 months now. History of Present Illness (HPI) 69 year old gentleman who is recently discharged from [facility] and was admitted between 3 18-3/22 by his primary care physician Dr. Daniel Nones. He has adult onset diabetes and was recently admitted for acute on chronic left-sided systolic congestive heart failure, acute on chronic renal failure, coronary artery disease, ischemic cardiomyopathy, ventricular fibrillation status post  AICD implantation, history of atrial fibrillation on Coumadin therapy, hypothyroidism, history of venous stasis ulcer, pulmonary embolism with deep vein thrombosis on chronic Coumadin therapy, osteoarthritis and gout. He does not smoke any longer and occasionally has alcohol. as not been compliant with getting his blood sugars checked and does not know the last time he had a hemoglobin A1c done. Does not recall when the last ultrasound of his lower extremities was done but he does say that he has had clots in his legs. 07/30/2014 the patient is back to [place] after a recent admission and discharge from  the hospital and he was there between 07/19/2014 and 07/26/2014. Reviewed these notes and his final diagnosis was #1 elevated INR, #2 acute on chronic left-sided systolic congestive heart failure, #3 hypothyroidism, #4 gout, #5 chronic kidney disease stage III, #6 acute renal failure, #7 carotid artery disease, #8 history of pulmonary embolism and DVT, #9 adult onset diabetes mellitus. During the admission he was corrected with giving vitamin K and due to his difficulty with Coumadin he was changed over to Eliquis. The patient also was found to have diffuse edema and there was evidence of ascites on abdominal ultrasound and was placed on a Lasix drip by nephrologist. His just discharge medications were noted by me. they have been using Santyl on his right lower extremity wound and he has been doing this dressing as often as the nurse comes in. he has also developed a ulcer of the left thigh and this has been there for about 2 weeks and has some necrosis of skin over there. 08/23/2014 -- he recently saw the vascular surgeon Dr. Gilda Crease and has had both an arterial and duplex venous study done. Though we do not have the official report yet the patient tells me that his arteries were okay and there was no problem with the blood flow. However he does have varicose veins and incompetence of the venous  system and he is going to be scheduled for endovenous ablation. 08/30/2014 -- his vascular surgery has been scheduled for Tuesday, May 24 and he will come back and see Nicholas Leonard, Nicholas Leonard ([id number]) [place] if he needs to soon after that for a rewrap. Addendum: We have finally received his transcript of his office visit with Dr. Gilda Crease on 08/22/2014 and note that the duplex US of the lower extremity arterial system bilaterally SFA are patent, with diffuse tibial disease with monophasic tibial signals on the right and biphasic signals on the left. Duplex Venous ultrasound of the lower extremity shows normal deep venous system, superficial reflux is present over a short segment of the GSV in the region of the ulcer. However the GSV from the knees to the groin is competent. The recommendations were for the patient to have angiography of the lower extremity with the hope of intervention for limb salvage. Once the arterial compromise has been addressed then the venous portion can be more effectively treated. 09/05/2014 - s/p RLE angiogram without intervention 09/03/2014 by Dr Gilda Crease. No new complaints today. No significant pain. No fever or chills. Minimal drainage. 09/13/2014 -- On 09/03/2014 Procedure(s) Performed: 1. Abdominal aortogram 2. Right lower extremity distal runoff third order catheter placement 3. Additional right third order catheter placement as per the patient the vascular surgeon will see him back in 6 months and no other procedures planned for in the near future. 09/27/2014 -- He is here for his first application of a EpiFix which was approved by his insurance company. 10/10/2014 -- He is for his second application of a Epifix. He is doing fine and has no complaints 10/25/2014 he is here for his third application of Epifix and is otherwise doing fine. 11/12/2014 -- he has again developed a lot of edema and his PCP has put him on 2 diuretics. Objective Constitutional Pulse regular.  Respirations normal and unlabored. Afebrile. Vitals Time Taken: 1:07 PM, Height: 72 in, Weight: 215 lbs, BMI: 29.2, Temperature: 97.9 F, Pulse: 83 bpm, Respiratory Rate: 17 breaths/min, Blood Pressure: 107/69 mmHg. Eyes Nonicteric. Reactive to light. Ears, Nose, Mouth, and Throat Lips, teeth, and gums WNL.Marland Kitchen Moist  mucosa without lesions . Neck supple and nontender. No palpable supraclavicular or cervical adenopathy. Normal sized without goiter. Respiratory WNL. No retractions.Marland Kitchen Nicholas Leonard, Nicholas Leonard ([id number]) Cardiovascular Pedal Pulses WNL. No clubbing, cyanosis or edema. Chest Breasts symmetical and no nipple discharge.. Breast tissue WNL, no masses, lumps, or tenderness.. Lymphatic No adneopathy. No adenopathy. No adenopathy. Musculoskeletal Adexa without tenderness or enlargement.. Digits and nails w/o clubbing, cyanosis, infection, petechiae, ischemia, or inflammatory conditions.Marland Kitchen Psychiatric Judgement and insight Intact.. No evidence of depression, anxiety, or agitation.. General Notes: the wound looks very good and most of it is covered with epithelium except for maybe a 3 mm diameter area towards the center Integumentary (Hair, Skin) No suspicious lesions. No crepitus or fluctuance. No peri-wound warmth or erythema. No masses.. Wound #1 status is Open. Original cause of wound was Gradually Appeared. The wound is located on the Right,Medial Lower Leg. The wound measures 0.4cm length x 0.4cm width x 0.1cm depth; 0.126cm^2 area and 0.013cm^3 volume. The wound is limited to skin breakdown. There is no tunneling or undermining noted. There is a small amount of serous drainage noted. The wound margin is distinct with the outline attached to the wound base. There is large (67-100%) red granulation within the wound bed. There is no necrotic tissue within the wound bed. The periwound skin appearance exhibited: Localized Edema. The periwound skin appearance did not exhibit: Callus,  Crepitus, Excoriation, Fluctuance, Friable, Induration, Rash, Scarring, Dry/Scaly, Maceration, Moist, Atrophie Blanche, Cyanosis, Ecchymosis, Hemosiderin Staining, Mottled, Pallor, Rubor, Erythema. Periwound temperature was noted as No Abnormality. The periwound has tenderness on palpation. 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 ([id number]) I'm concerned about his edema and I have asked him to continue with elevation and his diuretics. We will also use collagen over the wound and a Mepitel dressing and apply a 2 layer Profore wrap. He will come back and see me next week. Plan Wound Cleansing: Wound #1 Right,Medial Lower Leg: May shower with protection. No tub bath. Skin Barriers/Peri-Wound Care: Wound #1 Right,Medial Lower Leg: Skin Prep Primary Wound Dressing: Wound #1 Right,Medial Lower Leg: Promogran Mepitel One Secondary Dressing: Wound #1 Right,Medial Lower Leg: ABD pad Dressing Change Frequency: Wound #1 Right,Medial Lower Leg: Change dressing every week Follow-up Appointments: Wound #1 Right,Medial Lower Leg: Return Appointment in 1 week. Edema Control: Wound #1 Right,Medial Lower Leg: 2 Layer Lite Compression System - Right Lower Extremity I'm concerned about his edema and I have asked him to continue with elevation and his diuretics. We will also use collagen over the wound and a Mepitel dressing and apply a 2 layer Profore wrap. He will come back and  see me next week. Nicholas Leonard, Nicholas Leonard ([id number]) Electronic Signature(s) Signed: 11/12/2014 1:38:49 PM By: Evlyn Kanner MD, FACS Previous Signature: 11/12/2014 1:37:55 PM Version By: Evlyn Kanner MD, FACS Previous Signature: 11/12/2014 1:37:32 PM Version By: Evlyn Kanner MD, FACS Entered By: Evlyn Kanner on 11/12/2014 13:38:49 Nicholas Leonard ([id number]) -------------------------------------------------------------------------------- SuperBill Details Patient Name: Nicholas Leonard, Nicholas Leonard. Date of Service: 11/12/2014 Medical Record Number: [id number] Patient Account Number: [id number]  Date of Birth/Sex: Mar 20, 1946 (69 y.o. 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 L97.122 Non-pressure chronic ulcer of left thigh with fat layer exposed I70.232 Atherosclerosis of native arteries of right leg with ulceration of calf Physician Procedures CPT4: Description Modifier Quantity Code [id number] - WC PHYS LEVEL 3 - EST PT 1 ICD-10 Description Diagnosis E11.622 Type 2 diabetes mellitus with other skin ulcer 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

## 2014-11-12 NOTE — Progress Notes (Signed)
Nicholas Leonard, Nicholas Leonard ([id number]) Visit Report for 11/12/2014 Arrival Information Details Patient Name: Nicholas Leonard, Nicholas Leonard. Date of Service: 11/12/2014 1:00 PM Medical Record Number: [id number] Patient Account Number: [id number] Date of Birth/Sex: [birth year] (69 y.o. Male) Treating RN: Clover Mealy, RN, BSN, [place] Sink Primary Care Physician: Daniel Nones Other Clinician: Referring Physician: Daniel Nones Treating Physician/Extender: Rudene Re in Treatment: 18 Visit Information History Since Last Visit Any new allergies or adverse reactions: No Patient Arrived: Ambulatory Had a fall or experienced change in No Arrival Time: 13:05 activities of daily living that may affect Accompanied By: self risk of falls: Transfer Assistance: None Signs or symptoms of abuse/neglect since last No Patient Identification Verified: Yes visito Secondary Verification Process Yes Hospitalized since last visit: No Completed: Has Dressing in Place as Prescribed: Yes Patient Requires Transmission- No Has Compression in Place as Prescribed: Yes Based Precautions: Pain Present Now: No Patient Has Alerts: Yes Patient Alerts: Patient on Blood Thinner Type II Diabetic ABI (L) 1.75 (R) 1.55 Electronic Signature(s) Signed: 11/12/2014 1:07:31 PM By: Elpidio Eric BSN, RN Entered By: Elpidio Eric on 11/12/2014 13:07:31 Nicholas Leonard ([id number]) -------------------------------------------------------------------------------- Encounter Discharge Information Details Patient Name: Nicholas Leonard. Date of Service: 11/12/2014 1:00 PM Medical Record Number: [id number] Patient Account Number: [id number] Date of Birth/Sex: 1945/11/09 (69 y.o. Male) Treating RN: Clover Mealy, RN, BSN, [place] Sink Primary Care Physician: Daniel Nones Other Clinician: Referring Physician: Daniel Nones Treating Physician/Extender: Rudene Re in Treatment: 82 Encounter Discharge Information Items Discharge Pain Level: 0 Discharge Condition:  Stable Ambulatory Status: Ambulatory Discharge Destination: Home Transportation: Private Auto Schedule Follow-up Appointment: No Medication Reconciliation completed No and provided to Patient/Care Ardenia Stiner: Patient Clinical Summary of Care: Declined Electronic Signature(s) Signed: 11/12/2014 1:31:06 PM By: Gwenlyn Perking Previous Signature: 11/12/2014 1:27:05 PM Version By: Elpidio Eric BSN, RN Entered By: Gwenlyn Perking on 11/12/2014 13:31:06 Nicholas Leonard ([id number]) -------------------------------------------------------------------------------- Lower Extremity Assessment Details Patient Name: Nicholas Leonard. Date of Service: 11/12/2014 1:00 PM Medical Record Number: [id number] Patient Account Number: [id number] Date of Birth/Sex: 1945-07-28 (69 y.o. Male) Treating RN: Clover Mealy, RN, BSN, [place] Sink Primary Care Physician: Daniel Nones Other Clinician: Referring Physician: Daniel Nones Treating Physician/Extender: Rudene Re in Treatment: 18 Edema Assessment Assessed: [Left: No] [Right: No] E[Left: dema] [Right: :] Calf Left: Right: Point of Measurement: 39 cm From Medial Instep cm 37 cm Ankle Left: Right: Point of Measurement: 12 cm From Medial Instep cm 24 cm Vascular Assessment Claudication: Claudication Assessment [Right:None] Pulses: Posterior Tibial Dorsalis Pedis Palpable: [Right:Yes] Extremity colors, hair growth, and conditions: Extremity Color: [Right:Mottled] Hair Growth on Extremity: [Right:Yes] Temperature of Extremity: [Right:Warm] Capillary Refill: [Right:< 3 seconds] Toe Nail Assessment Left: Right: Thick: Yes Discolored: Yes Deformed: No Improper Length and Hygiene: Yes Electronic Signature(s) Signed: 11/12/2014 1:12:20 PM By: Elpidio Eric BSN, RN Entered By: Elpidio Eric on 11/12/2014 13:12:20 Nicholas Leonard ([id number]) Nicholas Leonard, Nicholas Leonard Kitchen ([id number]) -------------------------------------------------------------------------------- Multi  Wound Chart Details Patient Name: Nicholas Leonard. Date of Service: 11/12/2014 1:00 PM Medical Record Number: [id number] Patient Account Number: [id number] Date of Birth/Sex: [birth year] (69 y.o. 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] Comorbid History: [1:Congestive Heart Failure, N/A Coronary Artery Disease, Hypertension, Type II Diabetes, Gout, Neuropathy] Date Acquired: [1:06/10/2014] [N/A:N/A] Weeks of Treatment: [1:18] [N/A:N/A] Wound Status: [1:Open] [N/A:N/A] Measurements L x W x D 0.4x0.4x0.1 [N/A:N/A] (cm) Area (cm) : [1:0.126] [N/A:N/A] Volume (cm) : [1:0.013] [N/A:N/A] % Reduction in Area: [1:90.00%] [N/A:N/A] % Reduction in Volume: 89.70% [N/A:N/A] Classification: [1:Partial Thickness] [N/A:N/A] HBO Classification: [1:Grade 1] [N/A:N/A] Exudate Amount: [1:Small] [N/A:N/A] Exudate Type: [1:Serous] [N/A:N/A] Exudate Color: [1:amber] [N/A:N/A] Wound Margin: [1:Distinct, outline attached N/A] Granulation Amount: [1:Large (67-100%)] [N/A:N/A] Granulation Quality: [1:Red, Hyper-granulation] [N/A:N/A] Necrotic Amount: [1:None Present (0%)] [N/A:N/A] Exposed Structures: [N/A:N/A] Fascia: No Fat: No Tendon: No Muscle: No Joint: No Bone: No Limited to Skin Breakdown Epithelialization: Large (67-100%) N/A N/A Periwound Skin Texture: Edema: Yes N/A N/A Excoriation: No Induration:  No Callus: No Crepitus: No Fluctuance: No Friable: No Rash: No Scarring: No Periwound Skin Maceration: No N/A N/A Moisture: Moist: No Dry/Scaly: No Periwound Skin Color: Atrophie Blanche: No N/A N/A Cyanosis: No Ecchymosis: No Erythema: No Hemosiderin Staining: No Mottled: No Pallor: No Rubor: No Temperature: No Abnormality N/A N/A Tenderness on Yes N/A N/A Palpation: Wound Preparation: Ulcer Cleansing: Other: N/A N/A soap and water Topical Anesthetic Applied: None Treatment Notes Electronic Signature(s) Signed: 11/12/2014 1:14:03 PM By: Elpidio Eric BSN, RN Entered By: Elpidio Eric on 11/12/2014 13:14:03 Nicholas Leonard ([id number]) -------------------------------------------------------------------------------- Multi-Disciplinary Care Plan Details Patient Name: Nicholas Leonard, Nicholas Leonard. Date of Service: 11/12/2014 1:00 PM Medical Record Number: [id number] Patient Account Number: [id number] Date of Birth/Sex: 06/22/1945 (69 y.o. Male) Treating RN: Clover Mealy, RN, BSN, [place] Sink Primary Care Physician: Daniel Nones Other Clinician: Referring Physician: Daniel Nones Treating Physician/Extender: Rudene Re in Treatment: 29 Active Inactive Nutrition Nursing Diagnoses: Impaired glucose control: actual or potential Potential for alteratiion in Nutrition/Potential for imbalanced nutrition Goals: Patient/caregiver verbalizes understanding of need to maintain therapeutic glucose control per primary care physician Date Initiated: 07/09/2014 Goal Status: Active Patient/caregiver will maintain therapeutic glucose control Date Initiated: 07/09/2014 Goal Status: Active Interventions: Assess patient nutrition upon admission and as needed per policy Provide education on elevated blood sugars and impact on wound healing Provide education on nutrition Treatment Activities: Education provided on Nutrition : 07/09/2014 Notes: Orientation to the Wound Care Program Nursing  Diagnoses: Knowledge deficit related to the wound healing center program Goals: Patient/caregiver will verbalize understanding of the Wound Healing Center Program Date Initiated: 07/09/2014 Goal Status: Active Interventions: Provide education on orientation to the wound center Nicholas Leonard, Nicholas Leonard. ([id number]) Notes: Venous Leg Ulcer Nursing Diagnoses: Knowledge deficit related to disease process and management Potential for venous Insuffiency (use before diagnosis confirmed) Goals: Patient will maintain optimal edema control Date Initiated: 07/09/2014 Goal Status: Active Verify adequate tissue perfusion prior to therapeutic compression application Date Initiated: 07/09/2014 Goal Status: Active Interventions: Assess peripheral edema status every visit. Compression as ordered Provide education on venous insufficiency Treatment Activities: Test ordered outside of clinic : 11/12/2014 Therapeutic compression applied : 11/12/2014 Notes: Wound/Skin Impairment Nursing Diagnoses: Knowledge deficit related to ulceration/compromised skin integrity Goals: Patient/caregiver will verbalize understanding of skin care regimen Date Initiated: 07/09/2014 Goal Status: Active Ulcer/skin breakdown will heal within 14 weeks Date Initiated: 07/09/2014 Goal Status: Active Interventions: Assess patient/caregiver ability to obtain necessary supplies Assess patient/caregiver ability to perform ulcer/skin care regimen upon admission and as needed Assess ulceration(s) every visit Provide education on ulcer and skin care Nicholas Leonard, Nicholas Leonard ([id number]) Treatment Activities: Patient referred to home care : 11/12/2014 Skin care regimen initiated : 11/12/2014 Topical wound management  initiated : 11/12/2014 Notes: Electronic Signature(s) Signed: 11/12/2014 1:13:55 PM By: Elpidio Eric BSN, RN Entered By: Elpidio Eric on 11/12/2014 13:13:54 Nicholas Leonard  ([id number]) -------------------------------------------------------------------------------- Pain Assessment Details Patient Name: Nicholas Leonard, Nicholas Leonard. Date of Service: 11/12/2014 1:00 PM Medical Record Number: [id number] Patient Account Number: [id number] Date of Birth/Sex: 06-11-1945 (69 y.o. Male) Treating RN: Clover Mealy, RN, BSN, [place] Sink Primary Care Physician: Daniel Nones Other Clinician: Referring Physician: Daniel Nones Treating Physician/Extender: Rudene Re in Treatment: 18 Active Problems Location of Pain Severity and Description of Pain Patient Has Paino No Site Locations Pain Management and Medication Current Pain Management: Electronic Signature(s) Signed: 11/12/2014 1:07:37 PM By: Elpidio Eric BSN, RN Entered By: Elpidio Eric on 11/12/2014 13:07:37 Nicholas Leonard ([id number]) -------------------------------------------------------------------------------- Patient/Caregiver Education Details Patient Name: Nicholas Leonard Date of Service: 11/12/2014 1:00 PM Medical Record Number: [id number] Patient Account Number: [id number] Date of Birth/Gender: 04-18-1945 (69 y.o. Male) Treating RN: Clover Mealy, RN, BSN, [place] Sink Primary Care Physician: Daniel Nones Other Clinician: Referring Physician: Daniel Nones Treating Physician/Extender: Rudene Re in Treatment: 80 Education Assessment Education Provided To: Patient Education Topics Provided Elevated Blood Sugar/ Impact on Healing: Nutrition: Methods: Explain/Verbal Responses: State content correctly Venous: Methods: Explain/Verbal Responses: State content correctly Welcome To The [facility]: Methods: Explain/Verbal Responses: State content correctly Wound/Skin Impairment: Methods: Explain/Verbal Responses: State content correctly Electronic Signature(s) Signed: 11/12/2014 1:27:28 PM By: Elpidio Eric BSN, RN Entered By: Elpidio Eric on 11/12/2014 13:27:28 Nicholas Leonard  ([id number]) -------------------------------------------------------------------------------- Wound Assessment Details Patient Name: Nicholas Leonard. Date of Service: 11/12/2014 1:00 PM Medical Record Number: [id number] Patient Account Number: [id number] Date of Birth/Sex: 07/11/1945 (69 y.o. 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 - Medial Secondary Diabetic Wound/Ulcer of the Lower Wounding Event: Gradually Appeared Etiology: Extremity Date Acquired: 06/10/2014 Wound Open Weeks Of Treatment: 18 Status: Clustered Wound: No Comorbid Congestive Heart Failure, Coronary History: Artery Disease, Hypertension, Type II Diabetes, Gout, Neuropathy Photos Photo Uploaded By: Elpidio Eric on 11/12/2014 16:59:18 Wound Measurements Length: (cm) 0.4 Width: (cm) 0.4 Depth: (cm) 0.1 Area: (cm) 0.126 Volume: (cm) 0.013 % Reduction in Area: 90% % Reduction in Volume: 89.7% Epithelialization: Large (67-100%) Tunneling: No Undermining: No Wound Description Classification: Partial Thickness Foul O Diabetic Severity (Wagner): Grade 1 Wound Margin: Distinct, outline attached Exudate Amount: Small Exudate Type: Serous Exudate Color: amber dor After Cleansing: No Wound Bed Granulation Amount: Large (67-100%) Exposed Structure Lehnert, Nicholas E. ([id number]) Granulation Quality: Red, Hyper-granulation Fascia Exposed: No Necrotic Amount: None Present (0%) Fat Layer Exposed: No Tendon Exposed: No Muscle Exposed: No Joint Exposed: No Bone Exposed: No Limited to Skin Breakdown Periwound Skin Texture Texture Color No Abnormalities Noted: No No Abnormalities Noted: No Callus: No Atrophie Blanche: No Crepitus: No Cyanosis:  No Excoriation: No Ecchymosis: No Fluctuance: No Erythema: No Friable: No Hemosiderin Staining: No Induration: No Mottled: No Localized Edema: Yes Pallor: No Rash: No Rubor: No Scarring: No Temperature / Pain Moisture Temperature: No Abnormality No Abnormalities Noted: No Tenderness on Palpation: Yes Dry / Scaly: No Maceration: No Moist: No Wound Preparation Ulcer Cleansing: Other: soap and water, Topical Anesthetic Applied: None Treatment Notes Wound #1 (Right, Medial Lower Leg) 1. Cleansed with: Cleanse wound with antibacterial soap and water 3. Peri-wound Care: Skin Prep 4. Dressing Applied: Promogran 5. Secondary Dressing Applied ABD  Pad 7. Secured with 2 Layer Lite Compression System - Right Lower Extremity Electronic Signature(s) Signed: 11/12/2014 1:13:47 PM By: Elpidio Eric BSN, RN Entered By: Elpidio Eric on 11/12/2014 13:13:47 Nicholas Leonard ([id number]) Nicholas Leonard, [name] ([id number]) -------------------------------------------------------------------------------- Vitals Details Patient Name: Nicholas Leonard. Date of Service: 11/12/2014 1:00 PM Medical Record Number: [id number] Patient Account Number: [id number] Date of Birth/Sex: 1945-09-18 (68 y.o. Male) Treating RN: Clover Mealy, RN, BSN, [place] Primary Care Physician: Daniel Nones Other Clinician: Referring Physician: Daniel Nones Treating Physician/Extender: Rudene Re in Treatment: 18 Vital Signs Time Taken: 13:07 Temperature (F): 97.9 Height (in): 72 Pulse (bpm): 83 Weight (lbs): 215 Respiratory Rate (breaths/min): 17 Body Mass Index (BMI): 29.2 Blood Pressure (mmHg): 107/69 Reference Range: 80 - 120 mg / dl Electronic Signature(s) Signed: 11/12/2014 1:08:01 PM By: Elpidio Eric BSN, RN Entered By: Elpidio Eric on 11/12/2014 13:08:01

## 2014-11-19 ENCOUNTER — Encounter: Payer: Medicare Other | Admitting: Surgery

## 2014-11-19 DIAGNOSIS — L97911 Non-pressure chronic ulcer of unspecified part of right lower leg limited to breakdown of skin: Secondary | ICD-10-CM | POA: Diagnosis not present

## 2014-11-20 NOTE — Progress Notes (Signed)
Nicholas, Leonard ([id number]) Visit Report for 11/19/2014 Arrival Information Details Patient Name: Nicholas Leonard, Nicholas Leonard. Date of Service: 11/19/2014 1:45 PM Medical Record Number: [id number] Patient Account Number: [id number] Date of Birth/Sex: 27-Nov-1945 (69 y.o. Male) Treating RN: Curtis Sites Primary Care Physician: Daniel Nones Other Clinician: Referring Physician: Daniel Nones Treating Physician/Extender: Rudene Re in Treatment: 23 Visit Information History Since Last Visit Added or deleted any medications: No Patient Arrived: Ambulatory Any new allergies or adverse reactions: No Arrival Time: 14:13 Had a fall or experienced change in No Accompanied By: spouse activities of daily living that may affect Transfer Assistance: None risk of falls: Patient Identification Verified: Yes Signs or symptoms of abuse/neglect since last No Secondary Verification Process Yes visito Completed: Hospitalized since last visit: No Patient Requires Transmission- No Pain Present Now: No Based Precautions: Patient Has Alerts: Yes Patient Alerts: Patient on Blood Thinner Type II Diabetic ABI (L) 1.75 (R) 1.55 Electronic Signature(s) Signed: 11/19/2014 5:02:12 PM By: Curtis Sites Entered By: Curtis Sites on 11/19/2014 14:14:12 Benyo, [name] ([id number]) -------------------------------------------------------------------------------- Clinic Level of Care Assessment Details Patient Name: Nicholas Leonard Date of Service: 11/19/2014 1:45 PM Medical Record Number: [id number] Patient Account Number: [id number] Date of Birth/Sex: [birth year] (69 y.o. Male) Treating RN: Curtis Sites Primary Care Physician: Daniel Nones Other Clinician: Referring Physician: Daniel Nones Treating Physician/Extender: Rudene Re in Treatment: 19 Clinic Level of Care Assessment Items TOOL 4 Quantity Score  - Use when only an EandM is performed on FOLLOW-UP visit 0 ASSESSMENTS - Nursing  Assessment / Reassessment X - Reassessment of Co-morbidities (includes updates in patient status) 1 10 X - Reassessment of Adherence to Treatment Plan 1 5 ASSESSMENTS - Wound and Skin Assessment / Reassessment  - Simple Wound Assessment / Reassessment - one wound 0  - Complex Wound Assessment / Reassessment - multiple wounds 0  - Dermatologic / Skin Assessment (not related to wound area) 0 ASSESSMENTS - Focused Assessment X - Circumferential Edema Measurements - multi extremities 1 5  - Nutritional Assessment / Counseling / Intervention 0 X - Lower Extremity Assessment (monofilament, tuning fork, pulses) 1 5  - Peripheral Arterial Disease Assessment (using hand held doppler) 0 ASSESSMENTS - Ostomy and/or Continence Assessment and Care  - Incontinence Assessment and Management 0  - Ostomy Care Assessment and Management (repouching, etc.) 0 PROCESS - Coordination of Care X - Simple Patient / Family Education for ongoing care 1 15  - Complex (extensive) Patient / Family Education for ongoing care 0  - Staff obtains Chiropractor, Records, Test Results / Process Orders 0  - Staff telephones HHA, Nursing Homes / Clarify orders / etc 0  - Routine Transfer to another Facility (non-emergent condition) 0 RAYSHON, ALBAUGH ([id number])  - Routine Hospital Admission (non-emergent condition) 0  - New Admissions / Manufacturing engineer / Ordering NPWT, Apligraf, etc. 0  - Emergency Hospital Admission (emergent condition) 0 X - Simple Discharge Coordination 1 10  - Complex (extensive) Discharge Coordination 0 PROCESS - Special Needs  - Pediatric / Minor Patient Management 0  - Isolation Patient Management 0  - Hearing / Language / Visual special needs 0  - Assessment of Community assistance (transportation, D/C planning, etc.) 0  - Additional assistance / Altered mentation 0  - Support Surface(s) Assessment (bed, cushion, seat, etc.) 0 INTERVENTIONS - Wound  Cleansing / Measurement  - Simple Wound Cleansing - one wound 0  - Complex Wound Cleansing - multiple wounds 0  - Wound Imaging (photographs - any  number of wounds) 0 []  - Wound Tracing (instead of photographs) 0 []  - Simple Wound Measurement - one wound 0 []  - Complex Wound Measurement - multiple wounds 0 INTERVENTIONS - Wound Dressings []  - Small Wound Dressing one or multiple wounds 0 []  - Medium Wound Dressing one or multiple wounds 0 []  - Large Wound Dressing one or multiple wounds 0 []  - Application of Medications - topical 0 []  - Application of Medications - injection 0 INTERVENTIONS - Miscellaneous []  - External ear exam 0 Haas, Richie E. ([id number]) []  - Specimen Collection (cultures, biopsies, blood, body fluids, etc.) 0 []  - Specimen(s) / Culture(s) sent or taken to Lab for analysis 0 []  - Patient Transfer (multiple staff / Michiel Sites Lift / Similar devices) 0 []  - Simple Staple / Suture removal (25 or less) 0 []  - Complex Staple / Suture removal (26 or more) 0 []  - Hypo / Hyperglycemic Management (close monitor of Blood Glucose) 0 []  - Ankle / Brachial Index (ABI) - do not check if billed separately 0 X - Vital Signs 1 5 Has the patient been seen at the hospital within the last three years: Yes Total Score: 55 Level Of Care: New/Established - Level 2 Electronic Signature(s) Signed: 11/19/2014 5:02:12 PM By: Curtis Sites Entered By: Curtis Sites on 11/19/2014 14:31:12 Nicholas Leonard ([id number]) -------------------------------------------------------------------------------- Encounter Discharge Information Details Patient Name: Nicholas Leonard. Date of Service: 11/19/2014 1:45 PM Medical Record Number: [id number] Patient Account Number: [id number] Date of Birth/Sex: 08-07-1945 (69 y.o. Male) Treating RN: Curtis Sites Primary Care Physician: Daniel Nones Other Clinician: Referring Physician: Daniel Nones Treating Physician/Extender: Rudene Re in  Treatment: 72 Encounter Discharge Information Items Discharge Pain Level: 0 Discharge Condition: Stable Ambulatory Status: Ambulatory Discharge Destination: Home Private Transportation: Auto Accompanied By: spouse Schedule Follow-up Appointment: Yes Medication Reconciliation completed and No provided to Patient/Care Kareli Hossain: Clinical Summary of Care: Electronic Signature(s) Signed: 11/19/2014 5:02:12 PM By: Curtis Sites Entered By: Curtis Sites on 11/19/2014 14:33:03 Nicholas Leonard ([id number]) -------------------------------------------------------------------------------- Lower Extremity Assessment Details Patient Name: Nicholas Leonard. Date of Service: 11/19/2014 1:45 PM Medical Record Number: [id number] Patient Account Number: [id number] Date of Birth/Sex: 28-Jul-1945 (69 y.o. Male) Treating RN: Curtis Sites Primary Care Physician: Daniel Nones Other Clinician: Referring Physician: Daniel Nones Treating Physician/Extender: Rudene Re in Treatment: 19 Edema Assessment Assessed: [Left: No] [Right: No] Edema: [Left: Ye] [Right: s] Calf Left: Right: Point of Measurement: 39 cm From Medial Instep cm 35.8 cm Ankle Left: Right: Point of Measurement: 12 cm From Medial Instep cm 23.5 cm Vascular Assessment Pulses: Posterior Tibial Palpable: [Right:Yes] Dorsalis Pedis Palpable: [Right:Yes] Extremity colors, hair growth, and conditions: Extremity Color: [Right:Hyperpigmented] Hair Growth on Extremity: [Right:No] Temperature of Extremity: [Right:Warm] Capillary Refill: [Right:< 3 seconds] Toe Nail Assessment Left: Right: Thick: Yes Discolored: Yes Deformed: Yes Improper Length and Hygiene: Yes Electronic Signature(s) Signed: 11/19/2014 5:02:12 PM By: Curtis Sites Entered By: Curtis Sites on 11/19/2014 14:21:52 Kell, [name] ([id number]) [name], [name]  ([id number]) -------------------------------------------------------------------------------- Multi-Disciplinary Care Plan Details Patient Name: PRINCE, COUEY. Date of Service: 11/19/2014 1:45 PM Medical Record Number: [id number] Patient Account Number: [id number] Date of Birth/Sex: 21-Jun-1945 (69 y.o. Male) Treating RN: Curtis Sites Primary Care Physician: Daniel Nones Other Clinician: Referring Physician: Daniel Nones Treating Physician/Extender: Rudene Re in Treatment: 66 Active Inactive Electronic Signature(s) Signed: 11/19/2014 5:02:12 PM By: Curtis Sites Entered By: Curtis Sites on 11/19/2014 14:29:18 Nicholas Leonard ([id number]) -------------------------------------------------------------------------------- Patient/Caregiver Education Details Patient Name: Nicholas Leonard Date of  Service: 11/19/2014 1:45 PM Medical Record Number: [id number] Patient Account Number: [id number] Date of Birth/Gender: 21-May-1945 (69 y.o. Male) Treating RN: Curtis Sites Primary Care Physician: Daniel Nones Other Clinician: Referring Physician: Daniel Nones Treating Physician/Extender: Rudene Re in Treatment: 45 Education Assessment Education Provided To: Patient and Caregiver Education Topics Provided Venous: Handouts: Other: need for compression ongoing Methods: Explain/Verbal Responses: State content correctly Electronic Signature(s) Signed: 11/19/2014 5:02:12 PM By: Curtis Sites Entered By: Curtis Sites on 11/19/2014 14:33:27 Roderick, [name] ([id number]) -------------------------------------------------------------------------------- Wound Assessment Details Patient Name: Nicholas Leonard. Date of Service: 11/19/2014 1:45 PM Medical Record Number: [id number] Patient Account Number: [id number] Date of Birth/Sex: 1945/05/22 (69 y.o. 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 Etiology: Lower Extremity Date Acquired: 06/10/2014 Wound Status: Open Weeks Of Treatment: 19 Clustered Wound: No Photos Photo Uploaded By: Curtis Sites on 11/19/2014 16:33:45 Wound Measurements Length: (cm) 0 Width: (cm) 0 Depth: (cm) 0 Area: (cm) 0 Volume: (cm) 0 % Reduction in Area: 100% % Reduction in Volume: 100% Wound Description Classification: Partial Thickness Periwound Skin Texture Texture Color No Abnormalities Noted: No No Abnormalities Noted: No Moisture No Abnormalities Noted: No Electronic Signature(s) Signed: 11/19/2014 5:02:12 PM By: Jamey Reas, [name] ([id number]) Entered By: Curtis Sites on 11/19/2014 14:23:20 Nicholas Leonard ([id number]) -------------------------------------------------------------------------------- Vitals Details Patient Name: Nicholas Leonard. Date of Service: 11/19/2014 1:45 PM Medical Record Number: [id number] Patient Account Number: [id number] Date of Birth/Sex: 09/09/1945 (69 y.o. Male) Treating RN: Curtis Sites Primary Care Physician: Daniel Nones Other Clinician: Referring Physician: Daniel Nones Treating Physician/Extender: Rudene Re in Treatment: 37 Vital Signs Time Taken: 14:14 Temperature (F): 97.8 Height (in): 72 Pulse (bpm): 78 Weight (lbs): 215 Respiratory Rate (breaths/min): 18 Body Mass Index (BMI): 29.2 Blood Pressure (mmHg): 97/58 Reference Range: 80 - 120 mg / dl Electronic Signature(s) Signed: 11/19/2014 5:02:12 PM By: Curtis Sites Entered By: Curtis Sites on 11/19/2014 14:16:23

## 2014-11-20 NOTE — Progress Notes (Signed)
SELAH, ZELMAN ([id number]) Visit Report for 11/19/2014 Chief Complaint Document Details Patient Name: Nicholas Leonard, Nicholas Leonard. Date of Service: 11/19/2014 1:45 PM Medical Record Number: [id number] Patient Account Number: [id number] Date of Birth/Sex: 23-Jun-1945 (69 y.o. Male) Treating RN: Primary Care Physician: Daniel Nones Other Clinician: Referring Physician: Daniel Nones Treating Physician/Extender: Rudene Re in Treatment: 7 Information Obtained from: Patient Chief Complaint Patient presents to the wound care center for a consult due non healing wound ulcer on the right lower limb which was noted to be there for about 3 weeks now. he is also had swelling of both lower extremities for about 2 months now. Electronic Signature(s) Signed: 11/19/2014 2:32:12 PM By: Evlyn Kanner MD, FACS Entered By: Evlyn Kanner on 11/19/2014 14:32:12 [name] ([id number]) -------------------------------------------------------------------------------- HPI Details Patient Name: Nicholas Leonard. Date of Service: 11/19/2014 1:45 PM Medical Record Number: [id number] Patient Account Number: [id number] Date of Birth/Sex: 1946-03-12 (69 y.o. Male) Treating RN: Primary Care Physician: Daniel Nones Other Clinician: Referring Physician: Daniel Nones Treating Physician/Extender: Rudene Re in Treatment: 9 History of Present Illness HPI Description: 69 year old gentleman who is recently discharged from [facility] and was admitted between 3 18-3/22 by his primary care physician Dr. Daniel Nones. He has adult onset diabetes and was recently admitted for acute on chronic left-sided systolic congestive heart failure, acute on chronic renal failure, coronary artery disease, ischemic cardiomyopathy, ventricular fibrillation status post AICD implantation, history of atrial fibrillation on Coumadin therapy, hypothyroidism, history of venous stasis ulcer, pulmonary embolism with deep  vein thrombosis on chronic Coumadin therapy, osteoarthritis and gout. He does not smoke any longer and occasionally has alcohol. as not been compliant with getting his blood sugars checked and does not know the last time he had a hemoglobin A1c done. Does not recall when the last ultrasound of his lower extremities was done but he does say that he has had clots in his legs. 07/30/2014 the patient is back to [place] after a recent admission and discharge from the hospital and he was there between 07/19/2014 and 07/26/2014. Reviewed these notes and his final diagnosis was #1 elevated INR, #2 acute on chronic left-sided systolic congestive heart failure, #3 hypothyroidism, #4 gout, #5 chronic kidney disease stage III, #6 acute renal failure, #7 carotid artery disease, #8 history of pulmonary embolism and DVT, #9 adult onset diabetes mellitus. During the admission he was corrected with giving vitamin K and due to his difficulty with Coumadin he was changed over to Eliquis. The patient also was found to have diffuse edema and there was evidence of ascites on abdominal ultrasound and was placed on a Lasix drip by nephrologist. His just discharge medications were noted by me. they have been using Santyl on his right lower extremity wound and he has been doing this dressing as often as the nurse comes in. he has also developed a ulcer of the left thigh and this has been there for about 2 weeks and has some necrosis of skin over there. 08/23/2014 -- he recently saw the vascular surgeon Dr. Gilda Crease and has had both an arterial and duplex venous study done. Though we do not have the official report yet the patient tells me that his arteries were okay and there was no problem with the blood flow. 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 08/30/2014 -- his vascular surgery has been scheduled for Tuesday, May 24 and he will  come back and see [place] if he needs to soon after that for a rewrap. Addendum: We have finally received his transcript of his office visit with Dr. Gilda Crease on 08/22/2014 and note that the duplex US of the lower extremity arterial system bilaterally SFA are patent, with diffuse tibial disease with monophasic tibial signals on the right and biphasic signals on the left. Duplex Venous ultrasound of the lower extremity shows normal deep venous system, superficial reflux is present over a short segment of the GSV in the region of the ulcer. However the GSV from the knees to the groin is competent. The recommendations were for the patient to have angiography of the lower extremity with the hope of KADE, DEMICCO. ([id number]) intervention for limb salvage. Once the arterial compromise has been addressed then the venous portion can be more effectively treated. 09/05/2014 - s/p RLE angiogram without intervention 09/03/2014 by Dr Gilda Crease. No new complaints today. No significant pain. No fever or chills. Minimal drainage. 09/13/2014 -- On 09/03/2014 o Procedure(s) Performed: 1. Abdominal aortogram 2. Right lower extremity distal runoff third order catheter placement 3. Additional right third order catheter placement as per the patient the vascular surgeon will see him back in 6 months and no other procedures planned for in the near future. 09/27/2014 -- He is here for his first application of a EpiFix which was approved by his insurance company. 10/10/2014 -- He is for his second application of a Epifix. He is doing fine and has no complaints 10/25/2014 he is here for his third application of Epifix and is otherwise doing fine. 11/12/2014 -- he has again developed a lot of edema and his PCP has put him on 2 diuretics. 11/19/2014 -- he worked with his nephrologist and his edema has gone down remarkably after he put him on the proper diuretics. Electronic Signature(s) Signed: 11/19/2014 2:32:57 PM By:  Evlyn Kanner MD, FACS Entered By: Evlyn Kanner on 11/19/2014 14:32:57 [name] ([id number]) -------------------------------------------------------------------------------- Physical Exam Details Patient Name: MAXIMIANO, LOTT. Date of Service: 11/19/2014 1:45 PM Medical Record Number: [id number] Patient Account Number: [id number] Date of Birth/Sex: 05-10-1945 (69 y.o. Male) Treating RN: Primary Care Physician: Daniel Nones Other Clinician: Referring Physician: Daniel Nones Treating Physician/Extender: Rudene Re in Treatment: 19 Constitutional . Pulse regular. Respirations normal and unlabored. Afebrile. . Eyes Nonicteric. Reactive to light. Ears, Nose, Mouth, and Throat Lips, teeth, and gums WNL.Marland Kitchen Moist mucosa without lesions . Neck supple and nontender. No palpable supraclavicular or cervical adenopathy. Normal sized without goiter. Respiratory WNL. No retractions.. Cardiovascular Pedal Pulses WNL. No clubbing, cyanosis or edema. Chest Breasts symmetical and no nipple discharge.. Breast tissue WNL, no masses, lumps, or tenderness.. Lymphatic No adneopathy. No adenopathy. No adenopathy. Musculoskeletal Adexa without tenderness or enlargement.. Digits and nails w/o clubbing, cyanosis, infection, petechiae, ischemia, or inflammatory conditions.. Integumentary (Hair, Skin) No suspicious lesions. No crepitus or fluctuance. No peri-wound warmth or erythema. No masses.Marland Kitchen Psychiatric Judgement and insight Intact.. No evidence of depression, anxiety, or agitation.. Notes The wound has completely healed and the edema has gone down remarkably. Electronic Signature(s) Signed: 11/19/2014 2:33:28 PM By: Evlyn Kanner MD, FACS Entered By: Evlyn Kanner on 11/19/2014 14:33:27 [name] ([id number]) -------------------------------------------------------------------------------- Physician Orders Details Patient Name: KELE, BARTHELEMY. Date of Service: 11/19/2014  1:45 PM Medical Record Number: [id number] Patient Account Number: [id number] Date of Birth/Sex: November 27, 1945 (69 y.o. Male) Treating RN: Curtis Sites Primary Care Physician: Daniel Nones Other Clinician: Referring Physician: Daniel Nones Treating Physician/Extender: Rudene Re  in Treatment: 19 Verbal / Phone Orders: Yes Clinician: Curtis Sites Read Back and Verified: Yes Diagnosis Coding Discharge From [facility] Services o Discharge from Wound Care Center Electronic Signature(s) Signed: 11/19/2014 4:14:41 PM By: Evlyn Kanner MD, FACS Signed: 11/19/2014 5:02:12 PM By: Curtis Sites Entered By: Curtis Sites on 11/19/2014 14:30:52 Adolf, [name] ([id number]) -------------------------------------------------------------------------------- Problem List Details Patient Name: NICODEMUS, DENK. Date of Service: 11/19/2014 1:45 PM Medical Record Number: [id number] Patient Account Number: [id number] Date of Birth/Sex: 1946-01-02 (69 y.o. 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 Date of Service: 11/19/2014 1:45 PM Medical Record Number: [id number] Patient Account Number: [id number] Date of Birth/Sex: [birth year] (69 y.o. Male) Treating RN: Primary Care Physician: Daniel Nones Other Clinician: Referring Physician: Daniel Nones Treating Physician/Extender: Rudene Re in Treatment: 40 Subjective Chief Complaint Information obtained from Patient Patient presents to the wound care center for a consult due non healing wound ulcer on the right lower limb which was noted to be there for about 3 weeks now. he is also had swelling of both lower extremities for about 2 months now. History of Present Illness (HPI) 69 year old gentleman who is recently discharged from [facility] and was admitted between 3 18-3/22 by his primary care physician Dr. Daniel Nones. He has adult onset diabetes and was recently admitted for acute on chronic left-sided systolic congestive heart failure, acute on chronic renal failure, coronary artery disease, ischemic cardiomyopathy, ventricular fibrillation status post AICD implantation, history of atrial fibrillation on Coumadin therapy, hypothyroidism, history of venous stasis ulcer, pulmonary embolism with deep vein thrombosis on chronic Coumadin therapy, osteoarthritis and gout. He does not smoke any longer and occasionally has alcohol. as not been compliant with getting his blood sugars checked and does not know the last time he had a hemoglobin A1c done. Does not recall when the last ultrasound of his lower extremities was done but he does say that he has had clots in his legs. 07/30/2014 the patient is back to [place] after a recent admission and discharge from the hospital and  he was there between 07/19/2014 and 07/26/2014. Reviewed these notes and his final diagnosis was #1 elevated INR, #2 acute on chronic left-sided systolic congestive heart failure, #3 hypothyroidism, #4 gout, #5 chronic kidney disease stage III, #6 acute renal failure, #7 carotid artery disease, #8 history of pulmonary embolism and DVT, #9 adult onset diabetes mellitus. During the admission he was corrected with giving vitamin K and due to his difficulty with Coumadin he was changed over to Eliquis. The patient also was found to have diffuse edema and there was evidence of ascites on abdominal ultrasound and was  placed on a Lasix drip by nephrologist. His just discharge medications were noted by me. they have been using Santyl on his right lower extremity wound and he has been doing this dressing as often as the nurse comes in. he has also developed a ulcer of the left thigh and this has been there for about 2 weeks and has some necrosis of skin over there. 08/23/2014 -- he recently saw the vascular surgeon Dr. Gilda Crease and has had both an arterial and duplex venous study done. Though we do not have the official report yet the patient tells me that his arteries were okay and there was no problem with the blood flow. 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 08/30/2014 -- his vascular surgery has been scheduled for Tuesday, May 24 and he will come back and see TRAVIS, MASTEL ([id number]) [place] if he needs to soon after that for a rewrap. Addendum: We have finally received his transcript of his office visit with Dr. Gilda Crease on 08/22/2014 and note that the duplex US of the lower extremity arterial system bilaterally SFA are patent, with diffuse tibial disease with monophasic tibial signals on the right and biphasic signals on the left. Duplex Venous ultrasound of the lower extremity shows normal deep venous  system, superficial reflux is present over a short segment of the GSV in the region of the ulcer. However the GSV from the knees to the groin is competent. The recommendations were for the patient to have angiography of the lower extremity with the hope of intervention for limb salvage. Once the arterial compromise has been addressed then the venous portion can be more effectively treated. 09/05/2014 - s/p RLE angiogram without intervention 09/03/2014 by Dr Gilda Crease. No new complaints today. No significant pain. No fever or chills. Minimal drainage. 09/13/2014 -- On 09/03/2014 Procedure(s) Performed: 1. Abdominal aortogram 2. Right lower extremity distal runoff third order catheter placement 3. Additional right third order catheter placement as per the patient the vascular surgeon will see him back in 6 months and no other procedures planned for in the near future. 09/27/2014 -- He is here for his first application of a EpiFix which was approved by his insurance company. 10/10/2014 -- He is for his second application of a Epifix. He is doing fine and has no complaints 10/25/2014 he is here for his third application of Epifix and is otherwise doing fine. 11/12/2014 -- he has again developed a lot of edema and his PCP has put him on 2 diuretics. 11/19/2014 -- he worked with his nephrologist and his edema has gone down remarkably after he put him on the proper diuretics. Objective Constitutional Pulse regular. Respirations normal and unlabored. Afebrile. Vitals Time Taken: 2:14 PM, Height: 72 in, Weight: 215 lbs, BMI: 29.2, Temperature: 97.8 F, Pulse: 78 bpm, Respiratory Rate: 18 breaths/min, Blood Pressure: 97/58 mmHg. Eyes Nonicteric. Reactive to light. Ears, Nose, Mouth, and Throat Lips, teeth, and gums WNL.Marland Kitchen Moist mucosa without lesions . Neck supple and nontender. No palpable supraclavicular or cervical adenopathy. Normal sized without goiter. DELMAN, GOSHORN  ([id number]) Respiratory WNL. No retractions.. Cardiovascular Pedal Pulses WNL. No clubbing, cyanosis or edema. Chest Breasts symmetical and no nipple discharge.. Breast tissue WNL, no masses, lumps, or tenderness.. Lymphatic No adneopathy. No adenopathy. No adenopathy. Musculoskeletal Adexa without tenderness or enlargement.. Digits and nails w/o clubbing, cyanosis, infection, petechiae, ischemia, or inflammatory conditions.Marland Kitchen Psychiatric Judgement and insight Intact.Marland Kitchen No  evidence of depression, anxiety, or agitation.. General Notes: The wound has completely healed and the edema has gone down remarkably. Integumentary (Hair, Skin) No suspicious lesions. No crepitus or fluctuance. No peri-wound warmth or erythema. No masses.. Wound #1 status is Open. Original cause of wound was Gradually Appeared. The wound is located on the Right,Medial Lower Leg. The wound measures 0cm length x 0cm width x 0cm depth; 0cm^2 area and 0cm^3 volume. 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 ([id number]) I have discussed with him his management of edema of the lower extremities and the fact that he is to use Jobst stockings of the 20-30 mm variety at all times except in bed. He is discharged from the wound care services and will be seen back as needed. Plan Discharge From [facility]: Discharge from Wound Care Center I have discussed with him his management of edema of the lower extremities  and the fact that he is to use Jobst stockings of the 20-30 mm variety at all times except in bed. He is discharged from the wound care services and will be seen back as needed. Electronic Signature(s) Signed: 11/19/2014 2:44:50 PM By: Evlyn Kanner MD, FACS Previous Signature: 11/19/2014 2:34:18 PM Version By: Evlyn Kanner MD, FACS Entered By: Evlyn Kanner on 11/19/2014 14:44:50 [name] ([id number]) -------------------------------------------------------------------------------- SuperBill Details Patient Name: HISAO, DOO. Date of Service: 11/19/2014 Medical Record Number: [id number] Patient Account Number: [id number] Date of Birth/Sex: [birth year] (69 y.o. 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 L97.122 Non-pressure chronic ulcer of left thigh with fat layer exposed I70.232 Atherosclerosis of native arteries of right leg with ulceration of calf Facility Procedures CPT4 Code: 62952841 Description: 32440 - WOUND CARE VISIT-LEV 2 EST PT Modifier: Quantity: 1 Physician Procedures CPT4 Code: 1027253 Description: 99213 - WC PHYS LEVEL 3 - EST PT ICD-10 Description Diagnosis E11.622 Type 2 diabetes mellitus with other skin ulcer L97.122 Non-pressure chronic ulcer of left thigh with fat lay I87.011 Postthrombotic syndrome with ulcer of right lower ext Modifier: er exposed remity Quantity: 1 Electronic Signature(s) Signed: 11/19/2014 2:45:08 PM By: Evlyn Kanner  MD, FACS Entered By: Evlyn Kanner on 11/19/2014 14:45:08

## 2014-11-26 ENCOUNTER — Ambulatory Visit: Payer: Self-pay | Admitting: Surgery

## 2014-12-20 ENCOUNTER — Encounter: Payer: Medicare Other | Attending: Surgery | Admitting: Surgery

## 2014-12-20 DIAGNOSIS — E11622 Type 2 diabetes mellitus with other skin ulcer: Secondary | ICD-10-CM | POA: Insufficient documentation

## 2014-12-20 DIAGNOSIS — Z95810 Presence of automatic (implantable) cardiac defibrillator: Secondary | ICD-10-CM | POA: Insufficient documentation

## 2014-12-20 DIAGNOSIS — I5021 Acute systolic (congestive) heart failure: Secondary | ICD-10-CM | POA: Diagnosis not present

## 2014-12-20 DIAGNOSIS — I255 Ischemic cardiomyopathy: Secondary | ICD-10-CM | POA: Insufficient documentation

## 2014-12-20 DIAGNOSIS — N183 Chronic kidney disease, stage 3 (moderate): Secondary | ICD-10-CM | POA: Insufficient documentation

## 2014-12-20 DIAGNOSIS — I80291 Phlebitis and thrombophlebitis of other deep vessels of right lower extremity: Secondary | ICD-10-CM | POA: Diagnosis not present

## 2014-12-20 DIAGNOSIS — L97122 Non-pressure chronic ulcer of left thigh with fat layer exposed: Secondary | ICD-10-CM | POA: Diagnosis not present

## 2014-12-20 DIAGNOSIS — L97911 Non-pressure chronic ulcer of unspecified part of right lower leg limited to breakdown of skin: Secondary | ICD-10-CM | POA: Insufficient documentation

## 2014-12-20 DIAGNOSIS — E039 Hypothyroidism, unspecified: Secondary | ICD-10-CM | POA: Insufficient documentation

## 2014-12-20 DIAGNOSIS — E1122 Type 2 diabetes mellitus with diabetic chronic kidney disease: Secondary | ICD-10-CM | POA: Insufficient documentation

## 2014-12-20 DIAGNOSIS — I87311 Chronic venous hypertension (idiopathic) with ulcer of right lower extremity: Secondary | ICD-10-CM | POA: Diagnosis not present

## 2014-12-20 DIAGNOSIS — I70232 Atherosclerosis of native arteries of right leg with ulceration of calf: Secondary | ICD-10-CM | POA: Diagnosis not present

## 2014-12-20 DIAGNOSIS — I87011 Postthrombotic syndrome with ulcer of right lower extremity: Secondary | ICD-10-CM | POA: Insufficient documentation

## 2014-12-20 DIAGNOSIS — Z86711 Personal history of pulmonary embolism: Secondary | ICD-10-CM | POA: Diagnosis not present

## 2014-12-20 DIAGNOSIS — M1990 Unspecified osteoarthritis, unspecified site: Secondary | ICD-10-CM | POA: Diagnosis not present

## 2014-12-20 DIAGNOSIS — I4891 Unspecified atrial fibrillation: Secondary | ICD-10-CM | POA: Diagnosis not present

## 2014-12-20 DIAGNOSIS — I2510 Atherosclerotic heart disease of native coronary artery without angina pectoris: Secondary | ICD-10-CM | POA: Diagnosis not present

## 2014-12-20 DIAGNOSIS — Z7901 Long term (current) use of anticoagulants: Secondary | ICD-10-CM | POA: Diagnosis not present

## 2014-12-21 NOTE — Progress Notes (Addendum)
BROWN, DUNLAP ([id number]) Visit Report for 12/20/2014 Arrival Information Details Patient Name: ERIAN, ROSENGREN. Date of Service: 12/20/2014 10:15 AM Medical Record Number: [id number] Patient Account Number: [id number] Date of Birth/Sex: [birth year] (69 y.o. Male) Treating RN: Huel Coventry Primary Care Physician: Daniel Nones Other Clinician: Referring Physician: Daniel Nones Treating Physician/Extender: Rudene Re in Treatment: 23 Visit Information History Since Last Visit Pain Present Now: No Patient Arrived: Ambulatory Arrival Time: 10:26 Accompanied By: self Transfer Assistance: None Patient Identification Verified: Yes Secondary Verification Process Yes Completed: Patient Requires Transmission- No Based Precautions: Patient Has Alerts: Yes Patient Alerts: Patient on Blood Thinner Type II Diabetic ABI (L) 1.75 (R) 1.55 Electronic Signature(s) Signed: 12/20/2014 5:06:19 PM By: Elliot Gurney, RN, BSN, Kim RN, BSN Entered By: Elliot Gurney, RN, BSN, Kim on 12/20/2014 10:26:56 [name] ([id number]) -------------------------------------------------------------------------------- Clinic Level of Care Assessment Details Patient Name: KENDARIUS, VIGEN. Date of Service: 12/20/2014 10:15 AM Medical Record Number: [id number] Patient Account Number: [id number] Date of Birth/Sex: 09/10/1945 (69 y.o. Male) Treating RN: Huel Coventry Primary Care Physician: Daniel Nones Other Clinician: Referring Physician: Daniel Nones Treating Physician/Extender: Rudene Re in Treatment: 23 Clinic Level of Care Assessment Items TOOL 4 Quantity Score  - Use when only an EandM is performed on FOLLOW-UP visit 0 ASSESSMENTS - Nursing Assessment / Reassessment  - Reassessment of Co-morbidities (includes updates in patient status) 0 X - Reassessment of Adherence to Treatment Plan 1 5 ASSESSMENTS - Wound and Skin Assessment / Reassessment X - Simple Wound Assessment / Reassessment - one wound  1 5  - Complex Wound Assessment / Reassessment - multiple wounds 0  - Dermatologic / Skin Assessment (not related to wound area) 0 ASSESSMENTS - Focused Assessment  - Circumferential Edema Measurements - multi extremities 0  - Nutritional Assessment / Counseling / Intervention 0  - Lower Extremity Assessment (monofilament, tuning fork, pulses) 0  - Peripheral Arterial Disease Assessment (using hand held doppler) 0 ASSESSMENTS - Ostomy and/or Continence Assessment and Care  - Incontinence Assessment and Management 0  - Ostomy Care Assessment and Management (repouching, etc.) 0 PROCESS - Coordination of Care X - Simple Patient / Family Education for ongoing care 1 15  - Complex (extensive) Patient / Family Education for ongoing care 0 X - Staff obtains Chiropractor, Records, Test Results / Process Orders 1 10  - Staff telephones HHA, Nursing Homes / Clarify orders / etc 0  - Routine Transfer to another Facility (non-emergent condition) 0 YANKY, VANDERBURG ([id number])  - Routine Hospital Admission (non-emergent condition) 0  - New Admissions / Manufacturing engineer / Ordering NPWT, Apligraf, etc. 0  - Emergency Hospital Admission (emergent condition) 0 X - Simple Discharge Coordination 1 10  - Complex (extensive) Discharge Coordination 0 PROCESS - Special Needs  - Pediatric / Minor Patient Management 0  - Isolation Patient Management 0  - Hearing / Language / Visual special needs 0  - Assessment of Community assistance (transportation, D/C planning, etc.) 0  - Additional assistance / Altered mentation 0  - Support Surface(s) Assessment (bed, cushion, seat, etc.) 0 INTERVENTIONS - Wound Cleansing / Measurement X - Simple Wound Cleansing - one wound 1 5  - Complex Wound Cleansing - multiple wounds 0 X - Wound Imaging (photographs - any number of wounds) 1 5  - Wound Tracing (instead of photographs) 0 X - Simple Wound Measurement - one wound  1 5  - Complex Wound Measurement - multiple wounds 0 INTERVENTIONS - Wound Dressings X - Small  Wound Dressing one or multiple wounds 1 10 []  - Medium Wound Dressing one or multiple wounds 0 []  - Large Wound Dressing one or multiple wounds 0 []  - Application of Medications - topical 0 []  - Application of Medications - injection 0 INTERVENTIONS - Miscellaneous []  - External ear exam 0 Dehnert, Halen E. ([id number]) []  - Specimen Collection (cultures, biopsies, blood, body fluids, etc.) 0 []  - Specimen(s) / Culture(s) sent or taken to Lab for analysis 0 []  - Patient Transfer (multiple staff / Michiel Sites Lift / Similar devices) 0 []  - Simple Staple / Suture removal (25 or less) 0 []  - Complex Staple / Suture removal (26 or more) 0 []  - Hypo / Hyperglycemic Management (close monitor of Blood Glucose) 0 []  - Ankle / Brachial Index (ABI) - do not check if billed separately 0 X - Vital Signs 1 5 Has the patient been seen at the hospital within the last three years: Yes Total Score: 75 Level Of Care: New/Established - Level 2 Electronic Signature(s) Signed: 12/20/2014 5:06:19 PM By: Elliot Gurney, RN, BSN, Kim RN, BSN Entered By: Elliot Gurney, RN, BSN, Kim on 12/20/2014 10:39:58 [name] ([id number]) -------------------------------------------------------------------------------- Encounter Discharge Information Details Patient Name: KAIDEN, PECH. Date of Service: 12/20/2014 10:15 AM Medical Record Number: [id number] Patient Account Number: [id number] Date of Birth/Sex: April 01, 1946 (69 y.o. Male) Treating RN: Huel Coventry Primary Care Physician: Daniel Nones Other Clinician: Referring Physician: Daniel Nones Treating Physician/Extender: Rudene Re in Treatment: 33 Encounter Discharge Information Items Discharge Pain Level: 0 Discharge Condition: Stable Ambulatory Status: Ambulatory Discharge Destination: Home Transportation: Private Auto Accompanied By: self Schedule Follow-up  Appointment: Yes Medication Reconciliation completed and provided to Patient/Care Yes Marisha Renier: Provided on Clinical Summary of Care: 12/20/2014 Form Type Recipient Paper Patient FP Electronic Signature(s) Signed: 12/20/2014 10:43:52 AM By: Gwenlyn Perking Entered By: Gwenlyn Perking on 12/20/2014 10:43:51 [name] ([id number]) -------------------------------------------------------------------------------- General Visit Notes Details Patient Name: [name]. Date of Service: 12/20/2014 10:15 AM Medical Record Number: [id number] Patient Account Number: [id number] Date of Birth/Sex: 08-15-1945 (69 y.o. Male) Treating RN: Huel Coventry Primary Care Physician: Daniel Nones Other Clinician: Referring Physician: Daniel Nones Treating Physician/Extender: Rudene Re in Treatment: 13 Notes Patient came in today with an area that appeared to be scratch marks. Patient does not remember scratching, but most likely did it in his sleep. MD advised to wear socks when he sleeps and compression stocking during the daytime. Patient instructed to use moisturizer on legs. Electronic Signature(s) Signed: 12/20/2014 5:06:19 PM By: Elliot Gurney, RN, BSN, Kim RN, BSN Entered By: Elliot Gurney, RN, BSN, Kim on 12/20/2014 10:51:53 [name] ([id number]) -------------------------------------------------------------------------------- Lower Extremity Assessment Details Patient Name: HEWITT, GARNER. Date of Service: 12/20/2014 10:15 AM Medical Record Number: [id number] Patient Account Number: [id number] Date of Birth/Sex: Aug 25, 1945 (69 y.o. Male) Treating RN: Huel Coventry Primary Care Physician: Daniel Nones Other Clinician: Referring Physician: Daniel Nones Treating Physician/Extender: Rudene Re in Treatment: 23 Edema Assessment Assessed: [Left: No] [Right: No] E[Left: dema] [Right: :] Calf Left: Right: Point of Measurement: 39 cm From Medial Instep cm 36.5 cm Ankle Left:  Right: Point of Measurement: 12 cm From Medial Instep cm 24.5 cm Vascular Assessment Pulses: Posterior Tibial Dorsalis Pedis Palpable: [Right:Yes] Extremity colors, hair growth, and conditions: Extremity Color: [Right:Hyperpigmented] Hair Growth on Extremity: [Right:No] Temperature of Extremity: [Right:Warm] Capillary Refill: [Right:< 3 seconds] Toe Nail Assessment Left: Right: Thick: Yes Discolored: Yes Deformed: Yes Improper Length and Hygiene: No Electronic Signature(s) Signed: 12/20/2014 5:06:19 PM By:  Elliot Gurney, RN, BSN, Radio producer, BSN Entered By: Elliot Gurney, RN, BSN, Kim on 12/20/2014 10:29:48 [name] ([id number]) -------------------------------------------------------------------------------- Multi Wound Chart Details Patient Name: ROLANDO, HESSLING. Date of Service: 12/20/2014 10:15 AM Medical Record Number: [id number] Patient Account Number: [id number] Date of Birth/Sex: January 23, 1946 (69 y.o. 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] [N/A:N/A] Comorbid History: [3:Congestive Heart Failure, Coronary Artery Disease, Hypertension, Type II Diabetes, Gout, Neuropathy] [N/A:N/A] Date Acquired: [3:12/19/2014] [N/A:N/A] Weeks of Treatment: [3:0] [N/A:N/A] Wound Status: [3:Open] [N/A:N/A] Clustered Wound: [3:Yes] [N/A:N/A] Measurements L x W x D 7x2x0.1 [N/A:N/A] (cm) Area (cm) : [3:10.996] [N/A:N/A] Volume (cm) : [3:1.1] [N/A:N/A] % Reduction in Area: [3:0.00%] [N/A:N/A] % Reduction in Volume:  0.00% [N/A:N/A] Classification: [3:Unclassifiable] [N/A:N/A] HBO Classification: [3:Grade 0] [N/A:N/A] Exudate Amount: [3:None Present] [N/A:N/A] Wound Margin: [3:Flat and Intact] [N/A:N/A] Granulation Amount: [3:None Present (0%)] [N/A:N/A] Necrotic Amount: [3:Large (67-100%)] [N/A:N/A] Necrotic Tissue: [3:Eschar] [N/A:N/A] Exposed Structures: [3:Fascia: No Fat: No Tendon: No Muscle: No] [N/A:N/A] Joint: No Bone: No Limited to Skin Breakdown Periwound Skin Texture: Scarring: Yes N/A N/A Edema: No Excoriation: No Induration: No Callus: No Crepitus: No Fluctuance: No Friable: No Rash: No Periwound Skin Dry/Scaly: Yes N/A N/A Moisture: Maceration: No Moist: No Periwound Skin Color: Atrophie Blanche: No N/A N/A Cyanosis: No Ecchymosis: No Erythema: No Hemosiderin Staining: No Mottled: No Pallor: No Rubor: No Tenderness on No N/A N/A Palpation: Wound Preparation: Ulcer Cleansing: N/A N/A Rinsed/Irrigated with Saline Assessment Notes: multiple small scabbed N/A N/A areas Treatment Notes Electronic Signature(s) Signed: 12/20/2014 5:06:19 PM By: Elliot Gurney, RN, BSN, Kim RN, BSN Entered By: Elliot Gurney, RN, BSN, Kim on 12/20/2014 10:33:24 [name] ([id number]) -------------------------------------------------------------------------------- Multi-Disciplinary Care Plan Details Patient Name: JUANANTONIO, STOLAR. Date of Service: 12/20/2014 10:15 AM Medical Record Number: [id number] Patient Account Number: [id number] Date of Birth/Sex: [birth year] (69 y.o. Male) Treating RN: Huel Coventry Primary Care Physician: Daniel Nones Other Clinician: Referring Physician: Daniel Nones Treating Physician/Extender: Rudene Re in Treatment: 71 Active Inactive Electronic Signature(s) Signed: 01/20/2015 6:16:01 PM By: Elliot Gurney, RN, BSN, Kim RN, BSN Previous Signature: 12/20/2014 5:06:19 PM Version By: Elliot Gurney, RN, BSN, Kim RN, BSN Entered By: Elliot Gurney, RN, BSN, Kim on 01/01/2015  15:04:34 [name] ([id number]) -------------------------------------------------------------------------------- Patient/Caregiver Education Details Patient Name: KEANTHONY, POOLE. Date of Service: 12/20/2014 10:15 AM Medical Record Number: [id number] Patient Account Number: [id number] Date of Birth/Gender: 27-Jul-1945 (69 y.o. Male) Treating RN: Huel Coventry Primary Care Physician: Daniel Nones Other Clinician: Referring Physician: Daniel Nones Treating Physician/Extender: Rudene Re in Treatment: 47 Education Assessment Education Provided To: Patient and Caregiver Education Topics Provided Wound/Skin Impairment: Handouts: Skin Care Do's and Dont's, Other: moisturizer Electronic Signature(s) Signed: 12/20/2014 5:06:19 PM By: Elliot Gurney, RN, BSN, Kim RN, BSN Entered By: Elliot Gurney, RN, BSN, Kim on 12/20/2014 10:41:12 [name] ([id number]) -------------------------------------------------------------------------------- Wound Assessment Details Patient Name: MUZAMIL, HARKER. Date of Service: 12/20/2014 10:15 AM Medical Record Number: [id number] Patient Account Number: [id number] Date of Birth/Sex: 10-07-1945 (69 y.o. Male) Treating RN: Huel Coventry Primary Care Physician: Daniel Nones Other Clinician: Referring Physician: Daniel Nones Treating Physician/Extender: Rudene Re in Treatment: 23 Wound Status Wound Number: 3 Primary Venous Leg Ulcer Etiology:  Wound Location: Right Lower Leg Wound Open Wounding Event: Gradually Appeared Status: Date Acquired: 12/19/2014 Comorbid Congestive Heart Failure, Coronary Weeks Of Treatment: 0 History: Artery Disease, Hypertension, Type II Clustered Wound: Yes Diabetes, Gout, Neuropathy Photos Photo Uploaded By: Elliot Gurney, RN, BSN, Kim on 12/20/2014 10:48:28 Wound Measurements Length: (cm) 0 % Reduction i Width: (cm) 0 % Reduction i Depth: (cm) 0 Area: (cm) 0 Volume: (cm) 0 n Area: 100% n Volume: 100% Wound  Description Classification: Unclassifiable Diabetic Severity [address]): Grade 0 Wound Margin: Flat and Intact Exudate Amount: None Present [place], PROSCH. ([id number]) Wound Bed Granulation Amount: None Present (0%) Exposed Structure Necrotic Amount: Large (67-100%) Fascia Exposed: No Necrotic Quality: Eschar Fat Layer Exposed: No Tendon Exposed: No Muscle Exposed: No Joint Exposed: No Bone Exposed: No Limited to Skin Breakdown Periwound Skin Texture Texture Color No Abnormalities Noted: No No Abnormalities Noted: No Callus: No Atrophie Blanche: No Crepitus: No Cyanosis: No Excoriation: No Ecchymosis: No Fluctuance: No Erythema: No Friable: No Hemosiderin Staining: No Induration: No Mottled: No Localized Edema: No Pallor: No Rash: No Rubor: No Scarring: Yes Moisture No Abnormalities Noted: No Dry / Scaly: Yes Maceration: No Moist: No Wound Preparation Ulcer Cleansing: Rinsed/Irrigated with Saline Assessment Notes multiple small scabbed areas Electronic Signature(s) Signed: 12/20/2014 5:06:19 PM By: Elliot Gurney, RN, BSN, Kim RN, BSN Entered By: Elliot Gurney, RN, BSN, Kim on 12/20/2014 10:40:29 [name] ([id number]) -------------------------------------------------------------------------------- Vitals Details Patient Name: [name]. Date of Service: 12/20/2014 10:15 AM Medical Record Number: [id number] Patient Account Number: [id number] Date of Birth/Sex: February 06, 1946 (69 y.o. Male) Treating RN: Huel Coventry Primary Care Physician: Daniel Nones Other Clinician: Referring Physician: Daniel Nones Treating Physician/Extender: Rudene Re in Treatment: 23 Vital Signs Time Taken: 10:27 Temperature (F): 97.6 Height (in): 72 Pulse (bpm): 57 Weight (lbs): 215 Respiratory Rate (breaths/min): 18 Body Mass Index (BMI): 29.2 Blood Pressure (mmHg): 97/57 Reference Range: 80 - 120 mg / dl Electronic Signature(s) Signed: 12/20/2014 5:06:19 PM By: Elliot Gurney,  RN, BSN, Kim RN, BSN Entered By: Elliot Gurney, RN, BSN, Kim on 12/20/2014 10:27:30

## 2014-12-21 NOTE — Progress Notes (Signed)
YASSIR, ENIS ([id number]) Visit Report for 12/20/2014 Chief Complaint Document Details Patient Name: Nicholas, Leonard. Date of Service: 12/20/2014 10:15 AM Medical Record Number: [id number] Patient Account Number: [id number] Date of Birth/Sex: [birth year] (70 y.o. Male) Treating RN: Huel Coventry Primary Care Physician: Daniel Nones Other Clinician: Referring Physician: Daniel Nones Treating Physician/Extender: Rudene Re in Treatment: 75 Information Obtained from: Patient Chief Complaint Buckley was recently discharged about a month ago and was concerned about a few scratches he had done the right lower extremity and is back to see [place]. Electronic Signature(s) Signed: 12/20/2014 11:15:58 AM By: Evlyn Kanner MD, FACS Entered By: Evlyn Kanner on 12/20/2014 11:15:58 Nicholas Leonard ([id number]) -------------------------------------------------------------------------------- HPI Details Patient Name: Nicholas, Leonard. Date of Service: 12/20/2014 10:15 AM Medical Record Number: [id number] Patient Account Number: [id number] Date of Birth/Sex: 1945-12-13 (69 y.o. Male) Treating RN: Huel Coventry Primary Care Physician: Daniel Nones Other Clinician: Referring Physician: Daniel Nones Treating Physician/Extender: Rudene Re in Treatment: 80 History of Present Illness HPI Description: 69 year old gentleman who is recently discharged from [facility] and was admitted between 3 18-3/22 by his primary care physician Dr. Daniel Nones. He has adult onset diabetes and was recently admitted for acute on chronic left-sided systolic congestive heart failure, acute on chronic renal failure, coronary artery disease, ischemic cardiomyopathy, ventricular fibrillation status post AICD implantation, history of atrial fibrillation on Coumadin therapy, hypothyroidism, history of venous stasis ulcer, pulmonary embolism with deep vein thrombosis on chronic Coumadin therapy,  osteoarthritis and gout. He does not smoke any longer and occasionally has alcohol. as not been compliant with getting his blood sugars checked and does not know the last time he had a hemoglobin A1c done. Does not recall when the last ultrasound of his lower extremities was done but he does say that he has had clots in his legs. 07/30/2014 the patient is back to [place] after a recent admission and discharge from the hospital and he was there between 07/19/2014 and 07/26/2014. Reviewed these notes and his final diagnosis was #1 elevated INR, #2 acute on chronic left-sided systolic congestive heart failure, #3 hypothyroidism, #4 gout, #5 chronic kidney disease stage III, #6 acute renal failure, #7 carotid artery disease, #8 history of pulmonary embolism and DVT, #9 adult onset diabetes mellitus. During the admission he was corrected with giving vitamin K and due to his difficulty with Coumadin he was changed over to Eliquis. The patient also was found to have diffuse edema and there was evidence of ascites on abdominal ultrasound and was placed on a Lasix drip by nephrologist. His just discharge medications were noted by me. they have been using Santyl on his right lower extremity wound and he has been doing this dressing as often as the nurse comes in. he has also developed a ulcer of the left thigh and this has been there for about 2 weeks and has some necrosis of skin over there. 08/23/2014 -- he recently saw the vascular surgeon Dr. Gilda Crease and has had both an arterial and duplex venous study done. Though we do not have the official report yet the patient tells me that his arteries were okay and there was no problem with the blood flow. 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 08/30/2014 -- his vascular surgery has been scheduled for Tuesday, May 24 and he will come back and see [place] if he needs to soon  after that  for a rewrap. Addendum: We have finally received his transcript of his office visit with Dr. Gilda Crease on 08/22/2014 and note that the duplex US of the lower extremity arterial system bilaterally SFA are patent, with diffuse tibial disease with monophasic tibial signals on the right and biphasic signals on the left. Duplex Venous ultrasound of the lower extremity shows normal deep venous system, superficial reflux is present over a short segment of the GSV in the region of the ulcer. However the GSV from the knees to the groin is competent. The recommendations were for the patient to have angiography of the lower extremity with the hope of Nicholas, Leonard. ([id number]) intervention for limb salvage. Once the arterial compromise has been addressed then the venous portion can be more effectively treated. 09/05/2014 - s/p RLE angiogram without intervention 09/03/2014 by Dr Gilda Crease. No new complaints today. No significant pain. No fever or chills. Minimal drainage. 09/13/2014 -- On 09/03/2014 o Procedure(s) Performed: 1. Abdominal aortogram 2. Right lower extremity distal runoff third order catheter placement 3. Additional right third order catheter placement as per the patient the vascular surgeon will see him back in 6 months and no other procedures planned for in the near future. 09/27/2014 -- He is here for his first application of a EpiFix which was approved by his insurance company. 10/10/2014 -- He is for his second application of a Epifix. He is doing fine and has no complaints 10/25/2014 he is here for his third application of Epifix and is otherwise doing fine. 11/12/2014 -- he has again developed a lot of edema and his PCP has put him on 2 diuretics. 11/19/2014 -- he worked with his nephrologist and his edema has gone down remarkably after he put him on the proper diuretics. 12/20/2014 -- he noticed some scratches on the right lower extremity and a little redness around them  and was concerned and hence came to see [place] for an opinion. No fever or any other significant problem and he does not recall how he got this superficial abrasion. Electronic Signature(s) Signed: 12/20/2014 11:16:36 AM By: Evlyn Kanner MD, FACS Entered By: Evlyn Kanner on 12/20/2014 11:16:36 Nicholas Leonard ([id number]) -------------------------------------------------------------------------------- Physical Exam Details Patient Name: Nicholas, Leonard. Date of Service: 12/20/2014 10:15 AM Medical Record Number: [id number] Patient Account Number: [id number] Date of Birth/Sex: [birth year] (69 y.o. Male) Treating RN: Huel Coventry Primary Care Physician: Daniel Nones Other Clinician: Referring Physician: Daniel Nones Treating Physician/Extender: Rudene Re in Treatment: 23 Constitutional . Pulse regular. Respirations normal and unlabored. Afebrile. . Eyes Nonicteric. Reactive to light. Ears, Nose, Mouth, and Throat Lips, teeth, and gums WNL.Marland Kitchen Moist mucosa without lesions . Neck supple and nontender. No palpable supraclavicular or cervical adenopathy. Normal sized without goiter. Respiratory WNL. No retractions.. Cardiovascular Pedal Pulses WNL. he has got minimal pitting edema right lower extremity.. Chest Breasts symmetical and no nipple discharge.. Breast tissue WNL, no masses, lumps, or tenderness.. Lymphatic No adneopathy. No adenopathy. No adenopathy. Musculoskeletal Adexa without tenderness or enlargement.. Digits and nails w/o clubbing, cyanosis, infection, petechiae, ischemia, or inflammatory conditions.. Integumentary (Hair, Skin) No suspicious lesions. No crepitus or fluctuance. No peri-wound warmth or erythema. No masses.Marland Kitchen Psychiatric Judgement and insight Intact.. No evidence of depression, anxiety, or agitation.. Notes The wound on the right lower extremity is still completely healed. He has a few superficial abrasions around that area but there is no cellulitis  or open wound. Electronic Signature(s) Signed: 12/20/2014 11:17:20 AM By: Evlyn Kanner MD, FACS Entered By: Evlyn Kanner  on 12/20/2014 11:17:19 Nicholas, Leonard ([id number]) -------------------------------------------------------------------------------- Physician Orders Details Patient Name: Nicholas, Leonard. Date of Service: 12/20/2014 10:15 AM Medical Record Number: [id number] Patient Account Number: [id number] Date of Birth/Sex: 09/14/1945 (69 y.o. Male) Treating RN: Huel Coventry Primary Care Physician: Daniel Nones Other Clinician: Referring Physician: Daniel Nones Treating Physician/Extender: Rudene Re in Treatment: 79 Verbal / Phone Orders: Yes Clinician: Huel Coventry Read Back and Verified: Yes Diagnosis Coding Discharge From [facility] Services Wound #3 Right Lower Leg o Discharge from Wound Care Center - Consult Electronic Signature(s) Signed: 12/20/2014 4:37:26 PM By: Evlyn Kanner MD, FACS Signed: 12/20/2014 5:06:19 PM By: Elliot Gurney RN, BSN, Kim RN, BSN Entered By: Elliot Gurney, RN, BSN, Kim on 12/20/2014 10:39:26 Nicholas Leonard ([id number]) -------------------------------------------------------------------------------- Problem List Details Patient Name: Nicholas, Leonard. Date of Service: 12/20/2014 10:15 AM Medical Record Number: [id number] Patient Account Number: [id number] Date of Birth/Sex: 1945/08/11 (69 y.o. 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 Date of Service: 12/20/2014 10:15 AM Medical Record Number: [id number] Patient Account Number: [id number] Date of Birth/Sex: 04/25/1945 (69 y.o. Male) Treating RN: Huel Coventry Primary Care Physician: Daniel Nones Other Clinician: Referring Physician: Daniel Nones Treating Physician/Extender: Rudene Re in Treatment: 72 Subjective Chief Complaint Information obtained from Patient Calogero was recently discharged about a month ago and was concerned about a few scratches he had done the right lower extremity and is back to see [place]. History of Present Illness (HPI) 69 year old gentleman who is recently discharged from [facility] and was admitted between 3 18-3/22 by his primary care physician Dr. Daniel Nones. He has adult onset diabetes and was recently admitted for acute on chronic left-sided systolic congestive heart failure, acute on chronic renal failure, coronary artery disease, ischemic cardiomyopathy, ventricular fibrillation status post AICD implantation, history of atrial fibrillation on Coumadin therapy, hypothyroidism, history of venous stasis ulcer, pulmonary embolism with deep vein thrombosis on chronic Coumadin therapy, osteoarthritis and gout. He does not smoke any longer and occasionally has alcohol. as not  been compliant with getting his blood sugars checked and does not know the last time he had a hemoglobin A1c done. Does not recall when the last ultrasound of his lower extremities was done but he does say that he has had clots in his legs. 07/30/2014 the patient is back to [place] after a recent admission and discharge from the hospital and he was there between 07/19/2014 and 07/26/2014. Reviewed these notes and his final diagnosis was #1 elevated INR, #2 acute on chronic left-sided systolic congestive heart failure, #3 hypothyroidism, #4 gout, #5 chronic kidney disease stage III, #6 acute renal failure, #7 carotid  artery disease, #8 history of pulmonary embolism and DVT, #9 adult onset diabetes mellitus. During the admission he was corrected with giving vitamin K and due to his difficulty with Coumadin he was changed over to Eliquis. The patient also was found to have diffuse edema and there was evidence of ascites on abdominal ultrasound and was placed on a Lasix drip by nephrologist. His just discharge medications were noted by me. they have been using Santyl on his right lower extremity wound and he has been doing this dressing as often as the nurse comes in. he has also developed a ulcer of the left thigh and this has been there for about 2 weeks and has some necrosis of skin over there. 08/23/2014 -- he recently saw the vascular surgeon Dr. Gilda Crease and has had both an arterial and duplex venous study done. Though we do not have the official report yet the patient tells me that his arteries were okay and there was no problem with the blood flow. 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 08/30/2014 -- his vascular surgery has been scheduled for Tuesday, May 24 and he will come back and see [place] if he needs to soon after that for a rewrap. Nicholas, Leonard ([id number]) Addendum: We have finally received his  transcript of his office visit with Dr. Gilda Crease on 08/22/2014 and note that the duplex US of the lower extremity arterial system bilaterally SFA are patent, with diffuse tibial disease with monophasic tibial signals on the right and biphasic signals on the left. Duplex Venous ultrasound of the lower extremity shows normal deep venous system, superficial reflux is present over a short segment of the GSV in the region of the ulcer. However the GSV from the knees to the groin is competent. The recommendations were for the patient to have angiography of the lower extremity with the hope of intervention for limb salvage. Once the arterial compromise has been addressed then the venous portion can be more effectively treated. 09/05/2014 - s/p RLE angiogram without intervention 09/03/2014 by Dr Gilda Crease. No new complaints today. No significant pain. No fever or chills. Minimal drainage. 09/13/2014 -- On 09/03/2014 Procedure(s) Performed: 1. Abdominal aortogram 2. Right lower extremity distal runoff third order catheter placement 3. Additional right third order catheter placement as per the patient the vascular surgeon will see him back in 6 months and no other procedures planned for in the near future. 09/27/2014 -- He is here for his first application of a EpiFix which was approved by his insurance company. 10/10/2014 -- He is for his second application of a Epifix. He is doing fine and has no complaints 10/25/2014 he is here for his third application of Epifix and is otherwise doing fine. 11/12/2014 -- he has again developed a lot of edema and his PCP has put him on 2 diuretics. 11/19/2014 -- he worked with his nephrologist and his edema has gone down remarkably after he put him on the proper diuretics. 12/20/2014 -- he noticed some scratches on the right lower extremity and a little redness around them and was concerned and hence came to see [place] for an opinion. No fever or any other significant problem  and he does not recall how he got this superficial abrasion. Objective Constitutional Pulse regular. Respirations normal and unlabored. Afebrile. Vitals Time Taken: 10:27 AM, Height: 72 in, Weight: 215 lbs, BMI: 29.2, Temperature: 97.6 F, Pulse: 57 bpm, Respiratory  Rate: 18 breaths/min, Blood Pressure: 97/57 mmHg. Eyes Nonicteric. Reactive to light. Ears, Nose, Mouth, and Throat Lips, teeth, and gums WNL.Marland Kitchen Moist mucosa without lesions . Nicholas, Leonard ([id number]) Neck supple and nontender. No palpable supraclavicular or cervical adenopathy. Normal sized without goiter. Respiratory WNL. No retractions.. Cardiovascular Pedal Pulses WNL. he has got minimal pitting edema right lower extremity.. Chest Breasts symmetical and no nipple discharge.. Breast tissue WNL, no masses, lumps, or tenderness.. Lymphatic No adneopathy. No adenopathy. No adenopathy. Musculoskeletal Adexa without tenderness or enlargement.. Digits and nails w/o clubbing, cyanosis, infection, petechiae, ischemia, or inflammatory conditions.Marland Kitchen Psychiatric Judgement and insight Intact.. No evidence of depression, anxiety, or agitation.. General Notes: The wound on the right lower extremity is still completely healed. He has a few superficial abrasions around that area but there is no cellulitis or open wound. Integumentary (Hair, Skin) No suspicious lesions. No crepitus or fluctuance. No peri-wound warmth or erythema. No masses.. Wound #3 status is Open. Original cause of wound was Gradually Appeared. The wound is located on the Right Lower Leg. The wound measures 0cm length x 0cm width x 0cm depth; 0cm^2 area and 0cm^3 volume. The wound is limited to skin breakdown. There is a none present amount of drainage noted. The wound margin is flat and intact. There is no granulation within the wound bed. There is a large (67-100%) amount of necrotic tissue within the wound bed including Eschar. The periwound skin  appearance exhibited: Scarring, Dry/Scaly. The periwound skin appearance did not exhibit: Callus, Crepitus, Excoriation, Fluctuance, Friable, Induration, Localized Edema, Rash, Maceration, Moist, Atrophie Blanche, Cyanosis, Ecchymosis, Hemosiderin Staining, Mottled, Pallor, Rubor, Erythema. 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 There are no open wounds and no inflammation and hence I have recommended to continue conservative methods of management. I recommended wearing socks at night so that this does not recur. He will return only on an as-needed basis. Plan Discharge From [facility] Services: Wound #3 Right Lower Leg: Discharge from Wound Care Center - Consult I believe he has scratched himself at night as he has rather sharp nails on his feet and this may have caused some superficial abrasions. There are no open wounds and no inflammation and hence I have recommended to continue conservative methods of management. I recommended wearing socks at night so that this does not recur. He will return only on an as-needed basis. Electronic Signature(s) Signed: 12/20/2014 11:18:20 AM By: Evlyn Kanner MD, FACS Entered By: Evlyn Kanner on 12/20/2014 11:18:19 Nicholas Leonard  ([id number]) -------------------------------------------------------------------------------- SuperBill Details Patient Name: Nicholas, Leonard. Date of Service: 12/20/2014 Medical Record Number: [id number] Patient Account Number: [id number] Date of Birth/Sex: 01-12-1946 (69 y.o. 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 L97.122 Non-pressure chronic ulcer of left thigh with fat layer exposed I70.232 Atherosclerosis of native arteries of right leg with ulceration of calf Facility Procedures CPT4 Code: 16109604 Description: 54098 - WOUND CARE VISIT-LEV 2 EST PT Modifier: Quantity: 1 Physician Procedures CPT4 Code: 1191478 Description: 99213 - WC PHYS LEVEL 3 - EST PT ICD-10 Description Diagnosis E11.622 Type 2 diabetes mellitus with other skin ulcer L97.122 Non-pressure chronic ulcer of left thigh with fat lay Modifier: er exposed Quantity: 1 Electronic Signature(s) Signed: 12/20/2014 11:18:55 AM By: Evlyn Kanner MD, FACS Entered By: Evlyn Kanner on 12/20/2014 11:18:55

## 2015-05-14 ENCOUNTER — Encounter: Payer: Self-pay | Admitting: Emergency Medicine

## 2015-05-14 ENCOUNTER — Emergency Department
Admission: EM | Admit: 2015-05-14 | Discharge: 2015-05-14 | Disposition: A | Discharge status: Home / Self Care | Payer: Medicare Other | Attending: Emergency Medicine | Admitting: Emergency Medicine

## 2015-05-14 DIAGNOSIS — E1165 Type 2 diabetes mellitus with hyperglycemia: Secondary | ICD-10-CM | POA: Diagnosis not present

## 2015-05-14 DIAGNOSIS — R739 Hyperglycemia, unspecified: Secondary | ICD-10-CM

## 2015-05-14 DIAGNOSIS — Z87891 Personal history of nicotine dependence: Secondary | ICD-10-CM | POA: Diagnosis not present

## 2015-05-14 LAB — BASIC METABOLIC PANEL
ANION GAP: 12 (ref 5–15)
BUN: 67 mg/dL — ABNORMAL HIGH (ref 6–20)
CO2: 30 mmol/L (ref 22–32)
Calcium: 8.6 mg/dL — ABNORMAL LOW (ref 8.9–10.3)
Chloride: 89 mmol/L — ABNORMAL LOW (ref 101–111)
Creatinine, Ser: 2.47 mg/dL — ABNORMAL HIGH (ref 0.61–1.24)
GFR, EST AFRICAN AMERICAN: 29 mL/min — AB (ref >60)
GFR, EST NON AFRICAN AMERICAN: 25 mL/min — AB (ref >60)
GLUCOSE: 456 mg/dL — AB (ref 65–99)
POTASSIUM: 3 mmol/L — AB (ref 3.5–5.1)
Sodium: 131 mmol/L — ABNORMAL LOW (ref 135–145)

## 2015-05-14 LAB — GLUCOSE, CAPILLARY
Glucose-Capillary: 572 mg/dL (ref 65–99)
Glucose-Capillary: 509 mg/dL — ABNORMAL HIGH (ref 65–99)

## 2015-05-14 LAB — COMPREHENSIVE METABOLIC PANEL
ALT: 25 U/L (ref 17–63)
AST: 28 U/L (ref 15–41)
Albumin: 4.6 g/dL (ref 3.5–5.0)
Alkaline Phosphatase: 108 U/L (ref 38–126)
Anion gap: 14 (ref 5–15)
BUN: 74 mg/dL — ABNORMAL HIGH (ref 6–20)
CHLORIDE: 81 mmol/L — AB (ref 101–111)
CO2: 29 mmol/L (ref 22–32)
Calcium: 9.3 mg/dL (ref 8.9–10.3)
Creatinine, Ser: 2.94 mg/dL — ABNORMAL HIGH (ref 0.61–1.24)
GFR calc non Af Amer: 20 mL/min — ABNORMAL LOW (ref >60)
GFR, EST AFRICAN AMERICAN: 23 mL/min — AB (ref >60)
Glucose, Bld: 569 mg/dL (ref 65–99)
POTASSIUM: 3.2 mmol/L — AB (ref 3.5–5.1)
SODIUM: 124 mmol/L — AB (ref 135–145)
Total Bilirubin: 1.3 mg/dL — ABNORMAL HIGH (ref 0.3–1.2)
Total Protein: 8.7 g/dL — ABNORMAL HIGH (ref 6.5–8.1)

## 2015-05-14 LAB — CBC WITH DIFFERENTIAL/PLATELET
BASOS ABS: 0 10*3/uL (ref 0–0.1)
Basophils Relative: 1 %
EOS PCT: 2 %
Eosinophils Absolute: 0.1 10*3/uL (ref 0–0.7)
HCT: 43.6 % (ref 40.0–52.0)
Hemoglobin: 14.6 g/dL (ref 13.0–18.0)
LYMPHS ABS: 1.4 10*3/uL (ref 1.0–3.6)
Lymphocytes Relative: 22 %
MCH: 30.1 pg (ref 26.0–34.0)
MCHC: 33.5 g/dL (ref 32.0–36.0)
MCV: 90 fL (ref 80.0–100.0)
Monocytes Absolute: 0.5 10*3/uL (ref 0.2–1.0)
Monocytes Relative: 8 %
Neutro Abs: 4.3 10*3/uL (ref 1.4–6.5)
Neutrophils Relative %: 67 %
PLATELETS: 192 10*3/uL (ref 150–440)
RBC: 4.85 MIL/uL (ref 4.40–5.90)
RDW: 16.9 % — ABNORMAL HIGH (ref 11.5–14.5)
WBC: 6.3 10*3/uL (ref 3.8–10.6)

## 2015-05-14 LAB — TROPONIN I: Troponin I: <0.03 ng/mL (ref <0.031)

## 2015-05-14 MED ORDER — INSULIN ASPART 100 UNIT/ML ~~LOC~~ SOLN
5.0000 [IU] | Freq: Once | SUBCUTANEOUS | Status: AC
  Administered 2015-05-14: 5 [IU] via SUBCUTANEOUS
  Filled 2015-05-14: qty 5

## 2015-05-14 MED ORDER — SODIUM CHLORIDE 0.9 % IV SOLN
1000.0000 mL | Freq: Once | INTRAVENOUS | Status: AC
  Administered 2015-05-14: 1000 mL via INTRAVENOUS

## 2015-05-14 MED ORDER — INSULIN ASPART 100 UNIT/ML ~~LOC~~ SOLN
10.0000 [IU] | Freq: Once | SUBCUTANEOUS | Status: AC
  Administered 2015-05-14: 10 [IU] via SUBCUTANEOUS
  Filled 2015-05-14: qty 10

## 2015-05-14 NOTE — ED Notes (Signed)
Reports didn't have is diabetes med filled and didn't realized it bc he "takes so many pills", went to MD for regular checkup yesterday and glucose was 600.

## 2015-05-14 NOTE — Discharge Instructions (Signed)
Hyperglycemia °Hyperglycemia occurs when the glucose (sugar) in your blood is too high. Hyperglycemia can happen for many reasons, but it most often happens to people who do not know they have diabetes or are not managing their diabetes properly.  °CAUSES  °Whether you have diabetes or not, there are other causes of hyperglycemia. Hyperglycemia can occur when you have diabetes, but it can also occur in other situations that you might not be as aware of, such as: °Diabetes °· If you have diabetes and are having problems controlling your blood glucose, hyperglycemia could occur because of some of the following reasons: °¨ Not following your meal plan. °¨ Not taking your diabetes medications or not taking it properly. °¨ Exercising less or doing less activity than you normally do. °¨ Being sick. °Pre-diabetes °· This cannot be ignored. Before people develop Type 2 diabetes, they almost always have "pre-diabetes." This is when your blood glucose levels are higher than normal, but not yet high enough to be diagnosed as diabetes. Research has shown that some long-term damage to the body, especially the heart and circulatory system, may already be occurring during pre-diabetes. If you take action to manage your blood glucose when you have pre-diabetes, you may delay or prevent Type 2 diabetes from developing. °Stress °· If you have diabetes, you may be "diet" controlled or on oral medications or insulin to control your diabetes. However, you may find that your blood glucose is higher than usual in the hospital whether you have diabetes or not. This is often referred to as "stress hyperglycemia." Stress can elevate your blood glucose. This happens because of hormones put out by the body during times of stress. If stress has been the cause of your high blood glucose, it can be followed regularly by your caregiver. That way he/she can make sure your hyperglycemia does not continue to get worse or progress to  diabetes. °Steroids °· Steroids are medications that act on the infection fighting system (immune system) to block inflammation or infection. One side effect can be a rise in blood glucose. Most people can produce enough extra insulin to allow for this rise, but for those who cannot, steroids make blood glucose levels go even higher. It is not unusual for steroid treatments to "uncover" diabetes that is developing. It is not always possible to determine if the hyperglycemia will go away after the steroids are stopped. A special blood test called an A1c is sometimes done to determine if your blood glucose was elevated before the steroids were started. °SYMPTOMS °· Thirsty. °· Frequent urination. °· Dry mouth. °· Blurred vision. °· Tired or fatigue. °· Weakness. °· Sleepy. °· Tingling in feet or leg. °DIAGNOSIS  °Diagnosis is made by monitoring blood glucose in one or all of the following ways: °· A1c test. This is a chemical found in your blood. °· Fingerstick blood glucose monitoring. °· Laboratory results. °TREATMENT  °First, knowing the cause of the hyperglycemia is important before the hyperglycemia can be treated. Treatment may include, but is not be limited to: °· Education. °· Change or adjustment in medications. °· Change or adjustment in meal plan. °· Treatment for an illness, infection, etc. °· More frequent blood glucose monitoring. °· Change in exercise plan. °· Decreasing or stopping steroids. °· Lifestyle changes. °HOME CARE INSTRUCTIONS  °· Test your blood glucose as directed. °· Exercise regularly. Your caregiver will give you instructions about exercise. Pre-diabetes or diabetes which comes on with stress is helped by exercising. °· Eat wholesome,   balanced meals. Eat often and at regular, fixed times. Your caregiver or nutritionist will give you a meal plan to guide your sugar intake. °· Being at an ideal weight is important. If needed, losing as little as 10 to 15 pounds may help improve blood  glucose levels. °SEEK MEDICAL CARE IF:  °· You have questions about medicine, activity, or diet. °· You continue to have symptoms (problems such as increased thirst, urination, or weight gain). °SEEK IMMEDIATE MEDICAL CARE IF:  °· You are vomiting or have diarrhea. °· Your breath smells fruity. °· You are breathing faster or slower. °· You are very sleepy or incoherent. °· You have numbness, tingling, or pain in your feet or hands. °· You have chest pain. °· Your symptoms get worse even though you have been following your caregiver's orders. °· If you have any other questions or concerns. °  °This information is not intended to replace advice given to you by your health care provider. Make sure you discuss any questions you have with your health care provider. °  °Document Released: 09/22/2000 Document Revised: 06/21/2011 Document Reviewed: 12/03/2014 °Elsevier Interactive Patient Education ©2016 [employer] ° °

## 2015-05-14 NOTE — ED Provider Notes (Addendum)
[facility] Emergency Department Provider Note     Time seen: ----------------------------------------- 1:01 PM on 05/14/2015 -----------------------------------------    I have reviewed the triage vital signs and the nursing notes.   HISTORY  Chief Complaint Hyperglycemia    HPI Nicholas Leonard is a 70 y.o. male who presents ER for dizziness and dry mouth last 4-5 days. Patient states he had labs drawn as primary care doctor early called and reports blood sugar was 600. Patient states she doesn't feel different or bad, states he did not realize that he didn't have his glipizide with him. He states he has been out of this medication for several days and it must have fallen out of his back. He has obtained a refill of it today, otherwise denies complaints.   Past Medical History  Diagnosis Date  . Diabetes mellitus without complication (HCC)   . CHF (congestive heart failure) (HCC)     chronic left-sided systolic  . Chronic kidney disease   . Ischemic cardiomyopathy   . Ventricular fibrillation (HCC)   . Atrial fibrillation (HCC)   . Thyroid disease     hypothyroidism  . Venous stasis ulcer (HCC)   . DVT (deep venous thrombosis) (HCC)   . Osteoarthritis   . Gout   . Neuropathy (HCC)   . 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 DVT (deep venous thrombosis) (HCC)   . Myocardial infarction (HCC)   . Shortness of breath dyspnea   . AICD (automatic cardioverter/defibrillator) present   . Decubitus ulcer on ankle   . Hyperthyroidism     There are no active problems to display for this patient.   Past Surgical History  Procedure Laterality Date  . Fistulagram ([facility] hx)  07/02/2014  . Ivc filter    . Insert / replace / remove pacemaker    . Coronary angioplasty    . Peripheral vascular catheterization Right 09/03/2014    Procedure: Lower Extremity Angiography;  Surgeon: Renford Dills, MD;  Location: [facility]  INVASIVE CV LAB;  Service: Cardiovascular;  Laterality: Right;  . Peripheral vascular catheterization Right 09/03/2014    Procedure: Lower Extremity Intervention;  Surgeon: Renford Dills, MD;  Location: [facility] INVASIVE CV LAB;  Service: Cardiovascular;  Laterality: Right;    Allergies Niaspan  Social History Social History  Substance Use Topics  . Smoking status: Former Smoker    Quit date: 07/17/1982  . Smokeless tobacco: None  . Alcohol Use: No    Review of Systems Constitutional: Negative for fever. Eyes: Negative for visual changes. ENT: Negative for sore throat. Cardiovascular: Negative for chest pain. Respiratory: Negative for shortness of breath. Gastrointestinal: Negative for abdominal pain, vomiting and diarrhea. Genitourinary: Negative for dysuria. Musculoskeletal: Negative for back pain. Skin: Negative for rash. Neurological: Negative for headaches, focal weakness or numbness.  10-point ROS otherwise negative.  ____________________________________________   PHYSICAL EXAM:  VITAL SIGNS: ED Triage Vitals  Enc Vitals Group     BP 05/14/15 1104 106/87 mmHg     Pulse Rate 05/14/15 1104 84     Resp 05/14/15 1104 20     Temp 05/14/15 1104 97.4 F (36.3 C)     Temp Source 05/14/15 1104 Oral     SpO2 05/14/15 1104 100 %     Weight 05/14/15 1104 204 lb (92.534 kg)     Height 05/14/15 1104 6' (1.829 m)     Head Cir --      Peak Flow --  Pain Score --      Pain Loc --      Pain Edu? --      Excl. in GC? --     Constitutional: Alert and oriented. Well appearing and in no distress. Eyes: Conjunctivae are normal. PERRL. Normal extraocular movements. ENT   Head: Normocephalic and atraumatic.   Nose: No congestion/rhinnorhea.   Mouth/Throat: Mucous membranes are moist.   Neck: No stridor. Cardiovascular: Normal rate, regular rhythm. Normal and symmetric distal pulses are present in all extremities. No murmurs, rubs, or gallops. Respiratory:  Normal respiratory effort without tachypnea nor retractions. Breath sounds are clear and equal bilaterally. No wheezes/rales/rhonchi. Gastrointestinal: Soft and nontender. No distention. No abdominal bruits.  Musculoskeletal: Nontender with normal range of motion in all extremities. No joint effusions.  No lower extremity tenderness nor edema. Large fluid-filled bursas on both elbows Neurologic:  Normal speech and language. No gross focal neurologic deficits are appreciated. Speech is normal. No gait instability. Skin:  Skin is warm, dry and intact. No rash noted. Psychiatric: Mood and affect are normal. Speech and behavior are normal. Patient exhibits appropriate insight and judgment. ____________________________________________  ED COURSE:  Pertinent labs & imaging results that were available during my care of the patient were reviewed by me and considered in my medical decision making (see chart for details). Patient is in no acute distress, will give fluids, insulin and check basic labs.  EKG: Interpreted by me, normal sinus rhythm with a rate of 60 bpm, first-degree AV block, wide QRS, long QT interval. Right bundle branch block ____________________________________________    LABS (pertinent positives/negatives)  Labs Reviewed  GLUCOSE, CAPILLARY - Abnormal; Notable for the following:    Glucose-Capillary 572 (*)    All other components within normal limits  GLUCOSE, CAPILLARY - Abnormal; Notable for the following:    Glucose-Capillary 509 (*)    All other components within normal limits  CBC WITH DIFFERENTIAL/PLATELET  COMPREHENSIVE METABOLIC PANEL  TROPONIN I  URINALYSIS COMPLETEWITH MICROSCOPIC ([facility] ONLY)  CBG MONITORING, ED   ____________________________________________  FINAL ASSESSMENT AND PLAN  Hyperglycemia  Plan: Patient with labs and imaging as dictated above. Patient is hyperglycemic due to forgetting to take his diabetes medicine. Otherwise patient denies  complaints, we have been noted bring his blood sugar down have restarted his Amaryl. He has received 2 L of saline, subcutaneous insulin and his labs are improving nicely but are not quite back to normal. He is stable for outpatient follow-up with his doctor.   Emily Filbert, MD   Emily Filbert, MD 05/14/15 1303  Emily Filbert, MD 05/14/15 [phone]

## 2015-05-14 NOTE — ED Notes (Signed)
Pt states for the last 4-5 days he has felt dizzy and his mouth has been dry. Pt states had labs drawn at PCP and they called him to report his blood sugar was 600. Pt states he does not feel any different or bad. Pt states has been out of one of his medications but he did not realize it until yesterday. Pt obtained refill for glimepride today.

## 2015-06-03 ENCOUNTER — Inpatient Hospital Stay
Admission: EM | Admit: 2015-06-03 | Discharge: 2015-06-05 | DRG: 637 | Disposition: A | Discharge status: Home Health | Payer: Medicare Other | Attending: Internal Medicine | Admitting: Internal Medicine

## 2015-06-03 ENCOUNTER — Encounter: Payer: Self-pay | Admitting: Emergency Medicine

## 2015-06-03 DIAGNOSIS — E039 Hypothyroidism, unspecified: Secondary | ICD-10-CM | POA: Diagnosis present

## 2015-06-03 DIAGNOSIS — I255 Ischemic cardiomyopathy: Secondary | ICD-10-CM | POA: Diagnosis present

## 2015-06-03 DIAGNOSIS — Z8249 Family history of ischemic heart disease and other diseases of the circulatory system: Secondary | ICD-10-CM | POA: Diagnosis not present

## 2015-06-03 DIAGNOSIS — E785 Hyperlipidemia, unspecified: Secondary | ICD-10-CM | POA: Diagnosis present

## 2015-06-03 DIAGNOSIS — N183 Chronic kidney disease, stage 3 (moderate): Secondary | ICD-10-CM | POA: Diagnosis present

## 2015-06-03 DIAGNOSIS — R739 Hyperglycemia, unspecified: Secondary | ICD-10-CM

## 2015-06-03 DIAGNOSIS — E1122 Type 2 diabetes mellitus with diabetic chronic kidney disease: Secondary | ICD-10-CM | POA: Diagnosis present

## 2015-06-03 DIAGNOSIS — I5022 Chronic systolic (congestive) heart failure: Secondary | ICD-10-CM | POA: Diagnosis present

## 2015-06-03 DIAGNOSIS — Z823 Family history of stroke: Secondary | ICD-10-CM

## 2015-06-03 DIAGNOSIS — Z95810 Presence of automatic (implantable) cardiac defibrillator: Secondary | ICD-10-CM

## 2015-06-03 DIAGNOSIS — E871 Hypo-osmolality and hyponatremia: Secondary | ICD-10-CM | POA: Diagnosis present

## 2015-06-03 DIAGNOSIS — E876 Hypokalemia: Secondary | ICD-10-CM | POA: Diagnosis present

## 2015-06-03 DIAGNOSIS — I129 Hypertensive chronic kidney disease with stage 1 through stage 4 chronic kidney disease, or unspecified chronic kidney disease: Secondary | ICD-10-CM | POA: Diagnosis present

## 2015-06-03 DIAGNOSIS — M1A9XX0 Chronic gout, unspecified, without tophus (tophi): Secondary | ICD-10-CM | POA: Diagnosis present

## 2015-06-03 DIAGNOSIS — I959 Hypotension, unspecified: Secondary | ICD-10-CM | POA: Diagnosis present

## 2015-06-03 DIAGNOSIS — I4891 Unspecified atrial fibrillation: Secondary | ICD-10-CM | POA: Diagnosis present

## 2015-06-03 DIAGNOSIS — Z7984 Long term (current) use of oral hypoglycemic drugs: Secondary | ICD-10-CM | POA: Diagnosis not present

## 2015-06-03 DIAGNOSIS — Z86718 Personal history of other venous thrombosis and embolism: Secondary | ICD-10-CM | POA: Diagnosis not present

## 2015-06-03 DIAGNOSIS — Z87891 Personal history of nicotine dependence: Secondary | ICD-10-CM | POA: Diagnosis not present

## 2015-06-03 DIAGNOSIS — N179 Acute kidney failure, unspecified: Secondary | ICD-10-CM

## 2015-06-03 DIAGNOSIS — N170 Acute kidney failure with tubular necrosis: Secondary | ICD-10-CM | POA: Diagnosis present

## 2015-06-03 DIAGNOSIS — E1100 Type 2 diabetes mellitus with hyperosmolarity without nonketotic hyperglycemic-hyperosmolar coma (NKHHC): Principal | ICD-10-CM | POA: Diagnosis present

## 2015-06-03 DIAGNOSIS — I252 Old myocardial infarction: Secondary | ICD-10-CM | POA: Diagnosis not present

## 2015-06-03 DIAGNOSIS — E860 Dehydration: Secondary | ICD-10-CM | POA: Diagnosis present

## 2015-06-03 DIAGNOSIS — R001 Bradycardia, unspecified: Secondary | ICD-10-CM | POA: Diagnosis present

## 2015-06-03 DIAGNOSIS — N189 Chronic kidney disease, unspecified: Secondary | ICD-10-CM

## 2015-06-03 LAB — TROPONIN I: Troponin I: 0.06 ng/mL — ABNORMAL HIGH (ref <0.031)

## 2015-06-03 LAB — BASIC METABOLIC PANEL
ANION GAP: 22 — AB (ref 5–15)
ANION GAP: 17 — AB (ref 5–15)
BUN: 108 mg/dL — ABNORMAL HIGH (ref 6–20)
BUN: 103 mg/dL — AB (ref 6–20)
CALCIUM: 9.1 mg/dL (ref 8.9–10.3)
CHLORIDE: 70 mmol/L — AB (ref 101–111)
CO2: 28 mmol/L (ref 22–32)
CO2: 29 mmol/L (ref 22–32)
CREATININE: 3.26 mg/dL — AB (ref 0.61–1.24)
Calcium: 9.9 mg/dL (ref 8.9–10.3)
Chloride: 79 mmol/L — ABNORMAL LOW (ref 101–111)
Creatinine, Ser: 3.51 mg/dL — ABNORMAL HIGH (ref 0.61–1.24)
GFR calc Af Amer: 19 mL/min — ABNORMAL LOW (ref >60)
GFR calc Af Amer: 21 mL/min — ABNORMAL LOW (ref >60)
GFR calc non Af Amer: 16 mL/min — ABNORMAL LOW (ref >60)
GFR, EST NON AFRICAN AMERICAN: 18 mL/min — AB (ref >60)
GLUCOSE: 672 mg/dL — AB (ref 65–99)
GLUCOSE: 543 mg/dL — AB (ref 65–99)
POTASSIUM: 2.8 mmol/L — AB (ref 3.5–5.1)
Potassium: 2.4 mmol/L — CL (ref 3.5–5.1)
Sodium: 120 mmol/L — ABNORMAL LOW (ref 135–145)
Sodium: 125 mmol/L — ABNORMAL LOW (ref 135–145)

## 2015-06-03 LAB — GLUCOSE, CAPILLARY
GLUCOSE-CAPILLARY: 565 mg/dL — AB (ref 65–99)
GLUCOSE-CAPILLARY: 546 mg/dL — AB (ref 65–99)
GLUCOSE-CAPILLARY: 361 mg/dL — AB (ref 65–99)
GLUCOSE-CAPILLARY: 152 mg/dL — AB (ref 65–99)
Glucose-Capillary: >600 mg/dL (ref 65–99)
Glucose-Capillary: >600 mg/dL (ref 65–99)
Glucose-Capillary: 277 mg/dL — ABNORMAL HIGH (ref 65–99)

## 2015-06-03 LAB — LACTIC ACID, PLASMA: Lactic Acid, Venous: 2.9 mmol/L (ref 0.5–2.0)

## 2015-06-03 LAB — URINALYSIS COMPLETE WITH MICROSCOPIC (ARMC ONLY)
BACTERIA UA: NONE SEEN
Bilirubin Urine: NEGATIVE
Glucose, UA: >500 mg/dL — AB
HGB URINE DIPSTICK: NEGATIVE
KETONES UR: NEGATIVE mg/dL
LEUKOCYTES UA: NEGATIVE
NITRITE: NEGATIVE
PROTEIN: NEGATIVE mg/dL
SPECIFIC GRAVITY, URINE: 1.006 (ref 1.005–1.030)
Squamous Epithelial / LPF: NONE SEEN
pH: 7 (ref 5.0–8.0)

## 2015-06-03 LAB — CBC WITH DIFFERENTIAL/PLATELET
Basophils Absolute: 0 10*3/uL (ref 0–0.1)
Basophils Relative: 0 %
EOS ABS: 0 10*3/uL (ref 0–0.7)
EOS PCT: 0 %
HCT: 47.8 % (ref 40.0–52.0)
Hemoglobin: 16.5 g/dL (ref 13.0–18.0)
LYMPHS ABS: 1.2 10*3/uL (ref 1.0–3.6)
Lymphocytes Relative: 14 %
MCH: 30.2 pg (ref 26.0–34.0)
MCHC: 34.6 g/dL (ref 32.0–36.0)
MCV: 87.4 fL (ref 80.0–100.0)
MONO ABS: 0.5 10*3/uL (ref 0.2–1.0)
Monocytes Relative: 6 %
Neutro Abs: 6.9 10*3/uL — ABNORMAL HIGH (ref 1.4–6.5)
Neutrophils Relative %: 80 %
PLATELETS: 245 10*3/uL (ref 150–440)
RBC: 5.47 MIL/uL (ref 4.40–5.90)
RDW: 15.3 % — AB (ref 11.5–14.5)
WBC: 8.7 10*3/uL (ref 3.8–10.6)

## 2015-06-03 LAB — MAGNESIUM: Magnesium: 2.5 mg/dL — ABNORMAL HIGH (ref 1.7–2.4)

## 2015-06-03 MED ORDER — MAGNESIUM SULFATE 4 GM/100ML IV SOLN
4.0000 g | Freq: Once | INTRAVENOUS | Status: DC
Start: 2015-06-03 — End: 2015-06-03

## 2015-06-03 MED ORDER — POTASSIUM CHLORIDE CRYS ER 20 MEQ PO TBCR
40.0000 meq | EXTENDED_RELEASE_TABLET | ORAL | Status: DC
  Administered 2015-06-03: 40 meq via ORAL
  Filled 2015-06-03: qty 2

## 2015-06-03 MED ORDER — ONDANSETRON HCL 4 MG/2ML IJ SOLN: 4.0000 mg | Freq: Four times a day (QID) | INTRAMUSCULAR | Status: DC | PRN

## 2015-06-03 MED ORDER — ONDANSETRON HCL 4 MG PO TABS: 4.0000 mg | ORAL_TABLET | Freq: Four times a day (QID) | ORAL | Status: DC | PRN

## 2015-06-03 MED ORDER — SODIUM CHLORIDE 0.9 % IV SOLN: INTRAVENOUS | Status: DC

## 2015-06-03 MED ORDER — ACETAMINOPHEN 325 MG PO TABS: 650.0000 mg | ORAL_TABLET | Freq: Four times a day (QID) | ORAL | Status: DC | PRN

## 2015-06-03 MED ORDER — DEXTROSE-NACL 5-0.45 % IV SOLN
INTRAVENOUS | Status: DC
  Administered 2015-06-04: 01:00:00 via INTRAVENOUS

## 2015-06-03 MED ORDER — HEPARIN SODIUM (PORCINE) 5000 UNIT/ML IJ SOLN
5000.0000 [IU] | Freq: Three times a day (TID) | INTRAMUSCULAR | Status: DC
Start: 2015-06-03 — End: 2015-06-05
  Administered 2015-06-03 – 2015-06-05 (×6): 5000 [IU] via SUBCUTANEOUS
  Filled 2015-06-03 (×6): qty 1

## 2015-06-03 MED ORDER — ALLOPURINOL 100 MG PO TABS
100.0000 mg | ORAL_TABLET | Freq: Every day | ORAL | Status: DC
  Administered 2015-06-04 – 2015-06-05 (×2): 100 mg via ORAL
  Filled 2015-06-03 (×2): qty 1

## 2015-06-03 MED ORDER — SODIUM CHLORIDE 0.9 % IV SOLN
INTRAVENOUS | Status: DC
  Administered 2015-06-03: 5.1 [IU]/h via INTRAVENOUS
  Filled 2015-06-03: qty 2.5

## 2015-06-03 MED ORDER — CALCITRIOL 0.25 MCG PO CAPS
0.2500 ug | ORAL_CAPSULE | Freq: Every day | ORAL | Status: DC
  Administered 2015-06-04 – 2015-06-05 (×2): 0.25 ug via ORAL
  Filled 2015-06-03 (×2): qty 1

## 2015-06-03 MED ORDER — ATORVASTATIN CALCIUM 20 MG PO TABS
80.0000 mg | ORAL_TABLET | Freq: Every day | ORAL | Status: DC
  Administered 2015-06-03 – 2015-06-04 (×2): 80 mg via ORAL
  Filled 2015-06-03 (×2): qty 4

## 2015-06-03 MED ORDER — METOPROLOL SUCCINATE ER 25 MG PO TB24: 25.0000 mg | ORAL_TABLET | Freq: Every day | ORAL | Status: DC

## 2015-06-03 MED ORDER — POTASSIUM CHLORIDE CRYS ER 20 MEQ PO TBCR
20.0000 meq | EXTENDED_RELEASE_TABLET | Freq: Two times a day (BID) | ORAL | Status: DC
  Administered 2015-06-03: 20 meq via ORAL
  Filled 2015-06-03: qty 1

## 2015-06-03 MED ORDER — POLYETHYLENE GLYCOL 3350 17 G PO PACK
17.0000 g | PACK | Freq: Every day | ORAL | Status: DC | PRN
Start: 2015-06-03 — End: 2015-06-05

## 2015-06-03 MED ORDER — BISACODYL 5 MG PO TBEC
5.0000 mg | DELAYED_RELEASE_TABLET | Freq: Every day | ORAL | Status: DC | PRN
  Filled 2015-06-03: qty 1

## 2015-06-03 MED ORDER — SODIUM CHLORIDE 0.9 % IV BOLUS (SEPSIS)
1000.0000 mL | Freq: Once | INTRAVENOUS | Status: AC
  Administered 2015-06-03: 1000 mL via INTRAVENOUS

## 2015-06-03 MED ORDER — ACETAMINOPHEN 650 MG RE SUPP: 650.0000 mg | Freq: Four times a day (QID) | RECTAL | Status: DC | PRN

## 2015-06-03 MED ORDER — SODIUM CHLORIDE 0.9 % IV SOLN
Freq: Once | INTRAVENOUS | Status: AC
  Administered 2015-06-03: 19:00:00 via INTRAVENOUS

## 2015-06-03 MED ORDER — AMIODARONE HCL 200 MG PO TABS: 200.0000 mg | ORAL_TABLET | Freq: Every day | ORAL | Status: DC

## 2015-06-03 MED ORDER — GABAPENTIN 100 MG PO CAPS
100.0000 mg | ORAL_CAPSULE | Freq: Two times a day (BID) | ORAL | Status: DC
  Administered 2015-06-03 – 2015-06-05 (×4): 100 mg via ORAL
  Filled 2015-06-03 (×2): qty 1

## 2015-06-03 MED ORDER — SODIUM CHLORIDE 0.9 % IV SOLN
Freq: Once | INTRAVENOUS | Status: AC
  Administered 2015-06-03: 22:00:00 via INTRAVENOUS

## 2015-06-03 MED ORDER — COLCHICINE 0.6 MG PO TABS
0.6000 mg | ORAL_TABLET | Freq: Every day | ORAL | Status: DC | PRN
  Administered 2015-06-05: 0.6 mg via ORAL
  Filled 2015-06-03: qty 1

## 2015-06-03 MED ORDER — SODIUM CHLORIDE 0.9% FLUSH
3.0000 mL | Freq: Two times a day (BID) | INTRAVENOUS | Status: DC
  Administered 2015-06-04 – 2015-06-05 (×3): 3 mL via INTRAVENOUS

## 2015-06-03 MED ORDER — LEVOTHYROXINE SODIUM 150 MCG PO TABS
150.0000 ug | ORAL_TABLET | Freq: Every day | ORAL | Status: DC
  Administered 2015-06-05: 150 ug via ORAL
  Filled 2015-06-03: qty 1

## 2015-06-03 MED ORDER — POTASSIUM CHLORIDE 10 MEQ/100ML IV SOLN
10.0000 meq | INTRAVENOUS | Status: AC
  Administered 2015-06-03 (×4): 10 meq via INTRAVENOUS
  Filled 2015-06-03 (×4): qty 100

## 2015-06-03 MED ORDER — POTASSIUM CHLORIDE IN NACL 40-0.9 MEQ/L-% IV SOLN
INTRAVENOUS | Status: AC
  Administered 2015-06-03: 125 mL/h via INTRAVENOUS
  Filled 2015-06-03: qty 1000

## 2015-06-03 NOTE — ED Notes (Signed)
Pt to ed with c/o dizziness x 2 weeks,  Pt also reports elevated blood sugars.

## 2015-06-03 NOTE — ED Notes (Signed)
Spoke with Dr. Anne Hahn d/t pt blood pressure 83/62. Orders to administer NS bolus given per MD order.

## 2015-06-03 NOTE — ED Provider Notes (Signed)
[name] Emergency Department Provider Note  ____________________________________________  Time seen: Seen upon arrival to the treatment area. Patient was brought back immediately from triage  I have reviewed the triage vital signs and the nursing notes.   HISTORY  Chief Complaint Dizziness    HPI Nicholas Leonard is a 70 y.o. male with a history of diabetes, chronic kidney disease and atrial fibrillation who is presenting today with dry mouth as well as dizziness when he stands up. He says that he is also been urinating more frequently. He says that he has not missed any doses of his diabetes medications. Denies any pain at this time. Denies any dizziness while laying down. Says his symptoms are only when he tries to stand up.   Past Medical History  Diagnosis Date  . Diabetes mellitus without complication (HCC)   . CHF (congestive heart failure) (HCC)     chronic left-sided systolic  . Chronic kidney disease   . Ischemic cardiomyopathy   . Ventricular fibrillation (HCC)   . Atrial fibrillation (HCC)   . Thyroid disease     hypothyroidism  . Venous stasis ulcer (HCC)   . DVT (deep venous thrombosis) (HCC)   . Osteoarthritis   . Gout   . Neuropathy (HCC)   . 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 DVT (deep venous thrombosis) (HCC)   . Myocardial infarction (HCC)   . Shortness of breath dyspnea   . AICD (automatic cardioverter/defibrillator) present   . Decubitus ulcer on ankle   . Hyperthyroidism     There are no active problems to display for this patient.   Past Surgical History  Procedure Laterality Date  . Fistulagram ([facility] hx)  07/02/2014  . Ivc filter    . Insert / replace / remove pacemaker    . Coronary angioplasty    . Peripheral vascular catheterization Right 09/03/2014    Procedure: Lower Extremity Angiography;  Surgeon: Renford Dills, MD;  Location: [facility] INVASIVE CV LAB;  Service:  Cardiovascular;  Laterality: Right;  . Peripheral vascular catheterization Right 09/03/2014    Procedure: Lower Extremity Intervention;  Surgeon: Renford Dills, MD;  Location: [facility] INVASIVE CV LAB;  Service: Cardiovascular;  Laterality: Right;    Current Outpatient Rx  Name  Route  Sig  Dispense  Refill  . allopurinol (ZYLOPRIM) 100 MG tablet   Oral   Take 100 mg by mouth daily.         Marland Kitchen amiodarone (PACERONE) 200 MG tablet   Oral   Take 200 mg by mouth daily.         Marland Kitchen apixaban (ELIQUIS) 2.5 MG TABS tablet   Oral   Take 2.5 mg by mouth 2 (two) times daily. Reported on 05/14/2015         . aspirin EC 81 MG tablet   Oral   Take 81 mg by mouth daily.         Marland Kitchen atorvastatin (LIPITOR) 80 MG tablet   Oral   Take 80 mg by mouth daily.         . calcitRIOL (ROCALTROL) 0.25 MCG capsule   Oral   Take 1 capsule by mouth daily.         . colchicine (COLCRYS) 0.6 MG tablet   Oral   Take 1 tablet by mouth daily as needed.         . dabigatran (PRADAXA) 75 MG CAPS capsule   Oral  Take 1 capsule by mouth 2 (two) times daily. Reported on 05/14/2015         . ergocalciferol (VITAMIN D2) 50000 UNITS capsule   Oral   Take 50,000 Units by mouth once a week.         . gabapentin (NEURONTIN) 100 MG capsule   Oral   Take 1 capsule by mouth 2 (two) times daily.         Marland Kitchen glimepiride (AMARYL) 1 MG tablet   Oral   Take 1 mg by mouth daily with breakfast.         . glimepiride (AMARYL) 2 MG tablet   Oral   Take 1 tablet by mouth daily.         Marland Kitchen levothyroxine (SYNTHROID, LEVOTHROID) 150 MCG tablet   Oral   Take 150 mcg by mouth daily before breakfast.         . metolazone (ZAROXOLYN) 2.5 MG tablet   Oral   Take 2.5 mg by mouth daily.         . metolazone (ZAROXOLYN) 5 MG tablet   Oral   Take 1 tablet by mouth once a week.         . metoprolol succinate (TOPROL-XL) 25 MG 24 hr tablet   Oral   Take 25 mg by mouth daily.         . potassium  chloride (KLOR-CON) 20 MEQ packet   Oral   Take by mouth 2 (two) times daily.         . potassium chloride SA (K-DUR,KLOR-CON) 20 MEQ tablet   Oral   Take 1 tablet by mouth 2 (two) times daily.         Marland Kitchen torsemide (DEMADEX) 20 MG tablet   Oral   Take 20 mg by mouth 2 (two) times daily.           Allergies Niaspan  Family History  Problem Relation Age of Onset  . Heart disease Mother   . Hypertension Mother   . Heart disease Father   . Hypertension Father   . Heart disease Paternal Grandmother   . Hypertension Paternal Grandmother   . Stroke Paternal Grandmother   . Heart disease Paternal Grandfather   . Hypertension Paternal Grandfather   . Stroke Paternal Grandfather     Social History Social History  Substance Use Topics  . Smoking status: Former Smoker    Quit date: 07/17/1982  . Smokeless tobacco: None  . Alcohol Use: No    Review of Systems Constitutional: No fever/chills Eyes: No visual changes. ENT: No sore throat. Cardiovascular: Denies chest pain. Respiratory: Denies shortness of breath. Gastrointestinal: No abdominal pain.  No nausea, no vomiting.  No diarrhea.  No constipation. Genitourinary: Negative for dysuria. Musculoskeletal: Negative for back pain. Skin: Negative for rash. Neurological: Negative for headaches, focal weakness or numbness.  10-point ROS otherwise negative.  ____________________________________________   PHYSICAL EXAM:  VITAL SIGNS: ED Triage Vitals  Enc Vitals Group     BP 06/03/15 1506 76/56 mmHg     Pulse Rate 06/03/15 1506 78     Resp 06/03/15 1506 20     Temp 06/03/15 1506 98 F (36.7 C)     Temp Source 06/03/15 1506 Oral     SpO2 06/03/15 1506 100 %     Weight 06/03/15 1506 190 lb (86.183 kg)     Height 06/03/15 1506 6' (1.829 m)     Head Cir --      Peak  Flow --      Pain Score 06/03/15 1509 0     Pain Loc --      Pain Edu? --      Excl. in GC? --     Constitutional: Alert and oriented. Well  appearing and in no acute distress. Eyes: Conjunctivae are normal. PERRL. EOMI. Head: Atraumatic. Nose: No congestion/rhinnorhea. Mouth/Throat: Dry membranes are moist.  Oropharynx non-erythematous. Neck: No stridor.   Cardiovascular: Normal rate, regular rhythm. Grossly normal heart sounds.  Good peripheral circulation. Respiratory: Normal respiratory effort.  No retractions. Lungs CTAB. Gastrointestinal: Soft and nontender. No distention. No abdominal bruits. No CVA tenderness. Musculoskeletal: No lower extremity tenderness nor edema.  No joint effusions. Neurologic:  Normal speech and language. No gross focal neurologic deficits are appreciated. Skin:  Skin is warm, dry and intact. No rash noted. Psychiatric: Mood and affect are normal. Speech and behavior are normal.  ____________________________________________   LABS (all labs ordered are listed, but only abnormal results are displayed)  Labs Reviewed  GLUCOSE, CAPILLARY - Abnormal; Notable for the following:    Glucose-Capillary >600 (*)    All other components within normal limits  GLUCOSE, CAPILLARY - Abnormal; Notable for the following:    Glucose-Capillary >600 (*)    All other components within normal limits  CBC WITH DIFFERENTIAL/PLATELET  BLOOD GAS, VENOUS  BASIC METABOLIC PANEL  TROPONIN I  URINALYSIS COMPLETEWITH MICROSCOPIC ([facility] ONLY)   ____________________________________________  EKG  ED ECG REPORT I, Arelia Longest, the attending physician, personally viewed and interpreted this ECG.   Date: 06/03/2015  EKG Time: 1513  Rate: 83  Rhythm: normal sinus rhythm  Intervals:Bifascicular block  ST&T Change: T wave inversions in 3, aVF, V2 through V6. Poor baseline but possible ST elevation in lead 3 with depressions in the septal leads. Repeat EKG. Initially read as STEMI but I'm not convinced because of the poor baseline of this EKG as well as the patient having no chest pain or shortness of  breath.  ED ECG REPORT I, Arelia Longest, the attending physician, personally viewed and interpreted this ECG.   Date: 06/03/2015  EKG Time: 1522  Rate: 80  Rhythm: atrial fibrillation, rate 80  Axis: Normal axis  Intervals:Bifascicular block  ST&T Change: T wave morphology as well as ST segment consistent with bifascicular block. Not consistent with a STEMI. Similar appearance to previous EKGs.   ____________________________________________  RADIOLOGY   ____________________________________________   PROCEDURES CRITICAL CARE Performed by: Arelia Longest   Total critical care time: 35 minutes  Critical care time was exclusive of separately billable procedures and treating other patients.  Critical care was necessary to treat or prevent imminent or life-threatening deterioration.  Critical care was time spent personally by me on the following activities: development of treatment plan with patient and/or surrogate as well as nursing, discussions with consultants, evaluation of patient's response to treatment, examination of patient, obtaining history from patient or surrogate, ordering and performing treatments and interventions, ordering and review of laboratory studies, ordering and review of radiographic studies, pulse oximetry and re-evaluation of patient's condition.  ____________________________________________   INITIAL IMPRESSION / ASSESSMENT AND PLAN / ED COURSE  Pertinent labs & imaging results that were available during my care of the patient were reviewed by me and considered in my medical decision making (see chart for details).  ----------------------------------------- 5:32 PM on 06/03/2015 -----------------------------------------  Patient's blood pressures improved to the 90s. No complaints at this time. Updated patient about  his lab results and the need for admission. The patient was found to have acute on chronic renal failure as well as  hyponatremia, likely, pseudohyponatremia. Also with hypokalemia and an anion gap of 22. Likely from hypochloremia. Patient placed on insulin drip as well as with potassium replacement. Signed out to Dr. Elpidio Anis. ____________________________________________   FINAL CLINICAL IMPRESSION(S) / ED DIAGNOSES  Hypotension. Acute on chronic renal failure. Hyponatremia. Hypo-healing there. Hyperglycemia.    Myrna Blazer, MD 06/03/15 [phone]

## 2015-06-03 NOTE — ED Notes (Signed)
Pt blood pressure ranging from 83/59-88/66, hospitalist notified. Verbal order to administer bolus 0.9 % NS.

## 2015-06-03 NOTE — H&P (Signed)
[facility] - [place] at [facility]   PATIENT NAME: Nicholas Leonard    MR#:  [id number]  DATE OF BIRTH:  01/15/1946  DATE OF ADMISSION:  06/03/2015  PRIMARY CARE PHYSICIAN: Lynnea Ferrier, MD   REQUESTING/REFERRING PHYSICIAN: Dr. Raynelle Chary  CHIEF COMPLAINT:   Chief Complaint  Patient presents with  . Dizziness    HISTORY OF PRESENT ILLNESS:  Nicholas Leonard  is a 70 y.o. male with a known history of diabetes, hypertension, CKD stage III, chronic systolic CHF presents to the emergency room complaining of dizziness, weakness and high blood sugars. Patient has noted his blood sugars with his glucometer mentioning high for 2 weeks. He continues to take glipizide and has tried to improve his diet in spite of his blood sugars have not improved. He had mild nausea but no vomiting or abdominal pain. Decreased urination. Here patient was found to be hypotensive with blood sugars greater than 600. Hyponatremia, hypokalemia and acute renal failure. He does have an ion gap of 22 but PH is normal and bicarbonate is 28. Urine ketones pending.  PAST MEDICAL HISTORY:   Past Medical History  Diagnosis Date  . Diabetes mellitus without complication (HCC)   . CHF (congestive heart failure) (HCC)     chronic left-sided systolic  . Chronic kidney disease   . Ischemic cardiomyopathy   . Ventricular fibrillation (HCC)   . Atrial fibrillation (HCC)   . Thyroid disease     hypothyroidism  . Venous stasis ulcer (HCC)   . DVT (deep venous thrombosis) (HCC)   . Osteoarthritis   . Gout   . Neuropathy (HCC)   . 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 DVT (deep venous thrombosis) (HCC)   . Myocardial infarction (HCC)   . Shortness of breath dyspnea   . AICD (automatic cardioverter/defibrillator) present   . Decubitus ulcer on ankle   . Hyperthyroidism     PAST SURGICAL HISTORY:   Past Surgical History  Procedure Laterality Date  .  Fistulagram ([facility] hx)  07/02/2014  . Ivc filter    . Insert / replace / remove pacemaker    . Coronary angioplasty    . Peripheral vascular catheterization Right 09/03/2014    Procedure: Lower Extremity Angiography;  Surgeon: Renford Dills, MD;  Location: [facility] INVASIVE CV LAB;  Service: Cardiovascular;  Laterality: Right;  . Peripheral vascular catheterization Right 09/03/2014    Procedure: Lower Extremity Intervention;  Surgeon: Renford Dills, MD;  Location: [facility] INVASIVE CV LAB;  Service: Cardiovascular;  Laterality: Right;    SOCIAL HISTORY:   Social History  Substance Use Topics  . Smoking status: Former Smoker    Quit date: 07/17/1982  . Smokeless tobacco: Not on file  . Alcohol Use: No    FAMILY HISTORY:   Family History  Problem Relation Age of Onset  . Heart disease Mother   . Hypertension Mother   . Heart disease Father   . Hypertension Father   . Heart disease Paternal Grandmother   . Hypertension Paternal Grandmother   . Stroke Paternal Grandmother   . Heart disease Paternal Grandfather   . Hypertension Paternal Grandfather   . Stroke Paternal Grandfather     DRUG ALLERGIES:   Allergies  Allergen Reactions  . Niaspan [Niacin Er] Other (See Comments)    Reaction:  Unknown   . Tape Rash    REVIEW OF SYSTEMS:   Review of Systems  Constitutional: Positive for  malaise/fatigue. Negative for fever and chills.  HENT: Negative for sore throat.   Eyes: Negative for blurred vision, double vision and pain.  Respiratory: Negative for cough, hemoptysis, shortness of breath and wheezing.   Cardiovascular: Negative for chest pain, palpitations, orthopnea and leg swelling.  Gastrointestinal: Positive for nausea. Negative for heartburn, vomiting, abdominal pain, diarrhea and constipation.  Genitourinary: Negative for dysuria and hematuria.  Musculoskeletal: Negative for back pain and joint pain.  Skin: Negative for rash.  Neurological: Positive for dizziness  and weakness. Negative for sensory change, speech change, focal weakness and headaches.  Endo/Heme/Allergies: Does not bruise/bleed easily.  Psychiatric/Behavioral: Negative for depression. The patient is not nervous/anxious.     MEDICATIONS AT HOME:   Prior to Admission medications   Medication Sig Start Date End Date Taking? Authorizing Provider  allopurinol (ZYLOPRIM) 100 MG tablet Take 100 mg by mouth daily.   Yes Historical Provider, MD  amiodarone (PACERONE) 200 MG tablet Take 200 mg by mouth daily.   Yes Historical Provider, MD  atorvastatin (LIPITOR) 80 MG tablet Take 80 mg by mouth at bedtime.    Yes Historical Provider, MD  calcitRIOL (ROCALTROL) 0.25 MCG capsule Take 0.25 mcg by mouth daily.    Yes Historical Provider, MD  colchicine (COLCRYS) 0.6 MG tablet Take 0.6 mg by mouth daily as needed (for gout flares).    Yes Historical Provider, MD  gabapentin (NEURONTIN) 100 MG capsule Take 100 mg by mouth 2 (two) times daily.    Yes Historical Provider, MD  glimepiride (AMARYL) 2 MG tablet Take 2 mg by mouth daily with breakfast.    Yes Historical Provider, MD  levothyroxine (SYNTHROID, LEVOTHROID) 150 MCG tablet Take 150 mcg by mouth daily before breakfast.   Yes Historical Provider, MD  metolazone (ZAROXOLYN) 5 MG tablet Take 5 mg by mouth once a week. Pt takes on Monday.   Yes Historical Provider, MD  metoprolol succinate (TOPROL-XL) 25 MG 24 hr tablet Take 25 mg by mouth daily.   Yes Historical Provider, MD  potassium chloride SA (K-DUR,KLOR-CON) 20 MEQ tablet Take 20 mEq by mouth 2 (two) times daily.    Yes Historical Provider, MD  torsemide (DEMADEX) 20 MG tablet Take 40 mg by mouth daily.    Yes Historical Provider, MD     VITAL SIGNS:  Blood pressure 101/77, pulse 53, temperature 98 F (36.7 C), temperature source Oral, resp. rate 23, height 6' (1.829 m), weight 86.183 kg (190 lb), SpO2 100 %.  PHYSICAL EXAMINATION:  Physical Exam  GENERAL:  70 y.o.-year-old patient lying  in the bed with no acute distress.  EYES: Pupils equal, round, reactive to light and accommodation. No scleral icterus. Extraocular muscles intact.  HEENT: Head atraumatic, normocephalic. Oropharynx and nasopharynx clear. No oropharyngeal erythema, moist oral mucosa  NECK:  Supple, no jugular venous distention. No thyroid enlargement, no tenderness.  LUNGS: Normal breath sounds bilaterally, no wheezing, rales, rhonchi. No use of accessory muscles of respiration.  CARDIOVASCULAR: S1, S2 normal. No murmurs, rubs, or gallops.  ABDOMEN: Soft, nontender, nondistended. Bowel sounds present. No organomegaly or mass.  EXTREMITIES: No pedal edema, cyanosis, or clubbing. + 2 pedal & radial pulses b/l.   NEUROLOGIC: Cranial nerves II through XII are intact. No focal Motor or sensory deficits appreciated b/l PSYCHIATRIC: The patient is alert and oriented x 3. Good affect.  SKIN: No obvious rash, lesion, or ulcer.   LABORATORY PANEL:   CBC  Recent Labs Lab 06/03/15 1524  WBC 8.7  HGB 16.5  HCT 47.8  PLT 245   ------------------------------------------------------------------------------------------------------------------  Chemistries   Recent Labs Lab 06/03/15 1524  NA 120*  K 2.8*  CL 70*  CO2 28  GLUCOSE 672*  BUN 108*  CREATININE 3.51*  CALCIUM 9.9   ------------------------------------------------------------------------------------------------------------------  Cardiac Enzymes  Recent Labs Lab 06/03/15 1524  TROPONINI 0.06*   ------------------------------------------------------------------------------------------------------------------  RADIOLOGY:  No results found.   IMPRESSION AND PLAN:   * Hyperglycemia Blood sugars greater than 600. PH and bicarbonate are normal. Ketones are pending at this time. But unlikely DKA with normal bicarbonate and pH. We'll treat with IV insulin drip with Accu-Cheks every hour. Bolus 2 L normal saline. Change to D5 normal saline  was blood sugars less than 250. Check HbA1c. Patient is on glipizide at home 1 mg daily. He will likely need insulin at discharge. Ann L gap is elevated and we'll check lactic acid. Could be due to hypo-chloremia.  * Hyponatremia - likely pseudohyponatremia due to hyperglycemia Should improve with IV fluids and his blood sugars improved.  * Acute renal failure over CKD stage III due to severe dehydration. Monitor on IV fluids. Hold torsemide.  * Chronic systolic congestive heart failure No signs of fluid overload.   * Hypertension Patient was hypotensive in the emergency room. Hold medications  * DVT prophylaxis with heparin  All the records are reviewed and case discussed with ED provider. Management plans discussed with the patient, family and they are in agreement.  CODE STATUS: FULL  TOTAL CC TIME TAKING CARE OF THIS PATIENT: 40 minutes.   Milagros Loll R M.D on 06/03/2015 at 6:05 PM  Between 7am to 6pm - Pager - [phone]  After 6pm go to [email or web address] - password [email or web address] [facility]  [place] [facility]  Office  [phone]  CC: Primary care physician; Lynnea Ferrier, MD  Note: This dictation was prepared with Dragon dictation along with smaller phrase technology. Any transcriptional errors that result from this process are unintentional.

## 2015-06-04 LAB — BASIC METABOLIC PANEL
ANION GAP: 4 — AB (ref 5–15)
Anion gap: 13 (ref 5–15)
Anion gap: 13 (ref 5–15)
BUN: 79 mg/dL — ABNORMAL HIGH (ref 6–20)
BUN: 82 mg/dL — AB (ref 6–20)
BUN: 92 mg/dL — AB (ref 6–20)
CALCIUM: 8.2 mg/dL — AB (ref 8.9–10.3)
CALCIUM: 8.3 mg/dL — AB (ref 8.9–10.3)
CALCIUM: 7.8 mg/dL — AB (ref 8.9–10.3)
CO2: 26 mmol/L (ref 22–32)
CO2: 27 mmol/L (ref 22–32)
CO2: 30 mmol/L (ref 22–32)
CREATININE: 2.42 mg/dL — AB (ref 0.61–1.24)
Chloride: 93 mmol/L — ABNORMAL LOW (ref 101–111)
Chloride: 96 mmol/L — ABNORMAL LOW (ref 101–111)
Chloride: 97 mmol/L — ABNORMAL LOW (ref 101–111)
Creatinine, Ser: 2.85 mg/dL — ABNORMAL HIGH (ref 0.61–1.24)
Creatinine, Ser: 2.52 mg/dL — ABNORMAL HIGH (ref 0.61–1.24)
GFR calc Af Amer: 24 mL/min — ABNORMAL LOW (ref >60)
GFR calc Af Amer: 28 mL/min — ABNORMAL LOW (ref >60)
GFR, EST AFRICAN AMERICAN: 30 mL/min — AB (ref >60)
GFR, EST NON AFRICAN AMERICAN: 21 mL/min — AB (ref >60)
GFR, EST NON AFRICAN AMERICAN: 24 mL/min — AB (ref >60)
GFR, EST NON AFRICAN AMERICAN: 25 mL/min — AB (ref >60)
GLUCOSE: 142 mg/dL — AB (ref 65–99)
GLUCOSE: 181 mg/dL — AB (ref 65–99)
Glucose, Bld: 195 mg/dL — ABNORMAL HIGH (ref 65–99)
POTASSIUM: 2.6 mmol/L — AB (ref 3.5–5.1)
Potassium: 2.6 mmol/L — CL (ref 3.5–5.1)
Potassium: 2.4 mmol/L — CL (ref 3.5–5.1)
SODIUM: 131 mmol/L — AB (ref 135–145)
Sodium: 132 mmol/L — ABNORMAL LOW (ref 135–145)
Sodium: 136 mmol/L (ref 135–145)

## 2015-06-04 LAB — BLOOD GAS, VENOUS
PH VEN: 7.47 — AB (ref 7.320–7.430)
Patient temperature: 37
pCO2, Ven: 47 mmHg (ref 44.0–60.0)

## 2015-06-04 LAB — GLUCOSE, CAPILLARY
GLUCOSE-CAPILLARY: 109 mg/dL — AB (ref 65–99)
GLUCOSE-CAPILLARY: 119 mg/dL — AB (ref 65–99)
GLUCOSE-CAPILLARY: 130 mg/dL — AB (ref 65–99)
GLUCOSE-CAPILLARY: 191 mg/dL — AB (ref 65–99)
GLUCOSE-CAPILLARY: 198 mg/dL — AB (ref 65–99)
GLUCOSE-CAPILLARY: 214 mg/dL — AB (ref 65–99)
Glucose-Capillary: 92 mg/dL (ref 65–99)
Glucose-Capillary: 98 mg/dL (ref 65–99)
Glucose-Capillary: 115 mg/dL — ABNORMAL HIGH (ref 65–99)
Glucose-Capillary: 172 mg/dL — ABNORMAL HIGH (ref 65–99)
Glucose-Capillary: 181 mg/dL — ABNORMAL HIGH (ref 65–99)
Glucose-Capillary: 145 mg/dL — ABNORMAL HIGH (ref 65–99)
Glucose-Capillary: 92 mg/dL (ref 65–99)
Glucose-Capillary: 176 mg/dL — ABNORMAL HIGH (ref 65–99)
Glucose-Capillary: 199 mg/dL — ABNORMAL HIGH (ref 65–99)
Glucose-Capillary: 240 mg/dL — ABNORMAL HIGH (ref 65–99)

## 2015-06-04 LAB — MAGNESIUM: MAGNESIUM: 2.1 mg/dL (ref 1.7–2.4)

## 2015-06-04 LAB — HEMOGLOBIN A1C: HEMOGLOBIN A1C: 13.1 % — AB (ref 4.0–6.0)

## 2015-06-04 LAB — MRSA PCR SCREENING: MRSA BY PCR: NEGATIVE

## 2015-06-04 MED ORDER — AMIODARONE HCL 200 MG PO TABS
100.0000 mg | ORAL_TABLET | Freq: Every day | ORAL | Status: DC
  Administered 2015-06-05: 100 mg via ORAL
  Filled 2015-06-04: qty 1

## 2015-06-04 MED ORDER — INSULIN ASPART 100 UNIT/ML ~~LOC~~ SOLN
0.0000 [IU] | Freq: Three times a day (TID) | SUBCUTANEOUS | Status: DC
  Administered 2015-06-04: 3 [IU] via SUBCUTANEOUS
  Administered 2015-06-05: 8 [IU] via SUBCUTANEOUS
  Filled 2015-06-04: qty 8

## 2015-06-04 MED ORDER — INSULIN GLARGINE 100 UNIT/ML ~~LOC~~ SOLN
40.0000 [IU] | SUBCUTANEOUS | Status: DC
  Administered 2015-06-04 – 2015-06-05 (×2): 40 [IU] via SUBCUTANEOUS
  Filled 2015-06-04 (×3): qty 0.4

## 2015-06-04 MED ORDER — INSULIN STARTER KIT- PEN NEEDLES (ENGLISH)
1.0000 | Freq: Once | Status: DC
  Filled 2015-06-04: qty 1

## 2015-06-04 MED ORDER — SODIUM CHLORIDE 0.9 % IV SOLN
INTRAVENOUS | Status: AC
  Administered 2015-06-04: 13:00:00 via INTRAVENOUS

## 2015-06-04 MED ORDER — INSULIN ASPART 100 UNIT/ML ~~LOC~~ SOLN
0.0000 [IU] | Freq: Every day | SUBCUTANEOUS | Status: DC
  Administered 2015-06-04: 2 [IU] via SUBCUTANEOUS
  Filled 2015-06-04: qty 5

## 2015-06-04 MED ORDER — POTASSIUM CHLORIDE 20 MEQ PO PACK
30.0000 meq | PACK | Freq: Two times a day (BID) | ORAL | Status: DC
  Administered 2015-06-04 – 2015-06-05 (×3): 30 meq via ORAL
  Filled 2015-06-04 (×3): qty 2

## 2015-06-04 NOTE — Progress Notes (Signed)
Pt continue on insulin gtt. K remains low and is being replaced.

## 2015-06-04 NOTE — Care Management (Signed)
Patient present from home with dizziness.  Patient on oral glycemic agents and apparently did not refill his "diabetes medicine.  He is currently on insulin drip and it is very possible he will need to discharge home on insulin.  CM will assess further and the need for home health follow up at discharge

## 2015-06-04 NOTE — Progress Notes (Signed)
eLink Physician-Brief Progress Note Patient Name: Nicholas Leonard DOB: 1945/11/23 MRN: [id number]   Date of Service  06/04/2015  HPI/Events of Note  49 M with DM/HTN/CKD stage III and chronic systolic CHF presenting with dizziness, weakness and hyperglycemia blood sugar in the 600s, ketone negative.  Responding to fluids/IV insulin.  Has received at least one additional fluid bolus for hypotension.  Patient is alert with sats of 96% in no resp distress.  BP is soft but MAP if 66 and HR of 56.  eICU Interventions  Plan of care per primary admitting team. Continue to monitor via [facility]     Intervention Category Evaluation Type: New Patient Evaluation  Nicholas Leonard 06/04/2015, 1:51 AM

## 2015-06-04 NOTE — Progress Notes (Signed)
[facility] - [place] at [facility]   PATIENT NAME: Nicholas Leonard    MR#:  [id number]  DATE OF BIRTH:  06-Nov-1945  SUBJECTIVE: Admitted for DKA, acute renal failure on chronic renal failure, hypokalemia. Patient was feeling weak and dizzy for a week and found to have DKA with anion 22 1 admission. Started on IV hydration with insulin drip. Today he feels much better. Anion gap is closed. Denies abdominal pain or nausea. Patient says that he is been having issues with high blood sugar since January.   CHIEF COMPLAINT:   Chief Complaint  Patient presents with  . Dizziness    REVIEW OF SYSTEMS:    Review of Systems  Constitutional: Negative for fever and chills.  HENT: Negative for hearing loss.   Eyes: Negative for blurred vision, double vision and photophobia.  Respiratory: Negative for cough, hemoptysis and shortness of breath.   Cardiovascular: Negative for palpitations, orthopnea and leg swelling.  Gastrointestinal: Negative for vomiting, abdominal pain and diarrhea.  Genitourinary: Negative for dysuria and urgency.  Musculoskeletal: Negative for myalgias and neck pain.  Skin: Negative for rash.  Neurological: Negative for dizziness, focal weakness, seizures, weakness and headaches.  Psychiatric/Behavioral: Negative for memory loss. The patient does not have insomnia.     Nutrition:  Tolerating Diet: Tolerating PT:      DRUG ALLERGIES:   Allergies  Allergen Reactions  . Niaspan [Niacin Er] Other (See Comments)    Reaction:  Unknown   . Tape Rash    VITALS:  Blood pressure 86/59, pulse 58, temperature 98.1 F (36.7 C), temperature source Oral, resp. rate 14, height 6' (1.829 m), weight 90.7 kg (199 lb 15.3 oz), SpO2 100 %.  PHYSICAL EXAMINATION:   Physical Exam  GENERAL:  70 y.o.-year-old patient lying in the bed with no acute distress.  EYES: Pupils equal, round, reactive to light and accommodation. No scleral icterus. Extraocular  muscles intact.  HEENT: Head atraumatic, normocephalic. Oropharynx and nasopharynx clear.  NECK:  Supple, no jugular venous distention. No thyroid enlargement, no tenderness.  LUNGS: Normal breath sounds bilaterally, no wheezing, rales,rhonchi or crepitation. No use of accessory muscles of respiration.  CARDIOVASCULAR: S1, S2 normal. No murmurs, rubs, or gallops.  ABDOMEN: Soft, nontender, nondistended. Bowel sounds present. No organomegaly or mass.  EXTREMITIES: No pedal edema, cyanosis, or clubbing.  NEUROLOGIC: Cranial nerves II through XII are intact. Muscle strength 5/5 in all extremities. Sensation intact. Gait not checked.  PSYCHIATRIC: The patient is alert and oriented x 3.  SKIN: No obvious rash, lesion, or ulcer.    LABORATORY PANEL:   CBC  Recent Labs Lab 06/03/15 1524  WBC 8.7  HGB 16.5  HCT 47.8  PLT 245   ------------------------------------------------------------------------------------------------------------------  Chemistries   Recent Labs Lab 06/03/15 1933  06/04/15 0814  NA 125*  < > 131*  K 2.4*  < > 2.4*  CL 79*  < > 97*  CO2 29  < > 30  GLUCOSE 543*  < > 195*  BUN 103*  < > 79*  CREATININE 3.26*  < > 2.42*  CALCIUM 9.1  < > 7.8*  MG 2.5*  --   --   < > = values in this interval not displayed. ------------------------------------------------------------------------------------------------------------------  Cardiac Enzymes  Recent Labs Lab 06/03/15 1524  TROPONINI 0.06*   ------------------------------------------------------------------------------------------------------------------  RADIOLOGY:  No results found.   ASSESSMENT AND PLAN:   Active Problems:   Hyperglycemia   1. DKA with hyponatremia hypokalemia and acute on  chronic renal failure: Improved with IV hydration, IV insulin and potassium. We will start the long-acting insulin, mealtime NovoLog, start on ADA diet. #2 poorly controlled diabetes, having issues with blood  sugars. Patient takes Amaryl 2 mg by mouth daily, needs diabetic irrigation, need to add another hypoglycemic agent or start on insulin. #3 hypotension and bradycardia: Hold Zaroxolyn metoprolol torsemide continue amiodarone but decrease the dose. #4 hypokalemia continue to replace potassium. #5 hypothyroidism continue Synthroid. Chronic gout continue colchicine, allopurinol. We will move him to regular room today and likely discharge tomorrow.    All the records are reviewed and case discussed with Care Management/Social Workerr. Management plans discussed with the patient, family and they are in agreement.  CODE STATUS: full  TOTAL TIME TAKING CARE OF THIS PATIENT: 35 minutes.   POSSIBLE D/C IN 1-2DAYS, DEPENDING ON CLINICAL CONDITION.   Katha Hamming M.D on 06/04/2015 at 11:17 AM  Between 7am to 6pm - Pager - [phone]  After 6pm go to [email or web address] - password [email or web address] [facility]  [place] [facility]  Office  [phone]  CC: Primary care physician; Lynnea Ferrier, MD

## 2015-06-04 NOTE — Progress Notes (Addendum)
Inpatient Diabetes Program Recommendations  AACE/ADA: New Consensus Statement on Inpatient Glycemic Control (2015)  Target Ranges:  Prepandial:   less than 140 mg/dL      Peak postprandial:   less than 180 mg/dL (1-2 hours)      Critically ill patients:  140 - 180 mg/dL   Results for Nicholas Leonard, EISEMANN (MRN [id number]) as of 06/04/2015 11:04  Ref. Range 05/14/2015 11:13  Sodium Latest Ref Range: 135-145 mmol/L 124 (L)  Potassium Latest Ref Range: 3.5-5.1 mmol/L 3.2 (L)  Chloride Latest Ref Range: 101-111 mmol/L 81 (L)  CO2 Latest Ref Range: 22-32 mmol/L 29  BUN Latest Ref Range: 6-20 mg/dL 74 (H)  Creatinine Latest Ref Range: 0.61-1.24 mg/dL 2.94 (H)  Calcium Latest Ref Range: 8.9-10.3 mg/dL 9.3  EGFR (Non-African Amer.) Latest Ref Range: >60 mL/min 20 (L)  EGFR (African American) Latest Ref Range: >60 mL/min 23 (L)  Glucose Latest Ref Range: 65-99 mg/dL 569 (HH)  Anion gap Latest Ref Range: 5-15  14    Admit with: Hyperglycemia  History: DM, HTN, CKD, CHF  Home DM Meds: Amaryl 2 mg daily  Current Insulin Orders: Lantus 40 units QAM       Novolog Moderate Correction Scale/ SSI (0-15 units) TID AC + HS     -Note patient to transition off IV insulin drip this AM to Lantus and Novolog.  -Current A1c pending.  -Patient may require insulin for home given admitting glucose was 569 mg/dl.  Only taking Amaryl (glimepiride) daily at home.    Spoke with patient about the possibility of starting insulin at home.  Patient stated he really does not want to take insulin at home but will if needed.  Discussed the two different insulins we are using here in the hospital with patient.  Patient seemed very concerned about the complicated nature of taking more than one shot of insulin per day, the complicated nature of using two different types of insulin, and the cost associated with insulin.  Have asked care management to look into comparing the cost of vial and syringe versus insulin  pens.  Educated patient on insulin pen use at home.  Reviewed all steps of insulin pen including attachment of needle, 2-unit air shot, dialing up dose, giving injection, removing needle, disposal of sharps, storage of unused insulin, disposal of insulin etc.  Patient able to provide successful return demonstration.  Reviewed troubleshooting with insulin pen.  Also reviewed Signs/Symptoms of Hypoglycemia with patient and how to treat Hypoglycemia at home.  Have asked RNs caring for patient to please allow patient to give all injections here in hospital as much as possible for practice.  MD to give patient Rxs for insulin pens and insulin pen needles.    Will have DM Coordinator follow up with patient tomorrow to reinforce insulin pen teaching.  Have also asked RNs to provide insulin injection teaching with vial and syringe method.    MD- If possible, patient may need simple insulin regimen at time of discharge.  We could try one shot of basal insulin per day + his home dose of Amaryl to help with meal time glucose levels.      --Will follow patient during hospitalization--  Wyn Quaker RN, MSN, CDE Diabetes Coordinator Inpatient Glycemic Control Team Team Pager: [phone] (8a-5p)

## 2015-06-04 NOTE — Progress Notes (Signed)
Pt with decreased BP, pt verbalized understanding of possibly having to use SQ insulin at home upon d/c, nursing will cont to monitor

## 2015-06-05 LAB — BASIC METABOLIC PANEL
Anion gap: 10 (ref 5–15)
BUN: 57 mg/dL — ABNORMAL HIGH (ref 6–20)
CALCIUM: 8.6 mg/dL — AB (ref 8.9–10.3)
CO2: 27 mmol/L (ref 22–32)
CREATININE: 1.91 mg/dL — AB (ref 0.61–1.24)
Chloride: 98 mmol/L — ABNORMAL LOW (ref 101–111)
GFR calc Af Amer: 39 mL/min — ABNORMAL LOW (ref >60)
GFR calc non Af Amer: 34 mL/min — ABNORMAL LOW (ref >60)
GLUCOSE: 242 mg/dL — AB (ref 65–99)
Potassium: 3.1 mmol/L — ABNORMAL LOW (ref 3.5–5.1)
Sodium: 135 mmol/L (ref 135–145)

## 2015-06-05 LAB — GLUCOSE, CAPILLARY
Glucose-Capillary: 103 mg/dL — ABNORMAL HIGH (ref 65–99)
Glucose-Capillary: 267 mg/dL — ABNORMAL HIGH (ref 65–99)

## 2015-06-05 MED ORDER — INSULIN GLARGINE 100 UNIT/ML ~~LOC~~ SOLN: 40.0000 [IU] | SUBCUTANEOUS | Status: AC

## 2015-06-05 MED ORDER — METOPROLOL TARTRATE 25 MG PO TABS: 25.0000 mg | ORAL_TABLET | Freq: Two times a day (BID) | ORAL | Status: AC

## 2015-06-05 MED ORDER — INSULIN ASPART 100 UNIT/ML ~~LOC~~ SOLN: 0.0000 [IU] | Freq: Three times a day (TID) | SUBCUTANEOUS | Status: DC

## 2015-06-05 MED ORDER — COLCHICINE 0.6 MG PO TABS: 0.6000 mg | ORAL_TABLET | Freq: Every day | ORAL | Status: DC

## 2015-06-05 NOTE — Progress Notes (Signed)
I provided education to patient regarding self administration of insulin using pre-filled syringe of lantus. Patient was able to read dose as 40 units. I demonstrated to patient how to self inject. Patient cleaned abdomen and self injected insulin after some hesitation. After dosing he said that didn't hurt

## 2015-06-05 NOTE — Progress Notes (Signed)
Chaplain rounded in the unit and offered a compassionate presence and support. Patient and Chaplain dialogued together and had prayer.  Jefm Petty [phone]

## 2015-06-05 NOTE — Care Management (Addendum)
Patient is agreeable to home health nursing to monitor glycemic status, new insulin administration, glucose records, and medication complainace.  Agency preference is [facility].  Confirmed PCP and is current with visits, contact information and phone number.  Spoke with customer service representative from [facility] [phone]) in regards to copay for lasntus insulin.  All medicare D subscribers must meet the same yearly out of pocket deductible of 400 dollars so anything filled by a local pharmacy will be priced at retail until the deductible is met.  Per representative- patient has not met his deductible.  Patient is requesting a pen.  A 3cc pen estimated cost would be 75 dollars and a 10 cc vial would be approximately 249.11.  After deductible is met copay cost would be approximately 47 dollars.  Relayed this information to the patient

## 2015-06-05 NOTE — Care Management (Signed)
Informed that patient is upset about the cost of his insulin.  Spoke with patient and readdressed the matter of meeting his deductible. Attending had also sent script for novolog insulin which had not been anticipated. Dr Luberta Mutter is going to discontinue the order for novolog.  Explained cost of vial (132.00) and cost of 37 day supply of pens (432).  Patient very ambivalent about paying for the meds and says he understands deductible but then becomes argumentative when informed that if he wants pens- box can not be broken- must purchase all but does not want to pay 432.  Per patient instructions- informed pharmacist that patient will take the 10 cc vials.  Again explained that not getting the novolog-  changes the distribution of his deductible. Family member Dondra Spry came to room and stated- "he wants to pay the 432 for the pens."  Patient said "no."  Informed patient and his family member that if they change their minds to inform the endocrinologist at follow up .  Patient is to call office for appointment and verbalizes understanding.  have made a second request for home health order/face to face.

## 2015-06-05 NOTE — Progress Notes (Signed)
Inpatient Diabetes Program Recommendations  AACE/ADA: New Consensus Statement on Inpatient Glycemic Control (2015)  Target Ranges:  Prepandial:   less than 140 mg/dL      Peak postprandial:   less than 180 mg/dL (1-2 hours)      Critically ill patients:  140 - 180 mg/dL   Review of Glycemic Control  Met with patient to review insulin pen use- he was able to demonstrate the use of the insulin pen with little coaching- had to be reminded to place insulin pen needle on the pen before discarding 2 units air shot- practiced a few times with success.  He successfully gave himself his insulin this morning- tells me it didn't hurt "it was just the anticipation".  Spoke with Malachy Mood, RN caring for the patient today- she will review insulin bottle and syringe with him today.   I reviewed the treatment of low blood sugar with the patient and directed him to the insulin teaching kit for pictorial and written insulin instructions when he goes home.   Gentry Fitz, RN, BA, MHA, CDE Diabetes Coordinator Inpatient Diabetes Program  [phone] (Team Pager) [phone] ([place]) 06/05/2015 10:55 AM

## 2015-06-05 NOTE — Progress Notes (Signed)
Patient discharged to home with wife he left the unit in a wheelchair. AVS reviewed with patient and his wife and both state understanding. Patient signed both copies. One given to patient along with insulin kit and the other signed copy placed in chart.

## 2015-06-05 NOTE — Care Management (Signed)
Informed by [facility] agency could not staff case within 24 hours of discharge.  Referral called and accepted by [facility].

## 2015-06-05 NOTE — Progress Notes (Signed)
Inpatient Diabetes Program Recommendations  AACE/ADA: New Consensus Statement on Inpatient Glycemic Control (2015)  Target Ranges:  Prepandial:   less than 140 mg/dL      Peak postprandial:   less than 180 mg/dL (1-2 hours)      Critically ill patients:  140 - 180 mg/dL   Review of Glycemic Control  Results for ENOC, GETTER (MRN [id number]) as of 06/05/2015 09:31  Ref. Range 06/04/2015 12:55 06/04/2015 14:01 06/04/2015 15:06 06/04/2015 21:31 06/05/2015 08:03  Glucose-Capillary Latest Ref Range: 65-99 mg/dL 409 (H) 811 (H) 914 (H) 240 (H) 103 (H)    Admit with: Hyperglycemia  History: DM, HTN, CKD, CHF  Home DM Meds: Amaryl 2 mg daily  Current Insulin Orders: Lantus 40 units QAM,  Novolog Moderate Correction Scale/ SSI (0-15 units) TID AC + HS  Inpatient Diabetes Program Recommendations:  Agree with current insulin orders.  Susette Racer, RN, BA, MHA, CDE Diabetes Coordinator Inpatient Diabetes Program  [phone] (Team Pager) [phone] [facility] Office) 06/05/2015 9:32 AM

## 2015-06-05 NOTE — Progress Notes (Signed)
Patient self injected insulin after it was drawn up by RN

## 2015-06-05 NOTE — Progress Notes (Signed)
[facility] - [place] at [facility]   PATIENT NAME: Nicholas Leonard    MR#:  [id number]  DATE OF BIRTH:  [birth year]  SUBJECTIVE: Admitted for DKA, acute renal failure on chronic renal failure, hypokalemia. Patient is feeling better and denies any complaints.. bg better controlled.stable for discharge today.  CHIEF COMPLAINT:   Chief Complaint  Patient presents with  . Dizziness    REVIEW OF SYSTEMS:    Review of Systems  Constitutional: Negative for fever and chills.  HENT: Negative for hearing loss.   Eyes: Negative for blurred vision, double vision and photophobia.  Respiratory: Negative for cough, hemoptysis and shortness of breath.   Cardiovascular: Negative for palpitations, orthopnea and leg swelling.  Gastrointestinal: Negative for vomiting, abdominal pain and diarrhea.  Genitourinary: Negative for dysuria and urgency.  Musculoskeletal: Negative for myalgias and neck pain.  Skin: Negative for rash.  Neurological: Negative for dizziness, focal weakness, seizures, weakness and headaches.  Psychiatric/Behavioral: Negative for memory loss. The patient does not have insomnia.     Nutrition:  Tolerating Diet: Tolerating PT:      DRUG ALLERGIES:   Allergies  Allergen Reactions  . Niaspan [Niacin Er] Other (See Comments)    Reaction:  Unknown   . Tape Rash    VITALS:  Blood pressure 95/61, pulse 62, temperature 98.3 F (36.8 C), temperature source Oral, resp. rate 20, height 6' (1.829 m), weight 94.6 kg (208 lb 8.9 oz), SpO2 100 %.  PHYSICAL EXAMINATION:   Physical Exam  GENERAL:  70 y.o.-year-old patient lying in the bed with no acute distress.  EYES: Pupils equal, round, reactive to light and accommodation. No scleral icterus. Extraocular muscles intact.  HEENT: Head atraumatic, normocephalic. Oropharynx and nasopharynx clear.  NECK:  Supple, no jugular venous distention. No thyroid enlargement, no tenderness.  LUNGS: Normal breath  sounds bilaterally, no wheezing, rales,rhonchi or crepitation. No use of accessory muscles of respiration.  CARDIOVASCULAR: S1, S2 normal. No murmurs, rubs, or gallops.  ABDOMEN: Soft, nontender, nondistended. Bowel sounds present. No organomegaly or mass.  EXTREMITIES: No pedal edema, cyanosis, or clubbing.  NEUROLOGIC: Cranial nerves II through XII are intact. Muscle strength 5/5 in all extremities. Sensation intact. Gait not checked.  PSYCHIATRIC: The patient is alert and oriented x 3.  SKIN: No obvious rash, lesion, or ulcer.    LABORATORY PANEL:   CBC  Recent Labs Lab 06/03/15 1524  WBC 8.7  HGB 16.5  HCT 47.8  PLT 245   ------------------------------------------------------------------------------------------------------------------  Chemistries   Recent Labs Lab 06/04/15 0814 06/05/15 1044  NA 131* 135  K 2.4* 3.1*  CL 97* 98*  CO2 30 27  GLUCOSE 195* 242*  BUN 79* 57*  CREATININE 2.42* 1.91*  CALCIUM 7.8* 8.6*  MG 2.1  --    ------------------------------------------------------------------------------------------------------------------  Cardiac Enzymes  Recent Labs Lab 06/03/15 1524  TROPONINI 0.06*   ------------------------------------------------------------------------------------------------------------------  RADIOLOGY:  No results found.   ASSESSMENT AND PLAN:   Active Problems:   Hyperglycemia   1. DKA with hyponatremia hypokalemia and acute on chronic renal failure: Improved with IV hydration, IV insulin and potassium.started on lantus and discharged home with LAntus in addition to lantus that he takes,given education about how to  Inject  insulin, #2 poorly controlled diabetes, having issues with blood sugars. Patient takes Amaryl 2 mg by mouth daily, needs diabetic irrigation, need to add another hypoglycemic agent or start on insulin. #3 hypotension;meds  Adjusted .  #4 hypokalemia ;improving,can take k supplements. #5  hypothyroidism continue Synthroid. Chronic gout continue colchicine, allopurinol. Stable for discharge today.  All the records are reviewed and case discussed with Care Management/Social Workerr. Management plans discussed with the patient, family and they are in agreement.  CODE STATUS: full  TOTAL TIME TAKING CARE OF THIS PATIENT: 35 minutes.   POSSIBLE D/C IN 1-2DAYS, DEPENDING ON CLINICAL CONDITION.   Katha Hamming M.D on 06/05/2015 at 11:37 PM  Between 7am to 6pm - Pager - [phone]  After 6pm go to [email or web address] - password [email or web address] [facility]  [facility]  Office  [phone]  CC: Primary care physician; Lynnea Ferrier, MD

## 2015-06-05 NOTE — Progress Notes (Signed)
I called [facility] to see if patient's scripts have been called in and they have. The pharmacist at [facility] asked me to notify patient of the costs of 256.00 for novolog and 132.00 for lantus. Patient says he is not going to pick up medication because he only planned on paying 75.00 like the paperwork says. I read him the paperwork which says he has to meet a 400.00 deductible. I called Nann, RN, CM who spoke with patient and he still insists he only wants to pay 75.00 for a 3 ml pen for 75.00. She called pharmacy and is speaking with patient.

## 2015-06-09 NOTE — Discharge Summary (Signed)
Nicholas Leonard, is a 70 y.o. male  DOB 1945/10/15  MRN [id number].  Admission date:  06/03/2015  Admitting Physician  Nicholas Bow, MD  Discharge Date:  06/04/2015   Primary MD  [place], MD  Recommendations for primary care physician for things to follow:   Follow up with PMD in one week   Admission Diagnosis  Hypokalemia [E87.6] Hyponatremia [E87.1] Hyperglycemia [R73.9] Acute on chronic renal failure (HCC) [N17.9, N18.9]   Discharge Diagnosis  Hypokalemia [E87.6] Hyponatremia [E87.1] Hyperglycemia [R73.9] Acute on chronic renal failure (HCC) [N17.9, N18.9]    Active Problems:   Hyperglycemia      Past Medical History  Diagnosis Date  . Diabetes mellitus without complication (Nicholas Leonard)   . CHF (congestive heart failure) (HCC)     chronic left-sided systolic  . Chronic kidney disease   . Ischemic cardiomyopathy   . Ventricular fibrillation ([place])   . Atrial fibrillation ([place])   . Thyroid disease     hypothyroidism  . Venous stasis ulcer ([place])   . DVT (deep venous thrombosis) ([place])   . Osteoarthritis   . Gout   . Neuropathy ([place])   . 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 DVT (deep venous thrombosis) ([place])   . Myocardial infarction ([place])   . Shortness of breath dyspnea   . AICD (automatic cardioverter/defibrillator) present   . Decubitus ulcer on ankle   . Hyperthyroidism     Past Surgical History  Procedure Laterality Date  . Fistulagram ([facility] hx)  07/02/2014  . Ivc filter    . Insert / replace / remove pacemaker    . Coronary angioplasty    . Peripheral vascular catheterization Right 09/03/2014    Procedure: Lower Extremity Angiography;  Surgeon: Nicholas Cabal, MD;  Location: [place] [facility];  Service: Cardiovascular;  Laterality: Right;  .  Peripheral vascular catheterization Right 09/03/2014    Procedure: Lower Extremity Intervention;  Surgeon: Nicholas Cabal, MD;  Location: [place] [facility];  Service: Cardiovascular;  Laterality: Right;       History of present illness and  Hospital Course:     Kindly see H&P for history of present illness and admission details, please review complete Labs, Consult reports and Test reports for all details in brief  HPI  from the history and physical done on the day of admission 70 yr old  Male admitted for DKA,had dizziness weakness ,high blood glucose of greater than 600.ANion gap 28 .admitted for HONK.  Hospital Course   1.Hyperosmolar non ketotic state;BG more than 600,normal PH and normal bicarb. Admitted to ICU ,started on insulin drp ,IV fluids.hyponatemia is also corrected with IV insulin and  Fluids. Hemoglobin A1 c is 13.seen  by diabetes coordinator.given teaching on insulin injection.discharged home with lantus 40 units daily ,advised him to continue  Amaryl 2 mg po daily with meals that  He already was taking.  reviewed the treatment of low blood sugar with the patient and directed him to the insulin teaching kit for pictorial and written insulin instructions .discharged home with home health RN.  2.Acute on chronic renal failure,CKD stage 3;ARF due to ATN from HONK;improved with IV fluids. 3.Chronic systolic CHF;had hypotension required iv fluids .held his metoprolol and zaroxolyn.bo continued to low but he was asymptomatic.so we asked him to continued to hold  Metoprolol and zaroxolyn till seen by PMD 4.chronic gout;on allopurinol 5,HLP;contnue statins 6.hypokalemia due to HONK;improved after replacement but  Still low ,advised to continue po potassium that he takes  At  Home.   Discharge Condition: stable   Follow UP  Follow-up Information    Follow up with [place] In 1 week.   Contact information:   [facility] called back and said could not  staff your case.  REferral called to [facility] and accepted.  Home visit will be made 2/24  Agency phone number  [phone]        Discharge Instructions  and  Discharge Medications    Discharge Instructions    Face-to-face encounter (required for Medicare/Medicaid patients)    Complete by:  As directed   I Nicholas Leonard certify that this patient is under my care and that I, or a nurse practitioner or physician's assistant working with me, had a face-to-face encounter that meets the physician face-to-face encounter requirements with this patient on 06/05/2015. The encounter with the patient was in whole, or in part for the following medical condition(s) which is the primary reason for home health care  DMII Non compliance htn Chronic gout  The encounter with the patient was in whole, or in part, for the following medical condition, which is the primary reason for home health care:  whole  I certify that, based on my findings, the following services are medically necessary home health services:  Nursing  Reason for Medically Necessary Home Health Services:  Skilled Nursing- Teaching of Disease Process/Symptom Management  My clinical findings support the need for the above services:  Unable to leave home safely without assistance and/or assistive device  Further, I certify that my clinical findings support that this patient is homebound due to:  Unable to leave home safely without assistance     Home Health    Complete by:  As directed   To provide the following care/treatments:  RN            Medication List    STOP taking these medications        metoprolol succinate 25 MG 24 hr tablet  Commonly known as:  TOPROL-XL     torsemide 20 MG tablet  Commonly known as:  DEMADEX      TAKE these medications        allopurinol 100 MG tablet  Commonly known as:  ZYLOPRIM  Take 100 mg by mouth daily.     amiodarone 200 MG tablet  Commonly known as:  PACERONE  Take 200 mg by  mouth daily.     atorvastatin 80 MG tablet  Commonly known as:  LIPITOR  Take 80 mg by mouth at bedtime.     calcitRIOL 0.25 MCG capsule  Commonly known as:  ROCALTROL  Take 0.25 mcg by mouth daily.     COLCRYS 0.6 MG tablet  Generic drug:  colchicine  Take 0.6 mg by mouth daily as needed (for gout flares).     gabapentin 100 MG capsule  Commonly known as:  NEURONTIN  Take 100 mg by mouth 2 (two) times daily.     glimepiride 2 MG tablet  Commonly known as:  AMARYL  Take 2 mg by mouth daily with breakfast.     insulin glargine 100 UNIT/ML injection  Commonly known as:  LANTUS  Inject 0.4 mLs (40 Units total) into the skin daily.     levothyroxine 150 MCG tablet  Commonly known as:  SYNTHROID, LEVOTHROID  Take 150 mcg by mouth daily before breakfast.     metolazone 5 MG tablet  Commonly known as:  ZAROXOLYN  Take 5 mg by mouth once a week. Pt takes on Monday.     metoprolol tartrate 25 MG tablet  Commonly known as:  LOPRESSOR  Take 1 tablet (25 mg total) by mouth 2 (two) times daily.     potassium chloride SA 20 MEQ tablet  Commonly known as:  K-DUR,KLOR-CON  Take 20 mEq by mouth 2 (two) times daily.          Diet and Activity recommendation: See Discharge Instructions above   Consults obtained -diabetic cordinator   Major procedures and Radiology Reports - PLEASE review detailed and final reports for all details, in brief -     No results found.  Micro Results    Recent Results (from the past 240 hour(s))  MRSA PCR Screening     Status: None   Collection Time: 06/03/15 11:07 PM  Result Value Ref Range Status   MRSA by PCR NEGATIVE NEGATIVE Final    Comment:        The GeneXpert MRSA Assay (FDA approved for NASAL specimens only), is one component of a comprehensive MRSA colonization surveillance program. It is not intended to diagnose MRSA infection nor to guide or monitor treatment for MRSA infections.        Today   Subjective:    Nicholas Leonard today has no headache,no chest abdominal pain,no new weakness tingling or numbness, feels much better wants to go home today.   Objective:   Blood pressure 95/61, pulse 62, temperature 98.3 F (36.8 C), temperature source Oral, resp. rate 20, height 6' (1.829 m), weight 94.6 kg (208 lb 8.9 oz), SpO2 100 %.  No intake or output data in the 24 hours ending 06/09/15 0828  Exam Awake Alert, Oriented x 3, No new F.N deficits, Normal affect [place].AT,PERRAL Supple Neck,No JVD, No cervical lymphadenopathy appriciated.  Symmetrical Chest wall movement, Good air movement bilaterally, CTAB RRR,No Gallops,Rubs or new Murmurs, No Parasternal Heave +ve B.Sounds, Abd Soft, Non tender, No organomegaly appriciated, No rebound -guarding or rigidity. No Cyanosis, Clubbing or edema, No new Rash or bruise  Data Review   CBC w Diff:  Lab Results  Component Value Date   WBC 8.7 06/03/2015   WBC 4.8 07/25/2014   HGB 16.5 06/03/2015   HGB 10.8* 07/25/2014   HCT 47.8 06/03/2015   HCT 33.8* 07/25/2014   PLT 245 06/03/2015   PLT 178 07/25/2014   LYMPHOPCT 14 06/03/2015   LYMPHOPCT 17.1 07/25/2014   MONOPCT 6 06/03/2015   MONOPCT 10.5 07/25/2014   MONOPCT 8 07/22/2014   EOSPCT 0 06/03/2015   EOSPCT 1.4 07/25/2014   BASOPCT 0 06/03/2015   BASOPCT 0.6 07/25/2014    CMP:  Lab Results  Component Value Date   NA 135 06/05/2015   NA 131* 07/26/2014   K 3.1* 06/05/2015   K 4.1 07/26/2014   CL 98* 06/05/2015   CL 94* 07/26/2014   CO2 27 06/05/2015   CO2 27 07/26/2014   BUN 57* 06/05/2015   BUN 37* 07/26/2014   CREATININE 1.91* 06/05/2015   CREATININE 1.61* 07/26/2014   PROT 8.7* 05/14/2015   PROT 6.4* 07/25/2014   ALBUMIN 4.6 05/14/2015   ALBUMIN 3.4* 07/25/2014   BILITOT 1.3* 05/14/2015   BILITOT 2.9* 07/25/2014   ALKPHOS 108 05/14/2015   ALKPHOS 114 07/25/2014   AST 28 05/14/2015   AST 75* 07/25/2014   ALT 25 05/14/2015   ALT 89* 07/25/2014  .   Total Time in  preparing paper work, data evaluation and todays exam - 75 minutes  Terrye Dombrosky M.D on 06/04/2015 at 8:28 AM    Note: This dictation was prepared with Dragon dictation along with smaller phrase technology. Any transcriptional errors that result from this process are unintentional.

## 2015-12-26 ENCOUNTER — Encounter (INDEPENDENT_AMBULATORY_CARE_PROVIDER_SITE_OTHER): Payer: Self-pay

## 2015-12-26 DIAGNOSIS — E785 Hyperlipidemia, unspecified: Secondary | ICD-10-CM | POA: Insufficient documentation

## 2015-12-26 DIAGNOSIS — I10 Essential (primary) hypertension: Secondary | ICD-10-CM | POA: Insufficient documentation

## 2015-12-26 DIAGNOSIS — Z8739 Personal history of other diseases of the musculoskeletal system and connective tissue: Secondary | ICD-10-CM | POA: Insufficient documentation

## 2015-12-26 DIAGNOSIS — G4733 Obstructive sleep apnea (adult) (pediatric): Secondary | ICD-10-CM | POA: Insufficient documentation

## 2015-12-26 DIAGNOSIS — I509 Heart failure, unspecified: Secondary | ICD-10-CM | POA: Insufficient documentation

## 2016-01-01 ENCOUNTER — Inpatient Hospital Stay: Payer: Medicare Other

## 2016-01-01 ENCOUNTER — Inpatient Hospital Stay
Admission: EM | Admit: 2016-01-01 | Discharge: 2016-01-11 | DRG: 377 | Disposition: E | Payer: Medicare Other | Attending: Internal Medicine | Admitting: Internal Medicine

## 2016-01-01 ENCOUNTER — Encounter: Payer: Self-pay | Admitting: *Deleted

## 2016-01-01 DIAGNOSIS — I2510 Atherosclerotic heart disease of native coronary artery without angina pectoris: Secondary | ICD-10-CM | POA: Diagnosis present

## 2016-01-01 DIAGNOSIS — Z794 Long term (current) use of insulin: Secondary | ICD-10-CM

## 2016-01-01 DIAGNOSIS — R570 Cardiogenic shock: Secondary | ICD-10-CM | POA: Diagnosis not present

## 2016-01-01 DIAGNOSIS — Z951 Presence of aortocoronary bypass graft: Secondary | ICD-10-CM

## 2016-01-01 DIAGNOSIS — Z9861 Coronary angioplasty status: Secondary | ICD-10-CM | POA: Diagnosis not present

## 2016-01-01 DIAGNOSIS — T45515A Adverse effect of anticoagulants, initial encounter: Secondary | ICD-10-CM | POA: Diagnosis present

## 2016-01-01 DIAGNOSIS — N179 Acute kidney failure, unspecified: Secondary | ICD-10-CM

## 2016-01-01 DIAGNOSIS — D62 Acute posthemorrhagic anemia: Secondary | ICD-10-CM | POA: Diagnosis present

## 2016-01-01 DIAGNOSIS — E1122 Type 2 diabetes mellitus with diabetic chronic kidney disease: Secondary | ICD-10-CM | POA: Diagnosis present

## 2016-01-01 DIAGNOSIS — Z87891 Personal history of nicotine dependence: Secondary | ICD-10-CM

## 2016-01-01 DIAGNOSIS — E1142 Type 2 diabetes mellitus with diabetic polyneuropathy: Secondary | ICD-10-CM | POA: Diagnosis present

## 2016-01-01 DIAGNOSIS — E861 Hypovolemia: Secondary | ICD-10-CM | POA: Diagnosis present

## 2016-01-01 DIAGNOSIS — Z66 Do not resuscitate: Secondary | ICD-10-CM | POA: Diagnosis not present

## 2016-01-01 DIAGNOSIS — I472 Ventricular tachycardia, unspecified: Secondary | ICD-10-CM

## 2016-01-01 DIAGNOSIS — Z91048 Other nonmedicinal substance allergy status: Secondary | ICD-10-CM

## 2016-01-01 DIAGNOSIS — R7989 Other specified abnormal findings of blood chemistry: Secondary | ICD-10-CM | POA: Diagnosis not present

## 2016-01-01 DIAGNOSIS — E039 Hypothyroidism, unspecified: Secondary | ICD-10-CM | POA: Diagnosis present

## 2016-01-01 DIAGNOSIS — E876 Hypokalemia: Secondary | ICD-10-CM | POA: Diagnosis present

## 2016-01-01 DIAGNOSIS — D6832 Hemorrhagic disorder due to extrinsic circulating anticoagulants: Secondary | ICD-10-CM | POA: Diagnosis present

## 2016-01-01 DIAGNOSIS — K922 Gastrointestinal hemorrhage, unspecified: Secondary | ICD-10-CM | POA: Diagnosis not present

## 2016-01-01 DIAGNOSIS — E872 Acidosis: Secondary | ICD-10-CM | POA: Diagnosis not present

## 2016-01-01 DIAGNOSIS — Z7901 Long term (current) use of anticoagulants: Secondary | ICD-10-CM | POA: Diagnosis not present

## 2016-01-01 DIAGNOSIS — I214 Non-ST elevation (NSTEMI) myocardial infarction: Secondary | ICD-10-CM | POA: Diagnosis not present

## 2016-01-01 DIAGNOSIS — Z86711 Personal history of pulmonary embolism: Secondary | ICD-10-CM

## 2016-01-01 DIAGNOSIS — J9690 Respiratory failure, unspecified, unspecified whether with hypoxia or hypercapnia: Secondary | ICD-10-CM

## 2016-01-01 DIAGNOSIS — I130 Hypertensive heart and chronic kidney disease with heart failure and stage 1 through stage 4 chronic kidney disease, or unspecified chronic kidney disease: Secondary | ICD-10-CM | POA: Diagnosis present

## 2016-01-01 DIAGNOSIS — I959 Hypotension, unspecified: Secondary | ICD-10-CM

## 2016-01-01 DIAGNOSIS — K920 Hematemesis: Secondary | ICD-10-CM | POA: Diagnosis present

## 2016-01-01 DIAGNOSIS — Z95810 Presence of automatic (implantable) cardiac defibrillator: Secondary | ICD-10-CM

## 2016-01-01 DIAGNOSIS — E0590 Thyrotoxicosis, unspecified without thyrotoxic crisis or storm: Secondary | ICD-10-CM | POA: Diagnosis present

## 2016-01-01 DIAGNOSIS — J9602 Acute respiratory failure with hypercapnia: Secondary | ICD-10-CM | POA: Diagnosis not present

## 2016-01-01 DIAGNOSIS — Z01818 Encounter for other preprocedural examination: Secondary | ICD-10-CM

## 2016-01-01 DIAGNOSIS — I495 Sick sinus syndrome: Secondary | ICD-10-CM | POA: Diagnosis not present

## 2016-01-01 DIAGNOSIS — Z4659 Encounter for fitting and adjustment of other gastrointestinal appliance and device: Secondary | ICD-10-CM

## 2016-01-01 DIAGNOSIS — Z6827 Body mass index (BMI) 27.0-27.9, adult: Secondary | ICD-10-CM

## 2016-01-01 DIAGNOSIS — N170 Acute kidney failure with tubular necrosis: Secondary | ICD-10-CM | POA: Diagnosis present

## 2016-01-01 DIAGNOSIS — Z515 Encounter for palliative care: Secondary | ICD-10-CM | POA: Diagnosis not present

## 2016-01-01 DIAGNOSIS — I252 Old myocardial infarction: Secondary | ICD-10-CM

## 2016-01-01 DIAGNOSIS — J189 Pneumonia, unspecified organism: Secondary | ICD-10-CM | POA: Diagnosis not present

## 2016-01-01 DIAGNOSIS — R945 Abnormal results of liver function studies: Secondary | ICD-10-CM

## 2016-01-01 DIAGNOSIS — K625 Hemorrhage of anus and rectum: Principal | ICD-10-CM | POA: Diagnosis present

## 2016-01-01 DIAGNOSIS — I4891 Unspecified atrial fibrillation: Secondary | ICD-10-CM | POA: Diagnosis present

## 2016-01-01 DIAGNOSIS — Z8249 Family history of ischemic heart disease and other diseases of the circulatory system: Secondary | ICD-10-CM

## 2016-01-01 DIAGNOSIS — G4733 Obstructive sleep apnea (adult) (pediatric): Secondary | ICD-10-CM | POA: Diagnosis present

## 2016-01-01 DIAGNOSIS — I5023 Acute on chronic systolic (congestive) heart failure: Secondary | ICD-10-CM | POA: Diagnosis not present

## 2016-01-01 DIAGNOSIS — Z888 Allergy status to other drugs, medicaments and biological substances status: Secondary | ICD-10-CM

## 2016-01-01 DIAGNOSIS — I255 Ischemic cardiomyopathy: Secondary | ICD-10-CM | POA: Diagnosis present

## 2016-01-01 DIAGNOSIS — N183 Chronic kidney disease, stage 3 (moderate): Secondary | ICD-10-CM | POA: Diagnosis present

## 2016-01-01 DIAGNOSIS — Z86718 Personal history of other venous thrombosis and embolism: Secondary | ICD-10-CM

## 2016-01-01 DIAGNOSIS — F329 Major depressive disorder, single episode, unspecified: Secondary | ICD-10-CM | POA: Diagnosis present

## 2016-01-01 DIAGNOSIS — M109 Gout, unspecified: Secondary | ICD-10-CM | POA: Diagnosis present

## 2016-01-01 LAB — MAGNESIUM: Magnesium: 2.2 mg/dL (ref 1.7–2.4)

## 2016-01-01 LAB — PROTIME-INR
INR: >11
INR: 1.12
Prothrombin Time: >90 seconds — ABNORMAL HIGH (ref 11.4–15.2)
Prothrombin Time: 14.5 seconds (ref 11.4–15.2)

## 2016-01-01 LAB — CBC
HCT: 34.2 % — ABNORMAL LOW (ref 40.0–52.0)
HEMOGLOBIN: 11.5 g/dL — AB (ref 13.0–18.0)
MCH: 30 pg (ref 26.0–34.0)
MCHC: 33.7 g/dL (ref 32.0–36.0)
MCV: 88.8 fL (ref 80.0–100.0)
PLATELETS: 196 10*3/uL (ref 150–440)
RBC: 3.85 MIL/uL — AB (ref 4.40–5.90)
RDW: 15.4 % — ABNORMAL HIGH (ref 11.5–14.5)
WBC: 23.4 10*3/uL — ABNORMAL HIGH (ref 3.8–10.6)

## 2016-01-01 LAB — PREPARE RBC (CROSSMATCH)

## 2016-01-01 LAB — HEMOGLOBIN AND HEMATOCRIT, BLOOD
HCT: 34.2 % — ABNORMAL LOW (ref 40.0–52.0)
Hemoglobin: 11.9 g/dL — ABNORMAL LOW (ref 13.0–18.0)

## 2016-01-01 LAB — COMPREHENSIVE METABOLIC PANEL
ALBUMIN: 3.9 g/dL (ref 3.5–5.0)
ALK PHOS: 85 U/L (ref 38–126)
ALT: 103 U/L — AB (ref 17–63)
AST: 125 U/L — ABNORMAL HIGH (ref 15–41)
Anion gap: 22 — ABNORMAL HIGH (ref 5–15)
BUN: 157 mg/dL — ABNORMAL HIGH (ref 6–20)
CALCIUM: 9.6 mg/dL (ref 8.9–10.3)
CHLORIDE: 80 mmol/L — AB (ref 101–111)
CO2: 30 mmol/L (ref 22–32)
CREATININE: 5.27 mg/dL — AB (ref 0.61–1.24)
GFR calc non Af Amer: 10 mL/min — ABNORMAL LOW (ref >60)
GFR, EST AFRICAN AMERICAN: 12 mL/min — AB (ref >60)
GLUCOSE: 192 mg/dL — AB (ref 65–99)
Potassium: 2.9 mmol/L — ABNORMAL LOW (ref 3.5–5.1)
SODIUM: 132 mmol/L — AB (ref 135–145)
Total Bilirubin: 1.2 mg/dL (ref 0.3–1.2)
Total Protein: 7.9 g/dL (ref 6.5–8.1)

## 2016-01-01 LAB — GLUCOSE, CAPILLARY
GLUCOSE-CAPILLARY: 179 mg/dL — AB (ref 65–99)
GLUCOSE-CAPILLARY: 204 mg/dL — AB (ref 65–99)
Glucose-Capillary: 161 mg/dL — ABNORMAL HIGH (ref 65–99)

## 2016-01-01 LAB — MRSA PCR SCREENING: MRSA BY PCR: NEGATIVE

## 2016-01-01 LAB — APTT: aPTT: 89 seconds — ABNORMAL HIGH (ref 24–36)

## 2016-01-01 LAB — ABO/RH: ABO/RH(D): A POS

## 2016-01-01 LAB — TROPONIN I
TROPONIN I: 0.16 ng/mL — AB (ref <0.03)
Troponin I: 0.13 ng/mL (ref <0.03)

## 2016-01-01 MED ORDER — ATORVASTATIN CALCIUM 20 MG PO TABS
80.0000 mg | ORAL_TABLET | Freq: Every evening | ORAL | Status: DC
  Administered 2016-01-01 – 2016-01-02 (×2): 80 mg via ORAL
  Filled 2016-01-01 (×2): qty 4

## 2016-01-01 MED ORDER — OCTREOTIDE LOAD VIA INFUSION
50.0000 ug | Freq: Once | INTRAVENOUS | Status: AC
  Administered 2016-01-01: 50 ug via INTRAVENOUS
  Filled 2016-01-01: qty 25

## 2016-01-01 MED ORDER — FUROSEMIDE 10 MG/ML IJ SOLN
20.0000 mg | Freq: Once | INTRAMUSCULAR | Status: AC
  Administered 2016-01-01: 20 mg via INTRAVENOUS

## 2016-01-01 MED ORDER — AMIODARONE HCL 200 MG PO TABS: 200.0000 mg | ORAL_TABLET | ORAL | Status: DC

## 2016-01-01 MED ORDER — SODIUM CHLORIDE 0.9 % IV SOLN
50.0000 ug/h | INTRAVENOUS | Status: DC
  Filled 2016-01-01 (×3): qty 1

## 2016-01-01 MED ORDER — SENNOSIDES-DOCUSATE SODIUM 8.6-50 MG PO TABS: 1.0000 | ORAL_TABLET | Freq: Every evening | ORAL | Status: DC | PRN

## 2016-01-01 MED ORDER — VITAMIN K1 10 MG/ML IJ SOLN
10.0000 mg | INTRAVENOUS | Status: AC
  Administered 2016-01-01: 10 mg via INTRAVENOUS
  Filled 2016-01-01: qty 1

## 2016-01-01 MED ORDER — OCTREOTIDE ACETATE 500 MCG/ML IJ SOLN
50.0000 ug/h | INTRAMUSCULAR | Status: DC
  Administered 2016-01-01 – 2016-01-02 (×4): 50 ug/h via INTRAVENOUS
  Filled 2016-01-01 (×9): qty 1

## 2016-01-01 MED ORDER — ONDANSETRON HCL 4 MG PO TABS: 4.0000 mg | ORAL_TABLET | Freq: Four times a day (QID) | ORAL | Status: DC | PRN

## 2016-01-01 MED ORDER — PANTOPRAZOLE SODIUM 40 MG IV SOLR: 40.0000 mg | Freq: Two times a day (BID) | INTRAVENOUS | Status: DC

## 2016-01-01 MED ORDER — PROTHROMBIN COMPLEX CONC HUMAN 500 UNITS IV KIT
50.0000 [IU]/kg | PACK | Status: DC
  Filled 2016-01-01: qty 191

## 2016-01-01 MED ORDER — SODIUM CHLORIDE 0.9 % IV SOLN: INTRAVENOUS | Status: DC

## 2016-01-01 MED ORDER — PROTHROMBIN COMPLEX CONC HUMAN 500 UNITS IV KIT
4957.0000 [IU] | PACK | Status: AC
  Administered 2016-01-01: 4957 [IU] via INTRAVENOUS
  Filled 2016-01-01: qty 198

## 2016-01-01 MED ORDER — ACETAMINOPHEN 325 MG PO TABS
650.0000 mg | ORAL_TABLET | Freq: Four times a day (QID) | ORAL | Status: DC | PRN
  Administered 2016-01-01 – 2016-01-02 (×2): 650 mg via ORAL
  Filled 2016-01-01 (×2): qty 2

## 2016-01-01 MED ORDER — PROTHROMBIN COMPLEX CONC HUMAN 500 UNITS IV KIT: 50.0000 [IU]/kg | PACK | Status: DC

## 2016-01-01 MED ORDER — POTASSIUM CHLORIDE 10 MEQ/100ML IV SOLN
10.0000 meq | INTRAVENOUS | Status: AC
  Administered 2016-01-01 (×3): 10 meq via INTRAVENOUS
  Filled 2016-01-01 (×9): qty 100

## 2016-01-01 MED ORDER — ALLOPURINOL 100 MG PO TABS
200.0000 mg | ORAL_TABLET | ORAL | Status: DC
  Administered 2016-01-02: 200 mg via ORAL
  Filled 2016-01-01: qty 2

## 2016-01-01 MED ORDER — SODIUM CHLORIDE 0.9 % IV BOLUS (SEPSIS)
1000.0000 mL | Freq: Once | INTRAVENOUS | Status: AC
  Administered 2016-01-01: 1000 mL via INTRAVENOUS

## 2016-01-01 MED ORDER — INSULIN ASPART 100 UNIT/ML ~~LOC~~ SOLN
0.0000 [IU] | Freq: Three times a day (TID) | SUBCUTANEOUS | Status: DC
Start: 2016-01-01 — End: 2016-01-02
  Administered 2016-01-01 – 2016-01-02 (×2): 3 [IU] via SUBCUTANEOUS
  Administered 2016-01-02: 2 [IU] via SUBCUTANEOUS
  Administered 2016-01-02: 3 [IU] via SUBCUTANEOUS
  Filled 2016-01-01 (×2): qty 3
  Filled 2016-01-01: qty 10
  Filled 2016-01-01: qty 3
  Filled 2016-01-01: qty 2

## 2016-01-01 MED ORDER — LEVOTHYROXINE SODIUM 75 MCG PO TABS
150.0000 ug | ORAL_TABLET | Freq: Every day | ORAL | Status: DC
  Administered 2016-01-02: 150 ug via ORAL
  Filled 2016-01-01: qty 2

## 2016-01-01 MED ORDER — PANTOPRAZOLE SODIUM 40 MG IV SOLR
8.0000 mg/h | INTRAVENOUS | Status: DC
  Administered 2016-01-01 – 2016-01-03 (×4): 8 mg/h via INTRAVENOUS
  Filled 2016-01-01 (×5): qty 80

## 2016-01-01 MED ORDER — SODIUM CHLORIDE 0.9 % IV SOLN
INTRAVENOUS | Status: DC
  Administered 2016-01-01: 17:00:00 via INTRAVENOUS

## 2016-01-01 MED ORDER — SODIUM CHLORIDE 0.9 % IV SOLN
10.0000 mL/h | Freq: Once | INTRAVENOUS | Status: AC
  Administered 2016-01-01: 10 mL/h via INTRAVENOUS

## 2016-01-01 MED ORDER — ACETAMINOPHEN 650 MG RE SUPP: 650.0000 mg | Freq: Four times a day (QID) | RECTAL | Status: DC | PRN

## 2016-01-01 MED ORDER — SODIUM CHLORIDE 0.9 % IV SOLN
80.0000 mg | Freq: Once | INTRAVENOUS | Status: AC
  Administered 2016-01-01: 80 mg via INTRAVENOUS
  Filled 2016-01-01 (×3): qty 80

## 2016-01-01 MED ORDER — FUROSEMIDE 10 MG/ML IJ SOLN
INTRAMUSCULAR | Status: AC
  Administered 2016-01-01: 20 mg via INTRAVENOUS
  Filled 2016-01-01: qty 4

## 2016-01-01 MED ORDER — SODIUM CHLORIDE 0.9% FLUSH
3.0000 mL | Freq: Two times a day (BID) | INTRAVENOUS | Status: DC
  Administered 2016-01-02 (×2): 3 mL via INTRAVENOUS

## 2016-01-01 MED ORDER — ONDANSETRON HCL 4 MG/2ML IJ SOLN
4.0000 mg | Freq: Four times a day (QID) | INTRAMUSCULAR | Status: DC | PRN
  Administered 2016-01-01: 4 mg via INTRAVENOUS
  Filled 2016-01-01: qty 2

## 2016-01-01 NOTE — H&P (Signed)
[facility] - [place] [facility]   PATIENT NAME: Nicholas MauFreddie Leonard    MR#:  [id number]  DATE OF BIRTH:  1945/05/16  DATE OF ADMISSION:  2015/07/13  PRIMARY CARE PHYSICIAN: Curtis SitesBERT J KLEIN III, MD   REQUESTING/REFERRING PHYSICIAN: Dr. Cyril LoosenKinner  CHIEF COMPLAINT:    Rectal bleeding and vomiting blood HISTORY OF PRESENT ILLNESS:  Nicholas Leonard  is a 70 y.o. male with a known history of EtOH abuse who quit drinking about 2-1/2 years ago, DVT on Coumadin, ischemic cardiomyopathy ejection fraction of 25% and diabetes who comes from jail with the above complaint. Patient reported to the staff and nursing 5 days ago that he was vomiting blood and had rectal bleeding however he was not brought to the emergency room and was not seen by a physician until today according to the patient. When patient was brought to the emergency room his systolic blood pressure was in the 70s. He received 2 L of normal saline bolus and was transfused 1 unit of PRBCs. Laboratory work came back showing a hemoglobin of 11 and INR of greater than 11. Patient reports that he has not taken Coumadin in 2 weeks.   PAST MEDICAL HISTORY:   Past Medical History:  Diagnosis Date  . AICD (automatic cardioverter/defibrillator) present   . Atrial fibrillation (HCC)   . CHF (congestive heart failure) (HCC)    chronic left-sided systolic  . Chronic kidney disease   . Decubitus ulcer on ankle   . Diabetes mellitus without complication (HCC)   . DVT (deep venous thrombosis) (HCC)   . DVT (deep venous thrombosis) (HCC)   . Gout   . Hyperthyroidism   . Ischemic cardiomyopathy   . Myocardial infarction (HCC)   . Neuropathy (HCC)   . Osteoarthritis   . 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 Shortness of breath dyspnea   . Thyroid disease    hypothyroidism  . Venous stasis ulcer (HCC)   . Ventricular fibrillation (HCC)     PAST SURGICAL HISTORY:   Past Surgical History:   Procedure Laterality Date  . CORONARY ANGIOPLASTY    . FISTULAGRAM ([facility] HX)  07/02/2014  . INSERT / REPLACE / REMOVE PACEMAKER    . IVC filter    . PERIPHERAL VASCULAR CATHETERIZATION Right 09/03/2014   Procedure: Lower Extremity Angiography;  Surgeon: Renford DillsGregory G Schnier, MD;  Location: [facility] INVASIVE CV LAB;  Service: Cardiovascular;  Laterality: Right;  . PERIPHERAL VASCULAR CATHETERIZATION Right 09/03/2014   Procedure: Lower Extremity Intervention;  Surgeon: Renford DillsGregory G Schnier, MD;  Location: [facility] INVASIVE CV LAB;  Service: Cardiovascular;  Laterality: Right;    SOCIAL HISTORY:   Social History  Substance Use Topics  . Smoking status: Former Smoker    Quit date: 07/17/1982  . Smokeless tobacco: Not on file  . Alcohol use No    FAMILY HISTORY:   Family History  Problem Relation Age of Onset  . Heart disease Mother   . Hypertension Mother   . Heart disease Father   . Hypertension Father   . Heart disease Paternal Grandmother   . Hypertension Paternal Grandmother   . Stroke Paternal Grandmother   . Heart disease Paternal Grandfather   . Hypertension Paternal Grandfather   . Stroke Paternal Grandfather     DRUG ALLERGIES:   Allergies  Allergen Reactions  . Niaspan [Niacin Er] Other (See Comments)    Reaction:  Unknown   . Tape Rash    REVIEW OF  SYSTEMS:   Review of Systems  Constitutional: Positive for malaise/fatigue. Negative for chills and fever.  HENT: Negative.  Negative for ear discharge, ear pain, hearing loss, nosebleeds and sore throat.   Eyes: Negative.  Negative for blurred vision and pain.  Respiratory: Negative.  Negative for cough, hemoptysis, shortness of breath and wheezing.   Cardiovascular: Negative.  Negative for chest pain, palpitations and leg swelling.       Dizzy and lightheaded  Gastrointestinal: Positive for blood in stool. Negative for abdominal pain, diarrhea, nausea and vomiting.       Vomiting blood  Genitourinary: Negative.  Negative  for dysuria.  Musculoskeletal: Negative.  Negative for back pain.  Skin: Negative.   Neurological: Positive for weakness. Negative for dizziness, tremors, speech change, focal weakness, seizures and headaches.  Endo/Heme/Allergies: Negative.  Does not bruise/bleed easily.  Psychiatric/Behavioral: Negative.  Negative for depression, hallucinations and suicidal ideas.    MEDICATIONS AT HOME:   Prior to Admission medications   Medication Sig Start Date End Date Taking? Authorizing Provider  allopurinol (ZYLOPRIM) 100 MG tablet Take 100 mg by mouth daily.    Historical Provider, MD  amiodarone (PACERONE) 200 MG tablet Take 200 mg by mouth daily.    Historical Provider, MD  apixaban (ELIQUIS) 2.5 MG TABS tablet Take 2.5 mg by mouth 2 (two) times daily.    Historical Provider, MD  atorvastatin (LIPITOR) 80 MG tablet Take 80 mg by mouth at bedtime.     Historical Provider, MD  calcitRIOL (ROCALTROL) 0.25 MCG capsule Take 0.25 mcg by mouth daily.     Historical Provider, MD  colchicine (COLCRYS) 0.6 MG tablet Take 0.6 mg by mouth daily as needed (for gout flares).     Historical Provider, MD  gabapentin (NEURONTIN) 100 MG capsule Take 100 mg by mouth 2 (two) times daily.     Historical Provider, MD  glimepiride (AMARYL) 2 MG tablet Take 2 mg by mouth daily with breakfast.     Historical Provider, MD  insulin glargine (LANTUS) 100 UNIT/ML injection Inject 0.4 mLs (40 Units total) into the skin daily. 06/05/15   Katha Hamming, MD  levothyroxine (SYNTHROID, LEVOTHROID) 150 MCG tablet Take 150 mcg by mouth daily before breakfast.    Historical Provider, MD  metolazone (ZAROXOLYN) 5 MG tablet Take 5 mg by mouth once a week. Pt takes on Monday.    Historical Provider, MD  metoprolol tartrate (LOPRESSOR) 25 MG tablet Take 1 tablet (25 mg total) by mouth 2 (two) times daily. 06/05/15   Katha Hamming, MD  potassium chloride SA (K-DUR,KLOR-CON) 20 MEQ tablet Take 20 mEq by mouth 2 (two) times daily.      Historical Provider, MD    Coumadin 6 mg daily Allopurinol 200 mg daily Trazodone 50 mg at bedtime  VITAL SIGNS:  Blood pressure (!) 87/60, pulse 85, temperature 98.9 F (37.2 C), temperature source Oral, resp. rate 19, height 6' (1.829 m), weight 95.7 kg (211 lb), SpO2 100 %.  PHYSICAL EXAMINATION:   Physical Exam  Constitutional: He is oriented to person, place, and time and well-developed, well-nourished, and in no distress. No distress.  HENT:  Head: Normocephalic.  Eyes: No scleral icterus.  Neck: Normal range of motion. Neck supple. No JVD present. No tracheal deviation present.  Cardiovascular: Normal rate and regular rhythm.  Exam reveals no gallop and no friction rub.   Murmur heard. Pulmonary/Chest: Effort normal and breath sounds normal. No respiratory distress. He has no wheezes. He has no rales. He  exhibits no tenderness.  Abdominal: Soft. Bowel sounds are normal. He exhibits no distension and no mass. There is no tenderness. There is no rebound and no guarding.  Musculoskeletal: Normal range of motion. He exhibits no edema.  Neurological: He is alert and oriented to person, place, and time.  Skin: Skin is warm. No rash noted. No erythema.  Psychiatric: Affect and judgment normal.      LABORATORY PANEL:   CBC  Recent Labs Lab 01-31-2016 1400  WBC 23.4*  HGB 11.5*  HCT 34.2*  PLT 196   ------------------------------------------------------------------------------------------------------------------  Chemistries   Recent Labs Lab 01/31/2016 1400  NA 132*  K 2.9*  CL 80*  CO2 30  GLUCOSE 192*  BUN 157*  CREATININE 5.27*  CALCIUM 9.6  AST 125*  ALT 103*  ALKPHOS 85  BILITOT 1.2   ------------------------------------------------------------------------------------------------------------------  Cardiac Enzymes  Recent Labs Lab 2016-01-31 1400  TROPONINI 0.16*    ------------------------------------------------------------------------------------------------------------------  RADIOLOGY:  No results found.  EKG:   RBBB no St elevation  IMPRESSION AND PLAN:   70 year old male with ischemic cardiomyopathy EF of 25% by echocardiogram in 2012, diabetes and former EtOH abuse who presents with GI bleed and supratherapeutic INR.  1. GI bleed: Start IV Protonix and octreotide GI consult for EGD  Monitor hemoglobin Transfuse as needed  2. Supratherapeutic INR: Vitamin K and PCC ordered. Recheck INR in a.m.  3. Acute on chronic kidney disease stage III:  Likely due to hypovolemia. Hold nephrotoxic agents. Nephrology consult.  4. Ischemic cardiomyopathy with EF of 25%/AICD and elevated troponin: Consult cardiology Due to problem #1 and recent hypotension will hold blood pressure medications at this time. Telemetry Follow troponins   5. History of atrial fibrillation on anticoagulation:  Continue amiodarone and hold Coumadin due to problem #1.   6. Diabetes: Continue sliding scale insulin. Diabetes coordinator consult. Hold outpatient medications as patient is nothing by mouth.  7. History depression and insomnia: Continue trazodone at bedtime when necessary  8. History of gout: Continue allopurinol  9. History of OSA: CPAP ordered 10. Hypokalemia: Replete and check magnesium level.  All the records are reviewed and case discussed with ED provider. Management plans discussed with the patient and he in agreement  CODE STATUS: FULL  CRITICAL CARE TOTAL TIME TAKING CARE OF THIS PATIENT: 50 minutes.  Patient with multiple comorbidities at high risk for decompensation  Estefana Taylor M.D on January 31, 2016 at 3:43 PM  Between 7am to 6pm - Pager - [phone]  After 6pm go to [email or web address] - Social research officer, government  [facility]  Office  [phone]  CC: Primary care physician; Lynnea Ferrier, MD

## 2016-01-01 NOTE — ED Triage Notes (Signed)
Pt is in custody with [employer] office, cuffs off after arriving to ER, pt reports rectal bleeding for 5 days and coughing up blood, pt reports being off his Coumadin while in jail for two weeks

## 2016-01-01 NOTE — Progress Notes (Signed)
Pt states he does not wear a CPAP and does not want to wear one unless he absolutely has to. RN aware of pt response. Pt  Is resting comfortably at this time.

## 2016-01-01 NOTE — ED Provider Notes (Signed)
[facility] Emergency Department Provider Note   ____________________________________________    I have reviewed the triage vital signs and the nursing notes.   HISTORY  Chief Complaint Rectal Bleeding     HPI Nicholas Leonard is a 70 y.o. male who presents with rectal bleeding and vomiting blood. Patient is presenting from jail. apparently he has been having rectal bleeding over the last several days. He reports he started vomiting blood yesterday. He feels weak and dizzy. He states this is happened before and he was hospitalized for in the past. He denies abdominal pain, reportedly has not taken eliquis in 2 weeks  Past Medical History:  Diagnosis Date  . AICD (automatic cardioverter/defibrillator) present   . Atrial fibrillation (HCC)   . CHF (congestive heart failure) (HCC)    chronic left-sided systolic  . Chronic kidney disease   . Decubitus ulcer on ankle   . Diabetes mellitus without complication (HCC)   . DVT (deep venous thrombosis) (HCC)   . DVT (deep venous thrombosis) (HCC)   . Gout   . Hyperthyroidism   . Ischemic cardiomyopathy   . Myocardial infarction (HCC)   . Neuropathy (HCC)   . Osteoarthritis   . 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 Shortness of breath dyspnea   . Thyroid disease    hypothyroidism  . Venous stasis ulcer (HCC)   . Ventricular fibrillation [facility](HCC)     Patient Active Problem List   Diagnosis Date Noted  . Congestive heart failure (HCC) 12/26/2015  . H/O: gout 12/26/2015  . HLD (hyperlipidemia) 12/26/2015  . BP (high blood pressure) 12/26/2015  . Obstructive apnea 12/26/2015  . Chronic kidney disease (CKD), stage III (moderate) 10/09/2014  . Complication of diabetes mellitus (HCC) 10/09/2014  . Acquired atrophy of thyroid 05/22/2014  . 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 Atrial fibrillation (HCC) 10/02/2013  . H/O deep  venous thrombosis 09/12/2013    Past Surgical History:  Procedure Laterality Date  . CORONARY ANGIOPLASTY    . FISTULAGRAM ([facility] HX)  07/02/2014  . INSERT / REPLACE / REMOVE PACEMAKER    . IVC filter    . PERIPHERAL VASCULAR CATHETERIZATION Right 09/03/2014   Procedure: Lower Extremity Angiography;  Surgeon: Renford DillsGregory G Schnier, MD;  Location: [facility] INVASIVE CV LAB;  Service: Cardiovascular;  Laterality: Right;  . PERIPHERAL VASCULAR CATHETERIZATION Right 09/03/2014   Procedure: Lower Extremity Intervention;  Surgeon: Renford DillsGregory G Schnier, MD;  Location: [facility] INVASIVE CV LAB;  Service: Cardiovascular;  Laterality: Right;    Prior to Admission medications   Medication Sig Start Date End Date Taking? Authorizing Provider  allopurinol (ZYLOPRIM) 100 MG tablet Take 100 mg by mouth daily.    Historical Provider, MD  amiodarone (PACERONE) 200 MG tablet Take 200 mg by mouth daily.    Historical Provider, MD  apixaban (ELIQUIS) 2.5 MG TABS tablet Take 2.5 mg by mouth 2 (two) times daily.    Historical Provider, MD  atorvastatin (LIPITOR) 80 MG tablet Take 80 mg by mouth at bedtime.     Historical Provider, MD  calcitRIOL (ROCALTROL) 0.25 MCG capsule Take 0.25 mcg by mouth daily.     Historical Provider, MD  colchicine (COLCRYS) 0.6 MG tablet Take 0.6 mg by mouth daily as needed (for gout flares).     Historical Provider, MD  gabapentin (NEURONTIN) 100 MG capsule Take 100 mg by mouth 2 (two) times daily.     Historical  Provider, MD  glimepiride (AMARYL) 2 MG tablet Take 2 mg by mouth daily with breakfast.     Historical Provider, MD  insulin glargine (LANTUS) 100 UNIT/ML injection Inject 0.4 mLs (40 Units total) into the skin daily. 06/05/15   Katha Hamming, MD  levothyroxine (SYNTHROID, LEVOTHROID) 150 MCG tablet Take 150 mcg by mouth daily before breakfast.    Historical Provider, MD  metolazone (ZAROXOLYN) 5 MG tablet Take 5 mg by mouth once a week. Pt takes on Monday.    Historical Provider, MD    metoprolol tartrate (LOPRESSOR) 25 MG tablet Take 1 tablet (25 mg total) by mouth 2 (two) times daily. 06/05/15   Katha Hamming, MD  potassium chloride SA (K-DUR,KLOR-CON) 20 MEQ tablet Take 20 mEq by mouth 2 (two) times daily.     Historical Provider, MD     Allergies Niaspan [niacin er] and Tape  Family History  Problem Relation Age of Onset  . Heart disease Mother   . Hypertension Mother   . Heart disease Father   . Hypertension Father   . Heart disease Paternal Grandmother   . Hypertension Paternal Grandmother   . Stroke Paternal Grandmother   . Heart disease Paternal Grandfather   . Hypertension Paternal Grandfather   . Stroke Paternal Grandfather     Social History Social History  Substance Use Topics  . Smoking status: Former Smoker    Quit date: 07/17/1982  . Smokeless tobacco: Not on file  . Alcohol use No    Review of Systems  Constitutional: No fever/chills, Positive dizziness  Eyes: No visual changes.  ENT: No sore throat. Cardiovascular: Denies chest pain. Respiratory: Denies shortness of breath. Gastrointestinal: Rectal bleeding as above  Musculoskeletal: Negative for back pain. Skin: Negative for rash. Neurological: Negative for headaches   10-point ROS otherwise negative.  ____________________________________________   PHYSICAL EXAM:  VITAL SIGNS: ED Triage Vitals  Enc Vitals Group     BP January 12, 2016 1402 (!) 70/44     Pulse Rate 12-Jan-2016 1402 91     Resp 01-12-2016 1402 18     Temp 2016-01-12 1402 97.7 F (36.5 C)     Temp src --      SpO2 2016-01-12 1402 96 %     Weight 01-12-16 1403 211 lb (95.7 kg)     Height 01-12-16 1403 6' (1.829 m)     Head Circumference --      Peak Flow --      Pain Score --      Pain Loc --      Pain Edu? --      Excl. in GC? --     Constitutional: Alert and oriented.Ill-appearing Eyes: Conjunctivae are normal.  Head: Atraumatic. Nose: No epistaxis Mouth/Throat: Mucous membranes are moist.   Neck:   Painless ROM Cardiovascular: Normal rate, regular rhythm. Grossly normal heart sounds.  Good peripheral circulation. Respiratory: Normal respiratory effort.  No retractions. Lungs CTAB. Gastrointestinal: Soft and nontender. No distention.  No CVA tenderness. Reddish brown stool, guaiac positive Genitourinary: deferred Musculoskeletal: No lower extremity tenderness nor edema.  Warm and well perfused Neurologic:  Normal speech and language. No gross focal neurologic deficits are appreciated.  Skin:  Skin is warm, dry and intact. No rash noted. Psychiatric: Mood and affect are normal. Speech and behavior are normal.  ____________________________________________   LABS (all labs ordered are listed, but only abnormal results are displayed)  Labs Reviewed  COMPREHENSIVE METABOLIC PANEL - Abnormal; Notable for the following:  Result Value   Sodium 132 (*)    Potassium 2.9 (*)    Chloride 80 (*)    Glucose, Bld 192 (*)    BUN 157 (*)    Creatinine, Ser 5.27 (*)    AST 125 (*)    ALT 103 (*)    GFR calc non Af Amer 10 (*)    GFR calc Af Amer 12 (*)    Anion gap 22 (*)    All other components within normal limits  CBC - Abnormal; Notable for the following:    WBC 23.4 (*)    RBC 3.85 (*)    Hemoglobin 11.5 (*)    HCT 34.2 (*)    RDW 15.4 (*)    All other components within normal limits  APTT - Abnormal; Notable for the following:    aPTT 89 (*)    All other components within normal limits  PROTIME-INR - Abnormal; Notable for the following:    Prothrombin Time >90.0 (*)    INR >11.00 (*)    All other components within normal limits  TROPONIN I - Abnormal; Notable for the following:    Troponin I 0.16 (*)    All other components within normal limits  BLOOD GAS, VENOUS  TYPE AND SCREEN  PREPARE RBC (CROSSMATCH)  PREPARE FRESH FROZEN PLASMA  ABO/RH   ____________________________________________  EKG  ED ECG REPORT I, Jene Every, the attending physician, personally  viewed and interpreted this ECG.  Date: 12/29/2015 EKG Time: 1:55 PM Rate: 93 Rhythm: normal sinus rhythm QRS Axis: normal Intervals: normal ST/T Wave abnormalities: normal Conduction Disturbances: Right bundle branch block   ____________________________________________  RADIOLOGY   None ____________________________________________   PROCEDURES  Procedure(s) performed: No    Critical Care performed: yes  CRITICAL CARE Performed by: Jene Every   Total critical care time:50 minutes  Critical care time was exclusive of separately billable procedures and treating other patients.  Critical care was necessary to treat or prevent imminent or life-threatening deterioration.  Critical care was time spent personally by me on the following activities: development of treatment plan with patient and/or surrogate as well as nursing, discussions with consultants, evaluation of patient's response to treatment, examination of patient, obtaining history from patient or surrogate, ordering and performing treatments and interventions, ordering and review of laboratory studies, ordering and review of radiographic studies, pulse oximetry and re-evaluation of patient's condition.  ____________________________________________   INITIAL IMPRESSION / ASSESSMENT AND PLAN / ED COURSE  Pertinent labs & imaging results that were available during my care of the patient were reviewed by me and considered in my medical decision making (see chart for details).  Patient presents with hematemesis and rectal bleeding. Apparently he has had this in the past, review of hospital records deficits last admission for hyponatremia and electrolyte abnormalities. Patient is hypertensive in the emergency department. He is guaiac positive from below. He has dried blood on his shirt from vomiting. We will give emergency release blood. IV fluids are infusing.   Clinical Course  Value Comment By Time    Hemoglobin: (!) 11.5 (Reviewed) Jene Every, MD 09/21 1419   Hemoglobin is 11.5, white count is elevated but patient is afebrile and suspect this is related to GI bleed  ----------------------------------------- 3:10 PM on 12/27/2015 -----------------------------------------  Patient's INR is greater than 11, he is in acute renal failure as well, this is likely potentiated the eliquis despite the fact he has not had it in 2 weeks. I have ordered FFP  and will discuss with pharmacy whether Kcentra may be more appropriate or should be added.  Consulted nephrology Dr. Thedore Mins Consulted GI Dr. Servando Snare Discussed with ICU who feels patient should go to stepdown initially Admitted to Dr. Juliene Pina ____________________________________________   FINAL CLINICAL IMPRESSION(S) / ED DIAGNOSES  Final diagnoses:  Acute GI bleeding      NEW MEDICATIONS STARTED DURING THIS VISIT:  New Prescriptions   No medications on file     Note:  This document was prepared using Dragon voice recognition software and may include unintentional dictation errors.    Jene Every, MD 12/23/2015 [phone]

## 2016-01-01 NOTE — ED Notes (Signed)
INR greater than 11 reported to DR Liberty MutualKinner

## 2016-01-01 NOTE — Progress Notes (Signed)
ANTICOAGULATION CONSULT NOTE - Initial Consult  Pharmacy Consult for [facility]  Indication: supra therapeutic INR   Allergies  Allergen Reactions  . Niaspan [Niacin Er] Other (See Comments)    Reaction:  Unknown   . Tape Rash    Patient Measurements: Height: 6' (182.9 cm) Weight: 203 lb 14.8 oz (92.5 kg) IBW/kg (Calculated) : 77.6 Heparin Dosing Weight:   Vital Signs: Temp: 98.6 F (37 C) (09/21 2000) Temp Source: Oral (09/21 1618) BP: 83/52 (09/21 2000) Pulse Rate: 66 (09/21 2000)  Labs:  Recent Labs  2015-12-21 1400 2015-12-21 1617 2015-12-21 2006  HGB 11.5* 11.9*  --   HCT 34.2* 34.2*  --   PLT 196  --   --   APTT 89*  --   --   LABPROT >90.0*  --  14.5  INR >11.00*  --  1.12  CREATININE 5.27*  --   --   TROPONINI 0.16* 0.13*  --     Estimated Creatinine Clearance: 14.3 mL/min (by C-G formula based on SCr of 5.27 mg/dL (H)).   Medical History: Past Medical History:  Diagnosis Date  . AICD (automatic cardioverter/defibrillator) present   . Atrial fibrillation (HCC)   . CHF (congestive heart failure) (HCC)    chronic left-sided systolic  . Chronic kidney disease   . Decubitus ulcer on ankle   . Diabetes mellitus without complication (HCC)   . DVT (deep venous thrombosis) (HCC)   . DVT (deep venous thrombosis) (HCC)   . Gout   . Hyperthyroidism   . Ischemic cardiomyopathy   . Myocardial infarction (HCC)   . Neuropathy (HCC)   . Osteoarthritis   . 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 Shortness of breath dyspnea   . Thyroid disease    hypothyroidism  . Venous stasis ulcer (HCC)   . Ventricular fibrillation (HCC)     Medications:  Prescriptions Prior to Admission  Medication Sig Dispense Refill Last Dose  . allopurinol (ZYLOPRIM) 100 MG tablet Take 200 mg by mouth every morning.    04/22/2015 at Unknown time  . amiodarone (PACERONE) 200 MG tablet Take 200 mg by mouth every morning.    04/22/2015 at Unknown time  .  atorvastatin (LIPITOR) 80 MG tablet Take 80 mg by mouth every evening.    12/31/2015 at Unknown time  . furosemide (LASIX) 40 MG tablet Take 40 mg by mouth 2 (two) times daily.   04/22/2015 at Unknown time  . hydrochlorothiazide (HYDRODIURIL) 25 MG tablet Take 25 mg by mouth every morning.   04/22/2015 at Unknown time  . levothyroxine (SYNTHROID, LEVOTHROID) 150 MCG tablet Take 150 mcg by mouth daily before breakfast.   04/22/2015 at Unknown time  . metoprolol tartrate (LOPRESSOR) 25 MG tablet Take 1 tablet (25 mg total) by mouth 2 (two) times daily. 60 tablet 0 12/31/2015 at Unknown time  . potassium chloride SA (K-DUR,KLOR-CON) 20 MEQ tablet Take 20 mEq by mouth every morning.    04/22/2015 at Unknown time  . insulin glargine (LANTUS) 100 UNIT/ML injection Inject 0.4 mLs (40 Units total) into the skin daily. 10 mL 2     Assessment: 9/21 :   INR @ 14:00 = 11.0              INR @ 20:00 = 1.12   Goal of Therapy:  INR < 2   Plan:  Will draw INR every 4 hrs X 6 and then daily.   Nakaiya Beddow D 04/22/2015,8:54 PM

## 2016-01-01 NOTE — Consult Note (Signed)
Midge Minium, MD [facility]  [address], [address] [place], [place] [address] Phone: [phone] Fax : [phone]  Consultation  Referring Provider:     No ref. provider found Primary Care Physician:  Lynnea Ferrier, MD Primary Gastroenterologist:           Reason for Consultation:     GI bleed  Date of Admission:  12/31/2015 Date of Consultation:  01/08/2016         HPI:   Nicholas Leonard is a 70 y.o. male who is admitted with a few days of rectal bleeding and vomiting blood.  The patient reports that he is on Coumadin although  The emergency room stated the patient was on Eliquis.  The patient states that he has a history of clots  For which she takes his blood thinner.  The patient also was admitted with a hemoglobin of 11.5 despite leading to last few days.  He was  Deemed hypotensive and he was given a unit of blood prior to his hemoglobin coming back at 11.5.   The patient denies any abdominal pain or fevers.  The patient is presently incarcerated..  On admission the patient was also found to have a elevated INR at 11  And he was also found to have  a creatinine that was  Elevated. His liver enzymes were increased with his AST Being 125 with his ALP of 103.  The patient was admitted to the ICU for further treatment.  Past Medical History:  Diagnosis Date  . AICD (automatic cardioverter/defibrillator) present   . Atrial fibrillation (HCC)   . CHF (congestive heart failure) (HCC)    chronic left-sided systolic  . Chronic kidney disease   . Decubitus ulcer on ankle   . Diabetes mellitus without complication (HCC)   . DVT (deep venous thrombosis) (HCC)   . DVT (deep venous thrombosis) (HCC)   . Gout   . Hyperthyroidism   . Ischemic cardiomyopathy   . Myocardial infarction (HCC)   . Neuropathy (HCC)   . Osteoarthritis   . 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 Shortness of breath dyspnea   . Thyroid disease    hypothyroidism  . Venous stasis  ulcer (HCC)   . Ventricular fibrillation [facility])     Past Surgical History:  Procedure Laterality Date  . CORONARY ANGIOPLASTY    . FISTULAGRAM ([facility] HX)  07/02/2014  . INSERT / REPLACE / REMOVE PACEMAKER    . IVC filter    . PERIPHERAL VASCULAR CATHETERIZATION Right 09/03/2014   Procedure: Lower Extremity Angiography;  Surgeon: Renford Dills, MD;  Location: [facility] INVASIVE CV LAB;  Service: Cardiovascular;  Laterality: Right;  . PERIPHERAL VASCULAR CATHETERIZATION Right 09/03/2014   Procedure: Lower Extremity Intervention;  Surgeon: Renford Dills, MD;  Location: [facility] INVASIVE CV LAB;  Service: Cardiovascular;  Laterality: Right;    Prior to Admission medications   Medication Sig Start Date End Date Taking? Authorizing Provider  allopurinol (ZYLOPRIM) 100 MG tablet Take 200 mg by mouth every morning.    Yes Historical Provider, MD  amiodarone (PACERONE) 200 MG tablet Take 200 mg by mouth every morning.    Yes Historical Provider, MD  atorvastatin (LIPITOR) 80 MG tablet Take 80 mg by mouth every evening.    Yes Historical Provider, MD  furosemide (LASIX) 40 MG tablet Take 40 mg by mouth 2 (two) times daily.   Yes Historical Provider, MD  hydrochlorothiazide (HYDRODIURIL) 25 MG tablet Take  25 mg by mouth every morning.   Yes Historical Provider, MD  levothyroxine (SYNTHROID, LEVOTHROID) 150 MCG tablet Take 150 mcg by mouth daily before breakfast.   Yes Historical Provider, MD  metoprolol tartrate (LOPRESSOR) 25 MG tablet Take 1 tablet (25 mg total) by mouth 2 (two) times daily. 06/05/15  Yes Katha Hamming, MD  potassium chloride SA (K-DUR,KLOR-CON) 20 MEQ tablet Take 20 mEq by mouth every morning.    Yes Historical Provider, MD  insulin glargine (LANTUS) 100 UNIT/ML injection Inject 0.4 mLs (40 Units total) into the skin daily. 06/05/15   Katha Hamming, MD    Family History  Problem Relation Age of Onset  . Heart disease Mother   . Hypertension Mother   . Heart disease  Father   . Hypertension Father   . Heart disease Paternal Grandmother   . Hypertension Paternal Grandmother   . Stroke Paternal Grandmother   . Heart disease Paternal Grandfather   . Hypertension Paternal Grandfather   . Stroke Paternal Grandfather      Social History  Substance Use Topics  . Smoking status: Former Smoker    Quit date: 07/17/1982  . Smokeless tobacco: Not on file  . Alcohol use No    Allergies as of 12/20/2015 - Review Complete 12/22/2015  Allergen Reaction Noted  . Niaspan [niacin er] Other (See Comments) 07/17/2014  . Tape Rash 06/03/2015    Review of Systems:    All systems reviewed and negative except where noted in HPI.   Physical Exam:  Vital signs in last 24 hours: Temp:  [97.6 F (36.4 C)-98.9 F (37.2 C)] 98.6 F (37 C) (09/21 2000) Pulse Rate:  [40-99] 66 (09/21 2000) Resp:  [12-25] 17 (09/21 2000) BP: (70-120)/(44-98) 83/52 (09/21 2000) SpO2:  [86 %-100 %] 100 % (09/21 2000) Weight:  [203 lb 14.8 oz (92.5 kg)-211 lb (95.7 kg)] 203 lb 14.8 oz (92.5 kg) (09/21 1618) Last BM Date: 12/27/2015 General:   Pleasant, cooperative in NAD Head:  Normocephalic and atraumatic. Eyes:   No icterus.   Conjunctiva pink. PERRLA. Ears:  Normal auditory acuity. Neck:  Supple; no masses or thyroidomegaly Lungs: Respirations even and unlabored. Lungs clear to auscultation bilaterally.   No wheezes, crackles, or rhonchi.  Heart:  Regular rate and rhythm;  Without murmur, clicks, rubs or gallops Abdomen:  Soft, nondistended, nontender. Normal bowel sounds. No appreciable masses or hepatomegaly.  No rebound or guarding.  Rectal:  Not performed. Msk:  Symmetrical without gross deformities.    Extremities:  Without edema, cyanosis or clubbing. Neurologic:  Alert and oriented x3;  grossly normal neurologically. Skin:  Intact without significant lesions or rashes. Cervical Nodes:  No significant cervical adenopathy. Psych:  Alert and cooperative. Normal affect.  LAB  RESULTS:  Recent Labs  12/17/2015 1400 12/12/2015 1617  WBC 23.4*  --   HGB 11.5* 11.9*  HCT 34.2* 34.2*  PLT 196  --    BMET  Recent Labs  01/09/2016 1400  NA 132*  K 2.9*  CL 80*  CO2 30  GLUCOSE 192*  BUN 157*  CREATININE 5.27*  CALCIUM 9.6   LFT  Recent Labs  12/12/2015 1400  PROT 7.9  ALBUMIN 3.9  AST 125*  ALT 103*  ALKPHOS 85  BILITOT 1.2   PT/INR  Recent Labs  12/18/2015 1400 12/30/2015 2006  LABPROT >90.0* 14.5  INR >11.00* 1.12    STUDIES: Dg Chest 1 View  Result Date: 12/26/2015 CLINICAL DATA:  Rectal bleeding for 5 days,  dizziness, weakness EXAM: CHEST 1 VIEW COMPARISON:  06/25/2014 FINDINGS: Cardiomediastinal silhouette is stable. Elevation of the right hemidiaphragm again noted. Status post CABG. Single lead cardiac pacemaker is unchanged in position. No infiltrate or pulmonary edema. Stable scarring or atelectasis in lingula. IMPRESSION: No active disease. Electronically Signed   By: Natasha MeadLiviu  Pop M.D.   On: 2015-06-28 15:52      Impression / Plan:   Nicholas Leonard is a 70 y.o. y/o male with a history of ischemic cardiomyopathy and chronic anticoagulation.  The patient was admitted with increased liver enzymes and a high INR with increased creatinine.  The patient was also reported to have rectal bleeding and vomiting blood  For the last few days despite a hemoglobin of 11.5.  The patient will have his blood checked for possible causes of his abnormal liver enzymes. His increased INR is likely due to his Coumadin. The patient  will be followed for any further bleeding and for his abnormal liver enzymes.  Thank you for involving me in the care of this patient.      LOS: 0 days   Midge Miniumarren Allanna Bresee, MD  May 01, 2015, 9:11 PM   Note: This dictation was prepared with Dragon dictation along with smaller phrase technology. Any transcriptional errors that result from this process are unintentional.

## 2016-01-01 NOTE — ED Notes (Signed)
Sheriff at bedside, pt resting in bed

## 2016-01-01 NOTE — ED Notes (Signed)
Pt complains of rectal bleeding and coughing up blood for 5 days, pt is pale and grey in color, no bleeding noted during triage

## 2016-01-01 NOTE — Progress Notes (Signed)
PARENTERAL NUTRITION CONSULT NOTE - INITIAL  Pharmacy Consult for Electrolyte Monitoring Indication: Hypokalemia  Allergies  Allergen Reactions  . Niaspan [Niacin Er] Other (See Comments)    Reaction:  Unknown   . Tape Rash    Patient Measurements: Height: 6' (182.9 cm) Weight: 211 lb (95.7 kg) IBW/kg (Calculated) : 77.6  Vital Signs: Temp: 98.9 F (37.2 C) (09/21 1536) Temp Source: Oral (09/21 1536) BP: 87/60 (09/21 1536) Pulse Rate: 85 (09/21 1536) Intake/Output from previous day: No intake/output data recorded. Intake/Output from this shift: No intake/output data recorded.  Labs:  Recent Labs  2015/10/26 1400  WBC 23.4*  HGB 11.5*  HCT 34.2*  PLT 196  APTT 89*  INR >11.00*     Recent Labs  2015/10/26 1400  NA 132*  K 2.9*  CL 80*  CO2 30  GLUCOSE 192*  BUN 157*  CREATININE 5.27*  CALCIUM 9.6  PROT 7.9  ALBUMIN 3.9  AST 125*  ALT 103*  ALKPHOS 85  BILITOT 1.2   Estimated Creatinine Clearance: 15.6 mL/min (by C-G formula based on SCr of 5.27 mg/dL (H)).    Recent Labs  2015/10/26 1405  GLUCAP 179*    Medical History: Past Medical History:  Diagnosis Date  . AICD (automatic cardioverter/defibrillator) present   . Atrial fibrillation (HCC)   . CHF (congestive heart failure) (HCC)    chronic left-sided systolic  . Chronic kidney disease   . Decubitus ulcer on ankle   . Diabetes mellitus without complication (HCC)   . DVT (deep venous thrombosis) (HCC)   . DVT (deep venous thrombosis) (HCC)   . Gout   . Hyperthyroidism   . Ischemic cardiomyopathy   . Myocardial infarction (HCC)   . Neuropathy (HCC)   . Osteoarthritis   . 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 Shortness of breath dyspnea   . Thyroid disease    hypothyroidism  . Venous stasis ulcer (HCC)   . Ventricular fibrillation (HCC)     Medications:  Scheduled:  . octreotide  50 mcg Intravenous Once  . pantoprazole (PROTONIX) IVPB  80 mg  Intravenous Once  . [START ON 01/05/2016] pantoprazole  40 mg Intravenous Q12H  . phytonadione (VITAMIN K) IV  10 mg Intravenous STAT  . potassium chloride  10 mEq Intravenous Q1 Hr x 5  . prothrombin complex conc human (Kcentra) IVPB  4,957 Units Intravenous STAT  . sodium chloride flush  3 mL Intravenous Q12H     Assessment: Pharmacy consulted to assist in managing electrolytes in this 70 y/o M admitted with GIB.   Plan:  Will replace potassium with 5 runs IV and replace magnesium as necessary. F/U AM labs.   Luisa HartChristy, Tomisha Reppucci D Dec 06, 2015,4:15 PM

## 2016-01-01 NOTE — ED Notes (Signed)
Troponin 0.16 reported to Dr Cyril LoosenKinner

## 2016-01-01 NOTE — ED Notes (Signed)
Informed RN bed ready 

## 2016-01-02 ENCOUNTER — Inpatient Hospital Stay: Payer: Medicare Other

## 2016-01-02 ENCOUNTER — Encounter: Payer: Self-pay | Admitting: *Deleted

## 2016-01-02 DIAGNOSIS — N179 Acute kidney failure, unspecified: Secondary | ICD-10-CM

## 2016-01-02 DIAGNOSIS — J9690 Respiratory failure, unspecified, unspecified whether with hypoxia or hypercapnia: Secondary | ICD-10-CM

## 2016-01-02 DIAGNOSIS — J189 Pneumonia, unspecified organism: Secondary | ICD-10-CM

## 2016-01-02 DIAGNOSIS — I472 Ventricular tachycardia, unspecified: Secondary | ICD-10-CM

## 2016-01-02 DIAGNOSIS — Z95810 Presence of automatic (implantable) cardiac defibrillator: Secondary | ICD-10-CM

## 2016-01-02 DIAGNOSIS — Z01818 Encounter for other preprocedural examination: Secondary | ICD-10-CM

## 2016-01-02 DIAGNOSIS — K922 Gastrointestinal hemorrhage, unspecified: Secondary | ICD-10-CM

## 2016-01-02 DIAGNOSIS — I959 Hypotension, unspecified: Secondary | ICD-10-CM

## 2016-01-02 LAB — BASIC METABOLIC PANEL
ANION GAP: 15 (ref 5–15)
Anion gap: 15 (ref 5–15)
Anion gap: 13 (ref 5–15)
BUN: 137 mg/dL — ABNORMAL HIGH (ref 6–20)
BUN: 113 mg/dL — AB (ref 6–20)
BUN: 116 mg/dL — AB (ref 6–20)
CHLORIDE: 97 mmol/L — AB (ref 101–111)
CHLORIDE: 97 mmol/L — AB (ref 101–111)
CO2: 28 mmol/L (ref 22–32)
CO2: 22 mmol/L (ref 22–32)
CO2: 23 mmol/L (ref 22–32)
CREATININE: 3.92 mg/dL — AB (ref 0.61–1.24)
Calcium: 8.3 mg/dL — ABNORMAL LOW (ref 8.9–10.3)
Calcium: 8.5 mg/dL — ABNORMAL LOW (ref 8.9–10.3)
Calcium: 8.5 mg/dL — ABNORMAL LOW (ref 8.9–10.3)
Chloride: 91 mmol/L — ABNORMAL LOW (ref 101–111)
Creatinine, Ser: 4.12 mg/dL — ABNORMAL HIGH (ref 0.61–1.24)
Creatinine, Ser: 3.41 mg/dL — ABNORMAL HIGH (ref 0.61–1.24)
GFR calc Af Amer: 16 mL/min — ABNORMAL LOW (ref >60)
GFR calc Af Amer: 20 mL/min — ABNORMAL LOW (ref >60)
GFR calc Af Amer: 16 mL/min — ABNORMAL LOW (ref >60)
GFR calc non Af Amer: 17 mL/min — ABNORMAL LOW (ref >60)
GFR calc non Af Amer: 14 mL/min — ABNORMAL LOW (ref >60)
GFR, EST NON AFRICAN AMERICAN: 13 mL/min — AB (ref >60)
Glucose, Bld: 132 mg/dL — ABNORMAL HIGH (ref 65–99)
Glucose, Bld: 285 mg/dL — ABNORMAL HIGH (ref 65–99)
Glucose, Bld: 317 mg/dL — ABNORMAL HIGH (ref 65–99)
POTASSIUM: 3.1 mmol/L — AB (ref 3.5–5.1)
POTASSIUM: 3.6 mmol/L (ref 3.5–5.1)
Potassium: 4 mmol/L (ref 3.5–5.1)
SODIUM: 134 mmol/L — AB (ref 135–145)
SODIUM: 134 mmol/L — AB (ref 135–145)
Sodium: 133 mmol/L — ABNORMAL LOW (ref 135–145)

## 2016-01-02 LAB — MAGNESIUM
MAGNESIUM: 2 mg/dL (ref 1.7–2.4)
Magnesium: 2.5 mg/dL — ABNORMAL HIGH (ref 1.7–2.4)
Magnesium: 2.2 mg/dL (ref 1.7–2.4)
Magnesium: 2.5 mg/dL — ABNORMAL HIGH (ref 1.7–2.4)

## 2016-01-02 LAB — COMPREHENSIVE METABOLIC PANEL
ALK PHOS: 69 U/L (ref 38–126)
ALT: 102 U/L — AB (ref 17–63)
AST: 129 U/L — ABNORMAL HIGH (ref 15–41)
Albumin: 2.9 g/dL — ABNORMAL LOW (ref 3.5–5.0)
Anion gap: 16 — ABNORMAL HIGH (ref 5–15)
BUN: 126 mg/dL — ABNORMAL HIGH (ref 6–20)
CALCIUM: 9.1 mg/dL (ref 8.9–10.3)
CO2: 25 mmol/L (ref 22–32)
CREATININE: 3.71 mg/dL — AB (ref 0.61–1.24)
Chloride: 94 mmol/L — ABNORMAL LOW (ref 101–111)
GFR calc non Af Amer: 15 mL/min — ABNORMAL LOW (ref >60)
GFR, EST AFRICAN AMERICAN: 18 mL/min — AB (ref >60)
GLUCOSE: 206 mg/dL — AB (ref 65–99)
Potassium: 2.9 mmol/L — ABNORMAL LOW (ref 3.5–5.1)
SODIUM: 135 mmol/L (ref 135–145)
Total Bilirubin: 2.1 mg/dL — ABNORMAL HIGH (ref 0.3–1.2)
Total Protein: 6.1 g/dL — ABNORMAL LOW (ref 6.5–8.1)

## 2016-01-02 LAB — URINALYSIS COMPLETE WITH MICROSCOPIC (ARMC ONLY)
BILIRUBIN URINE: NEGATIVE
NITRITE: NEGATIVE
Protein, ur: 100 mg/dL — AB
Specific Gravity, Urine: 1.012 (ref 1.005–1.030)
pH: 5 (ref 5.0–8.0)

## 2016-01-02 LAB — IRON AND TIBC
IRON: 41 ug/dL — AB (ref 45–182)
SATURATION RATIOS: 14 % — AB (ref 17.9–39.5)
TIBC: 304 ug/dL (ref 250–450)
UIBC: 263 ug/dL

## 2016-01-02 LAB — CBC
HEMATOCRIT: 32.7 % — AB (ref 40.0–52.0)
HEMATOCRIT: 31.1 % — AB (ref 40.0–52.0)
HEMOGLOBIN: 11.3 g/dL — AB (ref 13.0–18.0)
HEMOGLOBIN: 10.9 g/dL — AB (ref 13.0–18.0)
MCH: 30.7 pg (ref 26.0–34.0)
MCH: 30.9 pg (ref 26.0–34.0)
MCHC: 34.6 g/dL (ref 32.0–36.0)
MCHC: 35 g/dL (ref 32.0–36.0)
MCV: 88.8 fL (ref 80.0–100.0)
MCV: 88.4 fL (ref 80.0–100.0)
Platelets: 137 10*3/uL — ABNORMAL LOW (ref 150–440)
Platelets: 116 10*3/uL — ABNORMAL LOW (ref 150–440)
RBC: 3.68 MIL/uL — AB (ref 4.40–5.90)
RBC: 3.51 MIL/uL — AB (ref 4.40–5.90)
RDW: 15.1 % — ABNORMAL HIGH (ref 11.5–14.5)
RDW: 15.6 % — ABNORMAL HIGH (ref 11.5–14.5)
WBC: 10.8 10*3/uL — AB (ref 3.8–10.6)
WBC: 9.2 10*3/uL (ref 3.8–10.6)

## 2016-01-02 LAB — BLOOD GAS, ARTERIAL
Acid-base deficit: 3.6 mmol/L — ABNORMAL HIGH (ref 0.0–2.0)
BICARBONATE: 24.6 mmol/L (ref 20.0–28.0)
FIO2: 1
O2 Saturation: 99.9 %
PH ART: 7.22 — AB (ref 7.350–7.450)
PO2 ART: 290 mmHg — AB (ref 83.0–108.0)
Patient temperature: 37
pCO2 arterial: 60 mmHg — ABNORMAL HIGH (ref 32.0–48.0)

## 2016-01-02 LAB — HEMOGLOBIN AND HEMATOCRIT, BLOOD
HCT: 33 % — ABNORMAL LOW (ref 40.0–52.0)
HEMOGLOBIN: 11.4 g/dL — AB (ref 13.0–18.0)

## 2016-01-02 LAB — TYPE AND SCREEN
ABO/RH(D): A POS
ANTIBODY SCREEN: NEGATIVE
UNIT DIVISION: 0
Unit division: 0

## 2016-01-02 LAB — PROTIME-INR
INR: 1.07
INR: 1.09
INR: 1.1
INR: 1.16
PROTHROMBIN TIME: 13.9 s (ref 11.4–15.2)
PROTHROMBIN TIME: 14.2 s (ref 11.4–15.2)
Prothrombin Time: 14.1 seconds (ref 11.4–15.2)
Prothrombin Time: 14.9 seconds (ref 11.4–15.2)

## 2016-01-02 LAB — GLUCOSE, CAPILLARY
GLUCOSE-CAPILLARY: 218 mg/dL — AB (ref 65–99)
GLUCOSE-CAPILLARY: 305 mg/dL — AB (ref 65–99)
Glucose-Capillary: 193 mg/dL — ABNORMAL HIGH (ref 65–99)
Glucose-Capillary: 230 mg/dL — ABNORMAL HIGH (ref 65–99)
Glucose-Capillary: 335 mg/dL — ABNORMAL HIGH (ref 65–99)

## 2016-01-02 LAB — HEMOGLOBIN A1C
Hgb A1c MFr Bld: 8.4 % — ABNORMAL HIGH (ref 4.8–5.6)
Mean Plasma Glucose: 194 mg/dL

## 2016-01-02 LAB — PHOSPHORUS
Phosphorus: 4.2 mg/dL (ref 2.5–4.6)
Phosphorus: 2.9 mg/dL (ref 2.5–4.6)
Phosphorus: 5.2 mg/dL — ABNORMAL HIGH (ref 2.5–4.6)

## 2016-01-02 LAB — FERRITIN: FERRITIN: 371 ng/mL — AB (ref 24–336)

## 2016-01-02 LAB — POTASSIUM: Potassium: 2.7 mmol/L — CL (ref 3.5–5.1)

## 2016-01-02 MED ORDER — LIDOCAINE IN D5W 4-5 MG/ML-% IV SOLN
2.0000 mg/min | INTRAVENOUS | Status: DC
  Administered 2016-01-03: 2 mg/min via INTRAVENOUS
  Filled 2016-01-02: qty 500

## 2016-01-02 MED ORDER — VECURONIUM BROMIDE 10 MG IV SOLR
INTRAVENOUS | Status: AC
  Filled 2016-01-02: qty 20

## 2016-01-02 MED ORDER — CALCIUM CHLORIDE 10 % IV SOLN
1.0000 g | Freq: Once | INTRAVENOUS | Status: AC
  Administered 2016-01-02: 1 g via INTRAVENOUS

## 2016-01-02 MED ORDER — DOPAMINE-DEXTROSE 3.2-5 MG/ML-% IV SOLN
0.0000 ug/kg/min | INTRAVENOUS | Status: DC
  Administered 2016-01-02: 3 ug/kg/min via INTRAVENOUS
  Filled 2016-01-02: qty 250

## 2016-01-02 MED ORDER — IPRATROPIUM-ALBUTEROL 0.5-2.5 (3) MG/3ML IN SOLN
3.0000 mL | Freq: Four times a day (QID) | RESPIRATORY_TRACT | Status: DC
  Administered 2016-01-02 – 2016-01-03 (×3): 3 mL via RESPIRATORY_TRACT
  Filled 2016-01-02 (×2): qty 3

## 2016-01-02 MED ORDER — FENTANYL 2500MCG IN NS 250ML (10MCG/ML) PREMIX INFUSION
25.0000 ug/h | INTRAVENOUS | Status: DC
  Administered 2016-01-03: 25 ug/h via INTRAVENOUS
  Filled 2016-01-02: qty 250

## 2016-01-02 MED ORDER — MIDAZOLAM HCL 2 MG/2ML IJ SOLN
INTRAMUSCULAR | Status: AC
  Filled 2016-01-02: qty 4

## 2016-01-02 MED ORDER — AMIODARONE IV BOLUS ONLY 150 MG/100ML
150.0000 mg | Freq: Once | INTRAVENOUS | Status: AC
  Administered 2016-01-02: 150 mg via INTRAVENOUS
  Filled 2016-01-02: qty 100

## 2016-01-02 MED ORDER — ORAL CARE MOUTH RINSE
15.0000 mL | Freq: Four times a day (QID) | OROMUCOSAL | Status: DC
  Administered 2016-01-03 (×2): 15 mL via OROMUCOSAL

## 2016-01-02 MED ORDER — MIDAZOLAM HCL 2 MG/2ML IJ SOLN: 4.0000 mg | Freq: Once | INTRAMUSCULAR | Status: DC

## 2016-01-02 MED ORDER — NOREPINEPHRINE BITARTRATE 1 MG/ML IV SOLN: 0.0000 ug/min | INTRAVENOUS | Status: DC

## 2016-01-02 MED ORDER — AMIODARONE HCL IN DEXTROSE 360-4.14 MG/200ML-% IV SOLN
60.0000 mg/h | INTRAVENOUS | Status: DC
  Administered 2016-01-02: 60 mg/h via INTRAVENOUS
  Filled 2016-01-02 (×2): qty 200

## 2016-01-02 MED ORDER — AMIODARONE LOAD VIA INFUSION
150.0000 mg | Freq: Once | INTRAVENOUS | Status: AC
  Administered 2016-01-02: 150 mg via INTRAVENOUS
  Filled 2016-01-02: qty 83.34

## 2016-01-02 MED ORDER — SODIUM CHLORIDE 0.9 % IV SOLN
INTRAVENOUS | Status: DC
  Administered 2016-01-02 (×3): via INTRAVENOUS

## 2016-01-02 MED ORDER — SODIUM CHLORIDE 0.9 % IV BOLUS (SEPSIS)
250.0000 mL | Freq: Once | INTRAVENOUS | Status: AC
  Administered 2016-01-02: 250 mL via INTRAVENOUS

## 2016-01-02 MED ORDER — CHLORHEXIDINE GLUCONATE 0.12 % MT SOLN
15.0000 mL | Freq: Two times a day (BID) | OROMUCOSAL | Status: DC
  Administered 2016-01-02: 15 mL via OROMUCOSAL

## 2016-01-02 MED ORDER — ORAL CARE MOUTH RINSE
15.0000 mL | Freq: Two times a day (BID) | OROMUCOSAL | Status: DC
  Administered 2016-01-02: 15 mL via OROMUCOSAL

## 2016-01-02 MED ORDER — INSULIN ASPART 100 UNIT/ML ~~LOC~~ SOLN
10.0000 [IU] | Freq: Once | SUBCUTANEOUS | Status: AC
  Administered 2016-01-02: 10 [IU] via SUBCUTANEOUS

## 2016-01-02 MED ORDER — LIDOCAINE BOLUS VIA INFUSION
100.0000 mg | Freq: Once | INTRAVENOUS | Status: AC
  Administered 2016-01-02: 100 mg via INTRAVENOUS
  Filled 2016-01-02: qty 100

## 2016-01-02 MED ORDER — FENTANYL BOLUS VIA INFUSION
25.0000 ug | INTRAVENOUS | Status: DC | PRN
  Filled 2016-01-02: qty 25

## 2016-01-02 MED ORDER — SODIUM CHLORIDE 0.9% FLUSH
10.0000 mL | Freq: Two times a day (BID) | INTRAVENOUS | Status: DC
  Administered 2016-01-02: 10 mL

## 2016-01-02 MED ORDER — MIDAZOLAM HCL 2 MG/2ML IJ SOLN
2.0000 mg | Freq: Once | INTRAMUSCULAR | Status: AC
Start: 2016-01-02 — End: 2016-01-02
  Administered 2016-01-02: 2 mg via INTRAVENOUS

## 2016-01-02 MED ORDER — SODIUM CHLORIDE 0.9 % IV BOLUS (SEPSIS): 250.0000 mL | Freq: Once | INTRAVENOUS | Status: DC

## 2016-01-02 MED ORDER — LIDOCAINE IN D5W 4-5 MG/ML-% IV SOLN
2.0000 mg/min | INTRAVENOUS | Status: DC
  Administered 2016-01-02: 2 mg/min via INTRAVENOUS
  Filled 2016-01-02: qty 500

## 2016-01-02 MED ORDER — INSULIN ASPART 100 UNIT/ML ~~LOC~~ SOLN
0.0000 [IU] | Freq: Four times a day (QID) | SUBCUTANEOUS | Status: DC
  Administered 2016-01-03: 5 [IU] via SUBCUTANEOUS
  Filled 2016-01-02: qty 6

## 2016-01-02 MED ORDER — MAGNESIUM SULFATE 50 % IJ SOLN
1.0000 g | Freq: Once | INTRAMUSCULAR | Status: AC
  Administered 2016-01-02: 1 g via INTRAVENOUS
  Filled 2016-01-02: qty 2

## 2016-01-02 MED ORDER — MAGNESIUM SULFATE 50 % IJ SOLN
1.0000 g | Freq: Once | INTRAMUSCULAR | Status: AC
  Administered 2016-01-02: 2 g via INTRAVENOUS

## 2016-01-02 MED ORDER — VECURONIUM BROMIDE 10 MG IV SOLR
10.0000 mg | Freq: Once | INTRAVENOUS | Status: AC
  Administered 2016-01-02: 10 mg via INTRAVENOUS

## 2016-01-02 MED ORDER — LABETALOL HCL 5 MG/ML IV SOLN: 10.0000 mg | INTRAVENOUS | Status: DC | PRN

## 2016-01-02 MED ORDER — SODIUM CHLORIDE 0.9% FLUSH: 10.0000 mL | INTRAVENOUS | Status: DC | PRN

## 2016-01-02 MED ORDER — AMIODARONE HCL IN DEXTROSE 360-4.14 MG/200ML-% IV SOLN
30.0000 mg/h | INTRAVENOUS | Status: DC
  Administered 2016-01-02 (×2): 30 mg/h via INTRAVENOUS
  Filled 2016-01-02 (×2): qty 200

## 2016-01-02 MED ORDER — MIDAZOLAM HCL 2 MG/2ML IJ SOLN: 1.0000 mg | INTRAMUSCULAR | Status: DC | PRN

## 2016-01-02 MED ORDER — DIAZEPAM 5 MG/ML IJ SOLN: 5.0000 mg | Freq: Once | INTRAMUSCULAR | Status: DC

## 2016-01-02 MED ORDER — PNEUMOCOCCAL VAC POLYVALENT 25 MCG/0.5ML IJ INJ: 0.5000 mL | INJECTION | INTRAMUSCULAR | Status: DC

## 2016-01-02 MED ORDER — INFLUENZA VAC SPLIT QUAD 0.5 ML IM SUSY: 0.5000 mL | PREFILLED_SYRINGE | INTRAMUSCULAR | Status: DC

## 2016-01-02 MED ORDER — SODIUM BICARBONATE 8.4 % IV SOLN
50.0000 meq | Freq: Once | INTRAVENOUS | Status: AC
  Administered 2016-01-02: 50 meq via INTRAVENOUS

## 2016-01-02 MED ORDER — POTASSIUM CHLORIDE 10 MEQ/100ML IV SOLN
10.0000 meq | INTRAVENOUS | Status: AC
  Administered 2016-01-02 (×4): 10 meq via INTRAVENOUS
  Filled 2016-01-02 (×4): qty 100

## 2016-01-02 MED ORDER — FENTANYL CITRATE (PF) 100 MCG/2ML IJ SOLN
INTRAMUSCULAR | Status: AC
  Administered 2016-01-02: 100 ug via INTRAVENOUS
  Filled 2016-01-02: qty 4

## 2016-01-02 MED ORDER — POTASSIUM CHLORIDE 10 MEQ/100ML IV SOLN
10.0000 meq | INTRAVENOUS | Status: AC
  Administered 2016-01-02 (×6): 10 meq via INTRAVENOUS
  Filled 2016-01-02 (×6): qty 100

## 2016-01-02 MED ORDER — FENTANYL CITRATE (PF) 100 MCG/2ML IJ SOLN
100.0000 ug | Freq: Once | INTRAMUSCULAR | Status: AC
  Administered 2016-01-02: 100 ug via INTRAVENOUS

## 2016-01-02 MED ORDER — NOREPINEPHRINE 4 MG/250ML-% IV SOLN
12.0000 ug/min | INTRAVENOUS | Status: DC
  Administered 2016-01-02: 5 ug/min via INTRAVENOUS
  Administered 2016-01-03: 20 ug/min via INTRAVENOUS
  Filled 2016-01-02 (×2): qty 250

## 2016-01-02 MED ORDER — MIDAZOLAM HCL 2 MG/2ML IJ SOLN
1.0000 mg | INTRAMUSCULAR | Status: DC | PRN
  Administered 2016-01-03 (×2): 1 mg via INTRAVENOUS
  Filled 2016-01-02 (×2): qty 2

## 2016-01-02 NOTE — Progress Notes (Signed)
At 1645 patient went into sustained VTach.  Dr. Dema SeverinMungal at bedside.  Patient's AICD shocked him 5 times within 5 minutes. Dr. Theodis BlazeKowalsi was paged and called back and spoke with this RN who made him aware of sustained vtach and then Dr. Dema SeverinMungal spoke with Dr. Gwen PoundsKowalski and discussed plan of care.  Dr. Gwen PoundsKowalski gave order for STAT EKG and amiodarone bolus.  Dr. Dema SeverinMungal gave order to give 250cc NS bolus and to restart lidocaine drip at 2mg /min at 1700.  Dr. Dema SeverinMungal also gave order to check STAT mag, phos and bmet.  Patient alert and talking during entire event except when shocked by his AICD.

## 2016-01-02 NOTE — Progress Notes (Addendum)
[facility] - [place] [facility]   PATIENT NAME: Nicholas Leonard    MR#:  [id number]  DATE OF BIRTH:  12-11-1945  SUBJECTIVE:  Came in with rectal bleed and coffee ground emesis.went into sustained  Wide complex tach. Now on amiodarone and lidocaine HR in the 70's. BP in the 80's Not any pressors yet.  REVIEW OF SYSTEMS:   Review of Systems  Constitutional: Negative for chills, fever and weight loss.  HENT: Negative for ear discharge, ear pain and nosebleeds.   Eyes: Negative for blurred vision, pain and discharge.  Respiratory: Negative for sputum production, shortness of breath, wheezing and stridor.   Cardiovascular: Positive for palpitations. Negative for chest pain, orthopnea and PND.  Gastrointestinal: Positive for blood in stool and vomiting. Negative for abdominal pain, diarrhea and nausea.  Genitourinary: Negative for frequency and urgency.  Musculoskeletal: Negative for back pain and joint pain.  Neurological: Positive for weakness. Negative for sensory change, speech change and focal weakness.  Psychiatric/Behavioral: Negative for depression and hallucinations. The patient is not nervous/anxious.    Tolerating Diet:npo Tolerating PT: critically ill  DRUG ALLERGIES:   Allergies  Allergen Reactions  . Niaspan [Niacin Er] Other (See Comments)    Reaction:  Unknown   . Tape Rash    VITALS:  Blood pressure (!) 90/56, pulse 91, temperature 98.3 F (36.8 C), resp. rate 15, height 6' (1.829 m), weight 92.5 kg (203 lb 14.8 oz), SpO2 100 %.  PHYSICAL EXAMINATION:   Physical Exam  GENERAL:  70 y.o.-year-old patient lying in the bed with no acute distress. Critically ill Looks chronically ill EYES: Pupils equal, round, reactive to light and accommodation. No scleral icterus. Extraocular muscles intact.  HEENT: Head atraumatic, normocephalic. Oropharynx and nasopharynx clear.  NECK:  Supple, no jugular venous distention. No thyroid  enlargement, no tenderness.  LUNGS: Normal breath sounds bilaterally, no wheezing, rales, rhonchi. No use of accessory muscles of respiration.  CARDIOVASCULAR: S1, S2 normal. No murmurs, rubs, or gallops. tachycardia ABDOMEN: Soft, nontender, nondistended. Bowel sounds present. No organomegaly or mass.  EXTREMITIES: No cyanosis, clubbing or edema b/l.    NEUROLOGIC: Cranial nerves II through XII are intact. No focal Motor or sensory deficits b/l.   PSYCHIATRIC:  patient is alert and oriented x 3.  SKIN: No obvious rash, lesion, or ulcer.   LABORATORY PANEL:  CBC  Recent Labs Lab 01/02/16 0750  WBC 9.2  HGB 10.9*  HCT 31.1*  PLT 116*    Chemistries   Recent Labs Lab 12/14/2015 1400  01/02/16 0132 01/02/16 0147  NA 132*  --  134*  --   K 2.9*  --  3.1*  --   CL 80*  --  91*  --   CO2 30  --  28  --   GLUCOSE 192*  --  132*  --   BUN 157*  --  137*  --   CREATININE 5.27*  --  4.12*  --   CALCIUM 9.6  --  8.3*  --   MG  --   < >  --  2.0  AST 125*  --   --   --   ALT 103*  --   --   --   ALKPHOS 85  --   --   --   BILITOT 1.2  --   --   --   < > = values in this interval not displayed. Cardiac Enzymes  Recent Labs Lab 12/26/2015 1617  TROPONINI 0.13*   RADIOLOGY:  Dg Chest 1 View  Result Date: June 28, 2015 CLINICAL DATA:  Rectal bleeding for 5 days, dizziness, weakness EXAM: CHEST 1 VIEW COMPARISON:  06/25/2014 FINDINGS: Cardiomediastinal silhouette is stable. Elevation of the right hemidiaphragm again noted. Status post CABG. Single lead cardiac pacemaker is unchanged in position. No infiltrate or pulmonary edema. Stable scarring or atelectasis in lingula. IMPRESSION: No active disease. Electronically Signed   By: Natasha MeadLiviu  Pop M.D.   On: 0March 18, 2017 15:52   ASSESSMENT AND PLAN:  70 year old male with ischemic cardiomyopathy EF of 25% by echocardiogram in 2012, diabetes and former EtOH abuse who presents with GI bleed and supratherapeutic INR.  1. Acute  GI bleed: upper  and lower -cont IV Protonix and octreotide GI consult for EGD -dr Servando Snarewohl consulted Monitor hemoglobin hgb 11.1---1 unit BT---11.2 Transfuse as needed  2. Supratherapeutic INR: Vitamin K and Kcentra given. INR 11.1---1.09 -[facility] has been on the higher side for last few days. His coumadin is on hold since 12/25/15  3. Acute on chronic kidney disease stage III:  Likely due to hypovolemia and h/o cardiomyopathy along with GI bleed Hold nephrotoxic agents. Nephrology consult. -baseline creat 1.91 (feb 2017) -5.27---4.12  4. Ischemic cardiomyopathy with EF of 25%/AICD and elevated troponin with severe sustained wide complex tachycardia -Now on IV Amiodarone gtt and Lidocaine gtt Consulted  Cardiology Dr Gwen Poundskowalski -repleting electrolytes Due to problem #1 and recent hypotension will hold blood pressure medications at this time.  5. History of atrial fibrillation on anticoagulation:  Continue amiodarone and hold Coumadin due to problem #1. As above  6. Diabetes: Continue sliding scale insulin. Diabetes coordinator consult. Hold outpatient medications as patient is nothing by mouth.  7. History depression and insomnia: Continue trazodone at bedtime when necessary  8. History of gout: Continue allopurinol  9. History of OSA: CPAP ordered  10. Hypokalemia: Replete  -magnesium levels ok   Case discussed with Care Management/Social Worker. Management plans discussed with the patient, family and they are in agreement.  CODE STATUS: FULL  DVT Prophylaxis:SCD's TOTAL Critical TIME TAKING CARE OF THIS PATIENT: 45 minutes.  >50% time spent on counselling and coordination of care pt and Drs Gwen PoundsKowalski and Servando Snarewohl  Note: This dictation was prepared with Dragon dictation along with smaller phrase technology. Any transcriptional errors that result from this process are unintentional.  Diamante Rubin M.D on 01/02/2016 at 8:34 AM  Between 7am to 6pm - Pager -  [phone]  After 6pm go to [email or web address] - password [email or web address] [email or web address]  [facility]  Office  [phone]  CC: Primary care physician; Lynnea FerrierBERT J KLEIN III, MD

## 2016-01-02 NOTE — Progress Notes (Signed)
RN discussed with Deputy the need for wife to come and visit due to severity of patient's status. Deputy was able to speak with his superior and and get permission for wife and children to come.  This RN called and spoke with patient's wife, Dondra SpryGail and she and patient's children will be coming to visit.

## 2016-01-02 NOTE — Progress Notes (Signed)
Patient's blood pressure low 69/53 with MAP of 60 and 1st bolus not finished yet.  Dr. Dema SeverinMungal aware and gave order for another 250cc NS bolus.

## 2016-01-02 NOTE — Progress Notes (Signed)
This RN notified Lorin PicketScott, RPH that patient's potassium is 2.7.  Pharmacy will order for replacement.

## 2016-01-02 NOTE — Progress Notes (Signed)
Left msg for gail on [phone]

## 2016-01-02 NOTE — Progress Notes (Deleted)
Dr. Dema SeverinMungal at bedside and patient's blood pressure up at 123/91.  MD gave order to d/c 2nd NS bolus.

## 2016-01-02 NOTE — Procedures (Signed)
  Procedure Note: Right Femoral Central Venous Catheter Placement  Nicholas DesanctisFreddie Edward Leonard , [id number] , IC12A/IC12A-AA  Indications: Hemodynamic monitoring / Intravenous access  Emergent placement of CVL due to hypotensive and needing resuscitation.  A time-out was completed verifying correct patient, procedure and site.  A 3 lumen catheter available at the time of procedure.  The patient was placed in a dependent position appropriate for central line placement based on the vein to be cannulated.   The patient's RIGHT Femoral Vein was prepped and draped in a sterile fashion.  1% Lidocaine WAS NOT used to anesthetize the surrounding skin area.   A 3 lumen catheter was introduced into the RIGHT Femoral Vein using Seldinger technique, visualized under ultrasound.  The catheter was threaded smoothly over the guide wire and appropriate blood return was obtained.  Each lumen of the catheter was evacuated of air and flushed with sterile saline.  The catheter was then sutured in place to the skin and a sterile dressing applied.  Perfusion to the extremity distal to the point of catheter insertion was checked and found to be adequate.     The patient tolerated the procedure well and there were no complications.  Stephanie AcreVishal Kallum Jorgensen, MD [facility] Pager [phone] (please enter 7-digits) On Call Pager - [phone] (please enter 7-digits)

## 2016-01-02 NOTE — Progress Notes (Signed)
Patient having periods of apnea and not arousable after being shocked by AICD therefore Dr. Dema SeverinMungal gave order for intubation and order for fentanyl, versed and vecuronium for intubation meds.  Amy RRT at bedside.

## 2016-01-02 NOTE — Progress Notes (Signed)
Chaplain rounded the unit to provide a compassionate presence and support to the patient. Chaplain provided spiritual in the form of prayer.  Jefm PettyChaplain Telvin Reinders [phone]

## 2016-01-02 NOTE — Consult Note (Signed)
PULMONARY / CRITICAL CARE MEDICINE   Name: Nicholas Leonard MRN: [id number] DOB: 12/22/1945    ADMISSION DATE:  2016/01/08   CONSULTATION DATE: 01/02/2016  REFERRING MD: Dr. Juliene Pina  REASON FOR CONSULTATION:  Ventricular tachycardia with multiple AICD discharges, hypotension and LOC  HISTORY OF PRESENT ILLNESS:   This is a 70 year old Caucasian male who was initially admitted with rectal bleeding and hematemesis. He was being treated for an acute GI bleed on anticoagulation (Coumadin for DVT and atrial fibrillation). He was started on  Protonix and octreotide infusions and admitted to the ICU. Upon admission, his troponin was elevated and it was deemed that his elevated troponin was due to hypotension. He was continued on amiodarone and Coumadin for atrial fibrillation and DVT. He was also being followed by nephrology for a creatinine of 5.3 and a BUN of 157. This morning, patient started having short runs of V. tach. It was deemed that this was due to his low potassium. His potassium was replaced and he was loaded with amiodarone. Later this afternoon. Patient started having sustained ventricular tachycardia and his AICD fired multiple times. He became hypotensive and obtunded. Hence, he was emergently intubated. 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  PAST SURGICAL  HISTORY: He  has a past surgical history that includes FISTULAGRAM ([facility] HX) (07/02/2014); IVC filter; Insert / replace / remove pacemaker; Coronary angioplasty; Cardiac catheterization (Right, 09/03/2014); and Cardiac catheterization (Right, 09/03/2014).  Allergies  Allergen Reactions  . Niaspan [Niacin Er] Other (See Comments)    Reaction:  Unknown   . Tape Rash    No current facility-administered medications on file prior to encounter.    Current Outpatient Prescriptions on File Prior to Encounter  Medication Sig  . allopurinol (ZYLOPRIM) 100 MG tablet Take 200 mg by mouth every morning.   Marland Kitchen amiodarone (PACERONE) 200 MG tablet Take 200 mg by mouth every morning.   Marland Kitchen atorvastatin (LIPITOR) 80 MG tablet Take 80 mg by mouth every evening.   Marland Kitchen levothyroxine (SYNTHROID, LEVOTHROID) 150 MCG tablet Take 150 mcg by mouth daily before breakfast.  . metoprolol tartrate (LOPRESSOR) 25 MG tablet Take 1 tablet (25 mg total) by mouth 2 (two) times daily.  . potassium chloride SA (K-DUR,KLOR-CON) 20 MEQ tablet Take 20 mEq by mouth every morning.   . insulin glargine (LANTUS) 100 UNIT/ML injection Inject 0.4 mLs (40 Units total) into the skin daily.    FAMILY HISTORY:  His indicated that the status of his mother is unknown. He indicated that the status of his father is unknown. He indicated that the status of his paternal grandmother is unknown. He indicated that the status of his paternal grandfather is unknown.    SOCIAL HISTORY: He  reports that he quit smoking about 33 years ago. He has never used smokeless tobacco. He reports that he does not drink alcohol or use drugs.  REVIEW OF SYSTEMS:   Able to obtain as patient is currently sedated and intubated  SUBJECTIVE:   VITAL SIGNS: BP Marland Kitchen)  79/63   Pulse 85   Temp 98.8 F (37.1 C) (Axillary)   Resp 15   Ht 6' (1.829 m)   Wt 203 lb 14.8 oz (92.5 kg)   SpO2 100%   BMI 27.66 kg/m   HEMODYNAMICS:    VENTILATOR SETTINGS: Vent Mode:  PRVC FiO2 (%):  [50 %-100 %] 50 % Set Rate:  [14 bmp-20 bmp] 20 bmp Vt Set:  [450 mL] 450 mL PEEP:  [8 cmH20] 8 cmH20  INTAKE / OUTPUT: I/O last 3 completed shifts: In: 7313.9 [P.O.:480; I.V.:4335.6; Blood:600; IV Piggyback:1898.3] Out: 2400 [Urine:2400]  PHYSICAL EXAMINATION: General: No acute distress, sedated Neuro: Exam limited due to sedation. Withdraws to emotional stimulus, positive gag reflex HEENT: The more cephalic and atraumatic, EMS, passages patent, oral mucosa with moderate secretions, pink, trachea midline, ET tube in place Cardiovascular: Apical pulse 86, regular, S1, S2 audible, no murmurs or thrills Lungs:  Bilateral airflow, breath sounds diminished in the bases, no wheezing Abdomen:  Nondistended, normal bowel sounds, palpation reveals no organomegaly Musculoskeletal:  No visible deformities. Extremities: Venostasis discoloration in lower extremities, +2 pulses, no edema Skin:  Her mother and dry  LABS:  BMET  Recent Labs Lab 01/02/16 0132 01/02/16 0750 01/02/16 1140 01/02/16 1705  NA 134* 135  --  134*  K 3.1* 2.9* 2.7* 3.6  CL 91* 94*  --  97*  CO2 28 25  --  22  BUN 137* 126*  --  113*  CREATININE 4.12* 3.71*  --  3.41*  GLUCOSE 132* 206*  --  285*    Electrolytes  Recent Labs Lab 01/02/16 0132 01/02/16 0147 01/02/16 0749 01/02/16 0750 01/02/16 1140 01/02/16 1705  CALCIUM 8.3*  --   --  9.1  --  8.5*  MG  --  2.0 2.5*  --   --  2.2  PHOS 4.2  --   --   --  2.9  --     CBC  Recent Labs Lab 09/04/15 1400 09/04/15 1617 01/02/16 0132 01/02/16 0750  WBC 23.4*  --  10.8* 9.2  HGB 11.5* 11.9* 11.3*  11.4* 10.9*  HCT 34.2* 34.2* 32.7*  33.0* 31.1*  PLT 196  --  137* 116*    Coag's  Recent Labs Lab 09/04/15 1400  01/02/16 0749 01/02/16 1441 01/02/16 2029  APTT 89*  --   --   --   --   INR >11.00*  < > 1.09 1.10 1.16  < > = values in this interval not displayed.  Sepsis Markers No results for input(s): LATICACIDVEN,  PROCALCITON, O2SATVEN in the last 168 hours.  ABG  Recent Labs Lab 01/02/16 1930  PHART 7.22*  PCO2ART 60*  PO2ART 290*    Liver Enzymes  Recent Labs Lab 09/04/15 1400 01/02/16 0750  AST 125* 129*  ALT 103* 102*  ALKPHOS 85 69  BILITOT 1.2 2.1*  ALBUMIN 3.9 2.9*    Cardiac Enzymes  Recent Labs Lab 09/04/15 1400 09/04/15 1617  TROPONINI 0.16* 0.13*    Glucose  Recent Labs Lab 09/04/15 1616 09/04/15 2129 01/02/16 0737 01/02/16 1120 01/02/16 1612 01/02/16 1945  GLUCAP 204* 161* 193* 218* 230* 335*    Imaging [place] Renal  Result Date: 01/02/2016 CLINICAL DATA:  Acute renal failure EXAM: RENAL / URINARY TRACT ULTRASOUND COMPLETE COMPARISON:  None. FINDINGS: Right Kidney: Length: 10.2 cm. Echogenicity within normal limits. No mass or hydronephrosis visualized. Left Kidney: Length: 10.0 cm. Echogenicity within normal limits. No mass or hydronephrosis visualized. Bladder: Decompressed with  Foley catheter in place, not well visualized. IMPRESSION: No acute findings.  No hydronephrosis. Electronically Signed   By: Charlett Nose M.D.   On: 01/02/2016 11:12   US Abdomen Limited Ruq  Result Date: 01/02/2016 CLINICAL DATA:  Abnormal LFTs EXAM: US ABDOMEN LIMITED - RIGHT UPPER QUADRANT COMPARISON:  None. FINDINGS: Gallbladder: Gallbladder is mildly distended. Mild gallbladder wall thickening at 6 mm. No visible stones. Small amount sludge in the gallbladder. Negative sonographic Murphy's. Common bile duct: Diameter: Normal caliber, 4 mm Liver: Mildly increased echotexture suggesting fatty infiltration. No biliary ductal dilatation or focal lesion. IMPRESSION: Increased echotexture in the liver suggesting fatty infiltration. Gallbladder is mildly distended with small amount of sludge. Mild gallbladder wall thickening. Cannot completely exclude acalculous cholecystitis. If there is clinical concern, consider nuclear medicine hepatobiliary scan. Electronically Signed   By: Charlett Nose M.D.   On: 01/02/2016 11:12    STUDIES:  01/02/2016 2-D echo on that  CULTURES: MRSA screen negative  ANTIBIOTICS: None  SIGNIFICANT EVENTS: 01/02/2016: Admitted with acute GI bleed 01/02/2016: Ventricular tachycardia with multiple AICD charges, became obtunded, hypotensive and intubated  LINES/TUBES: Right femoral CVL placed on 01/02/2016 ET tube placed 01/02/2016  DISCUSSION: 70 year old Caucasian male with multiple comorbidities presenting with wide-complex and jugular tachycardia, hypotension, acute respiratory failure due to loss of consciousness, and acute GI bleed on anticoagulation.  ASSESSMENT / PLAN:  PULMONARY A:  Acute respiratory failure secondary to loss of consciousness and ventricular tachycardia with hypotension. P:   Full vent support with current settings Repeat ABG at 2300 Chest x-ray in the morning VAP Protocol Nebulized bronchodilators  CARDIOVASCULAR A:  Wide-complex ventricular tachycardia. Hypotension History of ischemic cardiomyopathy, status post AICD placement Chronic systolic congestive heart failure. History of MI History of atrial fibrillation P:  Cardiology consulted; appreciate recommendations. Continue lidocaine infusion per cardiology Hemodynamic monitoring per ICU protocol 2-D echo in the morning discontinue amiodarone per cardiology Continue Lipitor 80 mg daily Discontinue anticoagulation in light of new GI bleed Monitor and aggressively replace electrolytes.; Maintain potassium 4 and a bowl of and magnesium 2.5 and above  RENAL A:   Acute renal failure-his baseline creatinine is 1.9, creatinine rose to 5.3 on admission and currently down to 3.4.; Likely acute tubular necrosis secondary to volume depletion Hypokalemia P:   Continue volume replacement. Monitor and replace electrolytes Renally dose medications. Trend creatinine Nephrology following  GASTROINTESTINAL A:   Acute GI bleed on  anticoagulation Elevated LFTs. P:   GI following; appreciate recommendations. Continue octreotide and Protonix infusions per GI monitor for bleeding and trend hemoglobin and hematocrit Trend LFTs  HEMATOLOGIC A:   Acute blood loss anemia-hemoglobin stable at 10.9 History of DVT P:  Trend hemoglobin and transfuse if hemoglobin less than 7 Hold all anticoagulation  ENDOCRINE A:   History of type 2 diabetes History of hyperthyroidism P:   Blood glucose monitoring with sliding scale insulin coverage sliding  continue home dose of Synthroid Check TSH with a.m. labs  NEUROLOGIC A:   Acute loss of consciousness secondary to hypotension and arrhythmia. History of peripheral neuropathy secondary to diabetes  P:   RASS goal: -1 to -2 Fentanyl and Versed for vent sedation and comfort. Monitor neurological status closely   Disposition and family update: No family at bedside. Will update when available.   Magdalene S. Tukov ANP-BC Pulmonary and Critical Care Medicine [facility] Pager [phone] or [phone]  STAFF NOTE: I, Dr. Stephanie Acre have personally reviewed patient's available data, including medical history, events  of note, physical examination and test results as part of my evaluation. I have discussed with NP Karin Golden  and other care providers such as pharmacist, RN and RRT.  70 yo Male past mediry of colonies, AICD, atrial fibrillation, DVT, ischemic cardiopathy myopathy with ejection fraction of 25%, diabetes who initially presented from jail with rectal bleeding and vomiting blood, had no episodes of that during this hospitalization. This morning noted to have significant runs of ventricular tachycardia requiring amiodarone bolus and lidocainebolus with inthis afternoo multiple run ventricular tachycardia not responsive to amiodaror lidocaine drip and noted to have firing of his AICD on multiple attempts, patient eventually became hypotensive  and obtunded and was intubated due to decreased mentation and inability to protect airway. During his runs of ventricular tachycardia he did have hypotension but did not have any significant desaturations.   A:70 year old male withHe said rectal bleeding secondary to Coumadin,history of AICD placement, now with multiple episodes of ventricular tachyarrhythmia responsive to amiodarone and multiple Discharges of his AICD, decreased mentation and inability protect airway  Ventricular tachycardia Acute hypercarbic respiratory failure AICD firing Recent GI bleed Increased Liver enzymes Supratherapeutic INR-resolved With reversal Acute on chronic kidney disease stage III Ischemic cardiomyopathy with ejection fraction of 25% Elevated troponin History of atrial fibrillation on anticoagulation Diabetes  P: Patient with multiple Events of AICD firing, initially got 1 bolus of amiodarone this evening, Followed by initiation of lidocaine drip. He continued to have ventricular tachycardia, at which point one nulligravida magnesium was givenand his rhythm broken. Cardiology followed up-continue lidocaine drip and give magnesium if needed for further episodes of V. Tach Monitor INR ICU hypoglycemia and hyperglycemia protocol Wean mechanical ventilation as tolerated Maintain O2 saturation greater than 88%    . Rest per NP/medical resident whose note is outlined above and that I agree with  The patient is critically ill with multiple organ systems failure and requires high complexity decision making for assessment and support, frequent evaluation and titration of therapies, application of advanced monitoring technologies and extensive interpretation of multiple databases.   Critical Care Time devoted to patient care services described in this note is 45 Minutes.   This time reflects time of care of this signee Dr Stephanie Acre.  This critical care time does not reflect procedure time, or  teaching time or supervisory time of PA/NP/Med-student/Med Resident etc but could involve care discussion time.  Stephanie Acre, MD [place] [facility] Pager [phone] (please enter 7-digits) On Call Pager - [phone] (please enter 7-digits)  Note: This note was prepared with Dragon dictation along with smaller phrase technology. Any transcriptional errors that result from this process are unintentional

## 2016-01-02 NOTE — Progress Notes (Signed)
Dr. Dema SeverinMungal spoke with Dr. Gwen PoundsKowalski at 1806 and Dr. Gwen PoundsKowalski is aware that patient is back in vtach and has been shocked by his AICD twice. Dr. Gwen PoundsKowalski coming in to see patient.

## 2016-01-02 NOTE — Progress Notes (Addendum)
Inpatient Diabetes Program Recommendations  AACE/ADA: New Consensus Statement on Inpatient Glycemic Control (2015)  Target Ranges:  Prepandial:   less than 140 mg/dL      Peak postprandial:   less than 180 mg/dL (1-2 hours)      Critically ill patients:  140 - 180 mg/dL   Lab Results  Component Value Date   GLUCAP 193 (H) 01/02/2016   HGBA1C 8.4 (H) 06-03-15    Review of Glycemic Control:  Results for Nicholas DesanctisRKER, Nicholas Leonard (MRN [id number]) as of 01/02/2016 09:22  Ref. Range Aug 19, 2015 14:05 Aug 19, 2015 16:16 Aug 19, 2015 21:29 01/02/2016 07:37  Glucose-Capillary Latest Ref Range: 65 - 99 mg/dL 841179 (H) 324204 (H) 401161 (H) 193 (H)   Diabetes history: Type 2 diabetes Outpatient Diabetes medications: Lantus 40 units daily Current orders for Inpatient glycemic control:  Novolog sensitive tid with meals  Inpatient Diabetes Program Recommendations:   Referral received and chart reviewed.  Note that patient is currently NPO.  May consider increasing frequency of Novolog correction to q 4 hours.  If blood sugars continue to be greater than 180 mg/dL, consider adding Lantus 10 units daily.   Thanks, Beryl MeagerJenny Quaron Delacruz, RN, BC-ADM Inpatient Diabetes Coordinator Pager [phone] (8a-5p)
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
## 2016-01-02 NOTE — Progress Notes (Signed)
Pt went into sustained Vtach. Dr. Sheryle Hailiamond to bedside, defib patches placed. Given Amiodarone, lidocaine, mag, potassium, calcium (see MAR) Now improved, resting.

## 2016-01-02 NOTE — Progress Notes (Signed)
Patient intubated at 1825 by Dr. Dema SeverinMungal with help from Amy, RRT.  Now setting up for central line placement.

## 2016-01-02 NOTE — Progress Notes (Signed)
Subjective:   Patient known to our practice from outpatient He is followed by Dr. Cherylann Ratel Last seen in January 2017 Baseline creatinine of 1.9/GFR 35 on 10/20/2015  Patient was brought to the emergency room by the sheriff's office for bleeding per rectum and coughing of blood which had been going on for about 5 days. Patient states that his appetite was poor but he was trying to drink more fluids. He was able to urinate without problem. Denies any blood in the urine.  In the emergency room, his creatinine was noted to be 5.27, BUN 157, potassium 2.9 With intravenous hydration, his creatinine has improved to 3.71 and BUN is 126 today INR was greater than 11 at the time of presentation. It is corrected now.  Objective:  Vital signs in last 24 hours:  Temp:  [97.6 F (36.4 C)-100.1 F (37.8 C)] 100.1 F (37.8 C) (09/22 1200) Pulse Rate:  [40-99] 84 (09/22 1300) Resp:  [12-25] 21 (09/22 1300) BP: (63-196)/(38-186) 82/58 (09/22 1300) SpO2:  [71 %-100 %] 100 % (09/22 1300) Weight:  [92.5 kg (203 lb 14.8 oz)-95.7 kg (211 lb)] 92.5 kg (203 lb 14.8 oz) (09/21 1618)  Weight change:  Filed Weights   01/10/2016 1403 01/08/2016 1618  Weight: 95.7 kg (211 lb) 92.5 kg (203 lb 14.8 oz)    Intake/Output:    Intake/Output Summary (Last 24 hours) at 01/02/16 1343 Last data filed at 01/02/16 1200  Gross per 24 hour  Intake          4773.71 ml  Output             1500 ml  Net          3273.71 ml     Physical Exam: General: Chronically ill-appearing, lying in the bed   HEENT Somewhat dry oral mucous membranes   Neck Supple, no masses   Pulm/lungs Coarse breath sounds, normal breathing effort   CVS/Heart Tachycardic, irregular, no rub   Abdomen:  Soft, nontender, nondistended   Extremities: ++ dependent edema  Neurologic: Alert, oriented  Skin: No acute rashes   Foley in place       Basic Metabolic Panel:   Recent Labs Lab 12/25/2015 1400 12/31/2015 1617 01/02/16 0132 01/02/16 0147  01/02/16 0749 01/02/16 0750 01/02/16 1140  NA 132*  --  134*  --   --  135  --   K 2.9*  --  3.1*  --   --  2.9* 2.7*  CL 80*  --  91*  --   --  94*  --   CO2 30  --  28  --   --  25  --   GLUCOSE 192*  --  132*  --   --  206*  --   BUN 157*  --  137*  --   --  126*  --   CREATININE 5.27*  --  4.12*  --   --  3.71*  --   CALCIUM 9.6  --  8.3*  --   --  9.1  --   MG  --  2.2  --  2.0 2.5*  --   --   PHOS  --   --  4.2  --   --   --  2.9     CBC:  Recent Labs Lab 12/13/2015 1400 12/26/2015 1617 01/02/16 0132 01/02/16 0750  WBC 23.4*  --  10.8* 9.2  HGB 11.5* 11.9* 11.3*  11.4* 10.9*  HCT 34.2* 34.2* 32.7*  33.0* 31.1*  MCV 88.8  --  88.8 88.4  PLT 196  --  137* 116*      Microbiology:  Recent Results (from the past 720 hour(s))  MRSA PCR Screening     Status: None   Collection Time: 06-Aug-2015  4:10 PM  Result Value Ref Range Status   MRSA by PCR NEGATIVE NEGATIVE Final    Comment:        The GeneXpert MRSA Assay (FDA approved for NASAL specimens only), is one component of a comprehensive MRSA colonization surveillance program. It is not intended to diagnose MRSA infection nor to guide or monitor treatment for MRSA infections.     Coagulation Studies:  Recent Labs  06-Aug-2015 1400 06-Aug-2015 2006 01/02/16 0132 01/02/16 0749  LABPROT >90.0* 14.5 13.9 14.1  INR >11.00* 1.12 1.07 1.09    Urinalysis: No results for input(s): COLORURINE, LABSPEC, PHURINE, GLUCOSEU, HGBUR, BILIRUBINUR, KETONESUR, PROTEINUR, UROBILINOGEN, NITRITE, LEUKOCYTESUR in the last 72 hours.  Invalid input(s): APPERANCEUR    Imaging: Dg Chest 1 View  Result Date: 12-30-2015 CLINICAL DATA:  Rectal bleeding for 5 days, dizziness, weakness EXAM: CHEST 1 VIEW COMPARISON:  06/25/2014 FINDINGS: Cardiomediastinal silhouette is stable. Elevation of the right hemidiaphragm again noted. Status post CABG. Single lead cardiac pacemaker is unchanged in position. No infiltrate or pulmonary edema.  Stable scarring or atelectasis in lingula. IMPRESSION: No active disease. Electronically Signed   By: Natasha MeadLiviu  Pop M.D.   On: 009-19-2017 15:52   [place] Renal  Result Date: 01/02/2016 CLINICAL DATA:  Acute renal failure EXAM: RENAL / URINARY TRACT ULTRASOUND COMPLETE COMPARISON:  None. FINDINGS: Right Kidney: Length: 10.2 cm. Echogenicity within normal limits. No mass or hydronephrosis visualized. Left Kidney: Length: 10.0 cm. Echogenicity within normal limits. No mass or hydronephrosis visualized. Bladder: Decompressed with Foley catheter in place, not well visualized. IMPRESSION: No acute findings.  No hydronephrosis. Electronically Signed   By: Charlett NoseKevin  Dover M.D.   On: 01/02/2016 11:12   [place] Abdomen Limited Ruq  Result Date: 01/02/2016 CLINICAL DATA:  Abnormal LFTs EXAM: US ABDOMEN LIMITED - RIGHT UPPER QUADRANT COMPARISON:  None. FINDINGS: Gallbladder: Gallbladder is mildly distended. Mild gallbladder wall thickening at 6 mm. No visible stones. Small amount sludge in the gallbladder. Negative sonographic Murphy's. Common bile duct: Diameter: Normal caliber, 4 mm Liver: Mildly increased echotexture suggesting fatty infiltration. No biliary ductal dilatation or focal lesion. IMPRESSION: Increased echotexture in the liver suggesting fatty infiltration. Gallbladder is mildly distended with small amount of sludge. Mild gallbladder wall thickening. Cannot completely exclude acalculous cholecystitis. If there is clinical concern, consider nuclear medicine hepatobiliary scan. Electronically Signed   By: Charlett NoseKevin  Dover M.D.   On: 01/02/2016 11:12     Medications:   . sodium chloride 100 mL/hr at 01/02/16 1113  . amiodarone 30 mg/hr (01/02/16 1126)  . octreotide  (SANDOSTATIN)    IV infusion 50 mcg/hr (01/02/16 1210)  . pantoprozole (PROTONIX) infusion 8 mg/hr (01/02/16 0319)   . allopurinol  200 mg Oral BH-q7a  . atorvastatin  80 mg Oral QPM  . chlorhexidine  15 mL Mouth Rinse BID  . [START ON 01/02/2016]  Influenza vac split quadrivalent PF  0.5 mL Intramuscular Tomorrow-1000  . insulin aspart  0-9 Units Subcutaneous TID WC  . levothyroxine  150 mcg Oral QAC breakfast  . mouth rinse  15 mL Mouth Rinse q12n4p  . midazolam      . midazolam  4 mg Intravenous Once  . [START ON 01/05/2016] pantoprazole  40 mg  Intravenous Q12H  . [START ON 12/23/2015] pneumococcal 23 valent vaccine  0.5 mL Intramuscular Tomorrow-1000  . potassium chloride  10 mEq Intravenous Q1 Hr x 6  . sodium chloride flush  3 mL Intravenous Q12H   acetaminophen **OR** acetaminophen, ondansetron **OR** ondansetron (ZOFRAN) IV, senna-docusate  Assessment/ Plan:  70 y.o. male with coronary artery disease status post 4 vessel CABG, AICD placement, chronic systolic heart failure, history of IVC filter placement, hypertension, hypothyroidism, diabetes mellitus type 2, history of PE/DVT, CKD stage III  1. Severe acute renal failure - Likely secondary to prerenal state leading to severe ATN - With IV hydration, his BUN and creatinine are improving and urine output has improved - Electrolytes and volume status are acceptable. No acute indication for dialysis  2. Hypokalemia - Agree with IV/oral replacement - magnesium lesion level is normal  3. CKD st 3 - Underlying chronic kidney disease appears to be related to atherosclerosis    LOS: 1 Nicholas Leonard 9/22/20171:43 PM

## 2016-01-02 NOTE — Plan of Care (Signed)
Called pharmacy for orders for Mg stat

## 2016-01-02 NOTE — Consult Note (Signed)
[facility] Cardiology Consultation Note  Patient ID: Nicholas Leonard, MRN: [id number], DOB/AGE: 1945-09-06 70 y.o. Admit date: 03-Jan-2016   Date of Consult: 01/02/2016 Primary Physician: Lynnea Ferrier, MD Primary Cardiologist: None  Chief Complaint:  Chief Complaint  Patient presents with  . Rectal Bleeding   Reason for Consult: wide complex tachycardia  HPI: 70 y.o. male with known coronary artery disease status post coronary artery bypass graft in 2015 with previous myocardial infarction and ischemic cardiomyopathy who has been on appropriate medication management for ischemic cardiomyopathy and is status post a defibrillator placement. The patient has done very well with cardiac standpoint with mild shortness of breath with physical activity unchanged in recent past true angina and/or no evidence of exacerbation of congestive heart failure. The patient also has been on appropriate medication management including warfarin for previous history of deep venous thrombosis for which she is been recently incarcerated unknown dose. At this time the patient has had significant bleeding in the rectal bleeding as well as hematemesis for which has caused a significant distress including hypotension and anemia. At that time he was seen in the emergency room and found that as INR was greater than 11. Patient was treated with appropriate medication management and has improved. At that time he also had tachycardia with wide complex EKG which in comparison to his EKG in early 2017 is unchanged. His wide-complex was a slow tachycardia which did not require defibrillation by his defibrillator. After further fluid resuscitation the patient has had some improvements. He also was receiving amiodarone as well as cane for his wide complex tachycardia for which she is now had a heart rate of 90 bpm. There has been no evidence of distress with this rhythm disturbance. Troponin as 0.16 consistent with demand  ischemia and not consistent with acute coronary syndrome. Other issues include severe chronic kidney disease and anemia  Past Medical History:  Diagnosis Date  . AICD (automatic cardioverter/defibrillator) present   . Atrial fibrillation (HCC)   . CHF (congestive heart failure) (HCC)    chronic left-sided systolic  . Chronic kidney disease   . Decubitus ulcer on ankle   . Diabetes mellitus without complication (HCC)   . DVT (deep venous thrombosis) (HCC)   . DVT (deep venous thrombosis) (HCC)   . Gout   . Hyperthyroidism   . Ischemic cardiomyopathy   . Myocardial infarction (HCC)   . Neuropathy (HCC)   . Osteoarthritis   . 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 Shortness of breath dyspnea   . Thyroid disease    hypothyroidism  . Venous stasis ulcer (HCC)   . Ventricular fibrillation [facility])       Surgical History:  Past Surgical History:  Procedure Laterality Date  . CORONARY ANGIOPLASTY    . FISTULAGRAM ([facility] HX)  07/02/2014  . INSERT / REPLACE / REMOVE PACEMAKER    . IVC filter    . PERIPHERAL VASCULAR CATHETERIZATION Right 09/03/2014   Procedure: Lower Extremity Angiography;  Surgeon: Renford Dills, MD;  Location: [facility] INVASIVE CV LAB;  Service: Cardiovascular;  Laterality: Right;  . PERIPHERAL VASCULAR CATHETERIZATION Right 09/03/2014   Procedure: Lower Extremity Intervention;  Surgeon: Renford Dills, MD;  Location: [facility] INVASIVE CV LAB;  Service: Cardiovascular;  Laterality: Right;     Home Meds: Prior to Admission medications   Medication Sig Start Date End Date Taking? Authorizing Provider  allopurinol (ZYLOPRIM) 100 MG tablet Take 200 mg by mouth  every morning.    Yes Historical Provider, MD  amiodarone (PACERONE) 200 MG tablet Take 200 mg by mouth every morning.    Yes Historical Provider, MD  atorvastatin (LIPITOR) 80 MG tablet Take 80 mg by mouth every evening.    Yes Historical Provider, MD  furosemide (LASIX) 40 MG tablet  Take 40 mg by mouth 2 (two) times daily.   Yes Historical Provider, MD  hydrochlorothiazide (HYDRODIURIL) 25 MG tablet Take 25 mg by mouth every morning.   Yes Historical Provider, MD  levothyroxine (SYNTHROID, LEVOTHROID) 150 MCG tablet Take 150 mcg by mouth daily before breakfast.   Yes Historical Provider, MD  metoprolol tartrate (LOPRESSOR) 25 MG tablet Take 1 tablet (25 mg total) by mouth 2 (two) times daily. 06/05/15  Yes Katha Hamming, MD  potassium chloride SA (K-DUR,KLOR-CON) 20 MEQ tablet Take 20 mEq by mouth every morning.    Yes Historical Provider, MD  insulin glargine (LANTUS) 100 UNIT/ML injection Inject 0.4 mLs (40 Units total) into the skin daily. 06/05/15   Katha Hamming, MD    Inpatient Medications:  . allopurinol  200 mg Oral BH-q7a  . atorvastatin  80 mg Oral QPM  . insulin aspart  0-9 Units Subcutaneous TID WC  . levothyroxine  150 mcg Oral QAC breakfast  . midazolam      . midazolam  4 mg Intravenous Once  . [START ON 01/05/2016] pantoprazole  40 mg Intravenous Q12H  . potassium chloride  10 mEq Intravenous Q1 Hr x 4  . sodium chloride flush  3 mL Intravenous Q12H   . sodium chloride    . amiodarone 60 mg/hr (01/02/16 0544)   Followed by  . amiodarone    . DOPamine Stopped (01/02/16 0532)  . lidocaine 2 mg/min (01/02/16 4098)  . octreotide  (SANDOSTATIN)    IV infusion 50 mcg/hr (01/02/16 0231)  . pantoprozole (PROTONIX) infusion 8 mg/hr (01/02/16 0319)    Allergies:  Allergies  Allergen Reactions  . Niaspan [Niacin Er] Other (See Comments)    Reaction:  Unknown   . Tape Rash    Social History   Social History  . Marital status: Married    Spouse name: N/A  . Number of children: N/A  . Years of education: N/A   Occupational History  . Not on file.   Social History Main Topics  . Smoking status: Former Smoker    Quit date: 07/17/1982  . Smokeless tobacco: Not on file  . Alcohol use No  . Drug use: No  . Sexual activity: Not on file    Other Topics Concern  . Not on file   Social History Narrative  . No narrative on file     Family History  Problem Relation Age of Onset  . Heart disease Mother   . Hypertension Mother   . Heart disease Father   . Hypertension Father   . Heart disease Paternal Grandmother   . Hypertension Paternal Grandmother   . Stroke Paternal Grandmother   . Heart disease Paternal Grandfather   . Hypertension Paternal Grandfather   . Stroke Paternal Grandfather      Review of Systems Positive for Bleeding bruit easing bright red blood Negative for: General:  chills, fever, night sweats or weight changes.  Cardiovascular: PND orthopnea syncope dizziness  Dermatological skin lesions rashes Respiratory: Cough congestion Urologic: Frequent urination urination at night and hematuria Abdominal: negative for nausea, vomiting, diarrhea, positive for bright red blood per rectum, melena, or hematemesis Neurologic: negative for  visual changes, and/or hearing changes  All other systems reviewed and are otherwise negative except as noted above.  Labs:  Recent Labs  12-16-15 1400 12-16-15 1617  TROPONINI 0.16* 0.13*   Lab Results  Component Value Date   WBC 9.2 01/02/2016   HGB 10.9 (L) 01/02/2016   HCT 31.1 (L) 01/02/2016   MCV 88.4 01/02/2016   PLT 116 (L) 01/02/2016    Recent Labs Lab 12-16-15 1400 01/02/16 0132  NA 132* 134*  K 2.9* 3.1*  CL 80* 91*  CO2 30 28  BUN 157* 137*  CREATININE 5.27* 4.12*  CALCIUM 9.6 8.3*  PROT 7.9  --   BILITOT 1.2  --   ALKPHOS 85  --   ALT 103*  --   AST 125*  --   GLUCOSE 192* 132*   No results found for: CHOL, HDL, LDLCALC, TRIG No results found for: DDIMER  Radiology/Studies:  Dg Chest 1 View  Result Date: 06/17/15 CLINICAL DATA:  Rectal bleeding for 5 days, dizziness, weakness EXAM: CHEST 1 VIEW COMPARISON:  06/25/2014 FINDINGS: Cardiomediastinal silhouette is stable. Elevation of the right hemidiaphragm again noted. Status  post CABG. Single lead cardiac pacemaker is unchanged in position. No infiltrate or pulmonary edema. Stable scarring or atelectasis in lingula. IMPRESSION: No active disease. Electronically Signed   By: Natasha MeadLiviu  Pop M.D.   On: 003/07/17 15:52    EKG: Normal sinus rhythm with bundle branch block  Weights: Filed Weights   12-16-15 1403 12-16-15 1618  Weight: 211 lb (95.7 kg) 203 lb 14.8 oz (92.5 kg)     Physical Exam: Blood pressure (!) 90/56, pulse 91, temperature 98.3 F (36.8 C), resp. rate 15, height 6' (1.829 m), weight 203 lb 14.8 oz (92.5 kg), SpO2 100 %. Body mass index is 27.66 kg/m. General: Well developed, well nourished, in no acute distress. Head eyes ears nose throat: Normocephalic, atraumatic, sclera non-icteric, no xanthomas, nares are without discharge. No apparent thyromegaly and/or mass  Lungs: Normal respiratory effort.  Few wheezes, basilar rales, no rhonchi.  Heart: Irregular with normal S1 S2. no murmur gallop, no rub, PMI is normal size and placement, carotid upstroke normal without bruit, jugular venous pressure is normal Abdomen: Soft, non-tender, non-distended with normoactive bowel sounds. No hepatomegaly. No rebound/guarding. No obvious abdominal masses. Abdominal aorta is normal size without bruit Extremities: Trace edema. no cyanosis, no clubbing, no ulcers  Peripheral : 2+ bilateral upper extremity pulses, 2+ bilateral femoral pulses, 2+ bilateral dorsal pedal pulse Neuro: Alert and oriented. No facial asymmetry. No focal deficit. Moves all extremities spontaneously. Musculoskeletal: Normal muscle tone without kyphosis Psych:  Responds to questions appropriately with a normal affect.    Assessment: 70 year old male with known coronary artery disease status post previous coronary artery bypass graft and cardiomyopathy with chronic systolic dysfunction heart failure having significant GI bleed and exacerbation of chronic kidney disease causing tachycardia with  wide complex currently not consistent with ventricular tachycardia not requiring defibrillation and responding to other medication management with heart rate control  Plan: 1. Continue amiodarone and switch to oral amiodarone for heart rate control and follow for improvements and possible discontinuation when patient fully recovered from bleeding 2. Echocardiogram for extent of LV systolic dysfunction valvular heart disease contributing to above 3. Discontinuation of lidocaine due to heart rate control and no evidence of ventricular tachycardia 4. Further workup of GI bleed although majority of issue likely secondary to high INR 5. Discontinuation of the main as able when patient hemodynamically improved  after bleeding and hematochezia 6. Begin ambulation after above and follow for further recovery  Signed, Lamar Blinks M.D. [facility] Cardiology 01/02/2016, 8:35 AM

## 2016-01-02 NOTE — Progress Notes (Signed)
ANTICOAGULATION CONSULT NOTE - Initial Consult  Pharmacy Consult for [facility]  Indication: supra therapeutic INR   Allergies  Allergen Reactions  . Niaspan [Niacin Er] Other (See Comments)    Reaction:  Unknown   . Tape Rash    Patient Measurements: Height: 6' (182.9 cm) Weight: 203 lb 14.8 oz (92.5 kg) IBW/kg (Calculated) : 77.6 Heparin Dosing Weight:   Vital Signs: Temp: 100.1 F (37.8 C) (09/22 1200) Temp Source: Axillary (09/22 1200) BP: 69/53 (09/22 1835) Pulse Rate: 86 (09/22 1835)  Labs:  Recent Labs  12/12/2015 1400 01/02/2016 1617  01/02/16 0132 01/02/16 0749 01/02/16 0750 01/02/16 1441 01/02/16 1705  HGB 11.5* 11.9*  --  11.3*  11.4*  --  10.9*  --   --   HCT 34.2* 34.2*  --  32.7*  33.0*  --  31.1*  --   --   PLT 196  --   --  137*  --  116*  --   --   APTT 89*  --   --   --   --   --   --   --   LABPROT >90.0*  --   < > 13.9 14.1  --  14.2  --   INR >11.00*  --   < > 1.07 1.09  --  1.10  --   CREATININE 5.27*  --   --  4.12*  --  3.71*  --  3.41*  TROPONINI 0.16* 0.13*  --   --   --   --   --   --   < > = values in this interval not displayed.  Estimated Creatinine Clearance: 22.1 mL/min (by C-G formula based on SCr of 3.41 mg/dL (H)).   Medical History: Past Medical History:  Diagnosis Date  . AICD (automatic cardioverter/defibrillator) present   . Atrial fibrillation (HCC)   . CHF (congestive heart failure) (HCC)    chronic left-sided systolic  . Chronic kidney disease   . Decubitus ulcer on ankle   . Diabetes mellitus without complication (HCC)   . DVT (deep venous thrombosis) (HCC)   . DVT (deep venous thrombosis) (HCC)   . Gout   . Hyperthyroidism   . Ischemic cardiomyopathy   . Myocardial infarction (HCC)   . Neuropathy (HCC)   . Osteoarthritis   . 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 Shortness of breath dyspnea   . Thyroid disease    hypothyroidism  . Venous stasis ulcer (HCC)   . Ventricular  fibrillation (HCC)     Medications:  Prescriptions Prior to Admission  Medication Sig Dispense Refill Last Dose  . allopurinol (ZYLOPRIM) 100 MG tablet Take 200 mg by mouth every morning.    12/24/2015 at Unknown time  . amiodarone (PACERONE) 200 MG tablet Take 200 mg by mouth every morning.    12/14/2015 at Unknown time  . atorvastatin (LIPITOR) 80 MG tablet Take 80 mg by mouth every evening.    12/31/2015 at Unknown time  . furosemide (LASIX) 40 MG tablet Take 40 mg by mouth 2 (two) times daily.   12/22/2015 at Unknown time  . hydrochlorothiazide (HYDRODIURIL) 25 MG tablet Take 25 mg by mouth every morning.   12/31/2015 at Unknown time  . levothyroxine (SYNTHROID, LEVOTHROID) 150 MCG tablet Take 150 mcg by mouth daily before breakfast.   01/02/2016 at Unknown time  . metoprolol tartrate (LOPRESSOR) 25 MG tablet Take 1 tablet (25 mg total) by mouth 2 (two) times  daily. 60 tablet 0 12/31/2015 at Unknown time  . potassium chloride SA (K-DUR,KLOR-CON) 20 MEQ tablet Take 20 mEq by mouth every morning.    2015/05/17 at Unknown time  . insulin glargine (LANTUS) 100 UNIT/ML injection Inject 0.4 mLs (40 Units total) into the skin daily. 10 mL 2     Assessment: 9/21 :   INR @ 14:00 = 11.0              INR @ 20:00 = 1.12  09/22   INR @ 01:30 = 1.07             INR @ 14:41 = 1.10   Goal of Therapy:  INR < 2   Plan:  Will draw INR every 6 hrs X 4 and then daily.   Toran Murch D 01/02/2016,6:48 PM

## 2016-01-02 NOTE — Progress Notes (Signed)
ANTICOAGULATION CONSULT NOTE - Initial Consult  Pharmacy Consult for [facility]  Indication: supra therapeutic INR   Allergies  Allergen Reactions  . Niaspan [Niacin Er] Other (See Comments)    Reaction:  Unknown   . Tape Rash    Patient Measurements: Height: 6' (182.9 cm) Weight: 203 lb 14.8 oz (92.5 kg) IBW/kg (Calculated) : 77.6 Heparin Dosing Weight:   Vital Signs: Temp: 98.3 F (36.8 C) (09/22 0230) BP: 86/55 (09/22 0400) Pulse Rate: 85 (09/22 0400)  Labs:  Recent Labs  01/14/16 1400 01-14-16 1617 Jan 14, 2016 2006 01/02/16 0132  HGB 11.5* 11.9*  --  11.3*  11.4*  HCT 34.2* 34.2*  --  32.7*  33.0*  PLT 196  --   --  137*  APTT 89*  --   --   --   LABPROT >90.0*  --  14.5 13.9  INR >11.00*  --  1.12 1.07  CREATININE 5.27*  --   --  4.12*  TROPONINI 0.16* 0.13*  --   --     Estimated Creatinine Clearance: 18.3 mL/min (by C-G formula based on SCr of 4.12 mg/dL (H)).   Medical History: Past Medical History:  Diagnosis Date  . AICD (automatic cardioverter/defibrillator) present   . Atrial fibrillation (HCC)   . CHF (congestive heart failure) (HCC)    chronic left-sided systolic  . Chronic kidney disease   . Decubitus ulcer on ankle   . Diabetes mellitus without complication (HCC)   . DVT (deep venous thrombosis) (HCC)   . DVT (deep venous thrombosis) (HCC)   . Gout   . Hyperthyroidism   . Ischemic cardiomyopathy   . Myocardial infarction (HCC)   . Neuropathy (HCC)   . Osteoarthritis   . 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 Shortness of breath dyspnea   . Thyroid disease    hypothyroidism  . Venous stasis ulcer (HCC)   . Ventricular fibrillation (HCC)     Medications:  Prescriptions Prior to Admission  Medication Sig Dispense Refill Last Dose  . allopurinol (ZYLOPRIM) 100 MG tablet Take 200 mg by mouth every morning.    01/14/2016 at Unknown time  . amiodarone (PACERONE) 200 MG tablet Take 200 mg by mouth every  morning.    2016/01/14 at Unknown time  . atorvastatin (LIPITOR) 80 MG tablet Take 80 mg by mouth every evening.    12/31/2015 at Unknown time  . furosemide (LASIX) 40 MG tablet Take 40 mg by mouth 2 (two) times daily.   01/14/2016 at Unknown time  . hydrochlorothiazide (HYDRODIURIL) 25 MG tablet Take 25 mg by mouth every morning.   January 14, 2016 at Unknown time  . levothyroxine (SYNTHROID, LEVOTHROID) 150 MCG tablet Take 150 mcg by mouth daily before breakfast.   2016-01-14 at Unknown time  . metoprolol tartrate (LOPRESSOR) 25 MG tablet Take 1 tablet (25 mg total) by mouth 2 (two) times daily. 60 tablet 0 12/31/2015 at Unknown time  . potassium chloride SA (K-DUR,KLOR-CON) 20 MEQ tablet Take 20 mEq by mouth every morning.    01/14/2016 at Unknown time  . insulin glargine (LANTUS) 100 UNIT/ML injection Inject 0.4 mLs (40 Units total) into the skin daily. 10 mL 2     Assessment: 9/21 :   INR @ 14:00 = 11.0              INR @ 20:00 = 1.12  09/22   INR @ 01:30 = 1.07  Goal of Therapy:  INR < 2  Plan:  Will draw INR every 4 hrs X 6 and then daily.   Yael Coppess S 01/02/2016,5:04 AM

## 2016-01-02 NOTE — Progress Notes (Signed)
[facility] Encounter Note  Patient: Nicholas Leonard / Admit Date: 30-Jan-2016 / Date of Encounter: 01/02/2016, 6:44 PM   Subjective: Patient is now having intermittent episodes of incessant ventricular tachycardia with rapid ventricular rate of 130-150 bpm. Defibrillator has been defibrillating the patient for short periods of time but the patient continues to have episodes of longer runs of ventricular tachycardia. EKG was analyzed showing that the patient has long QT interval with wide complex tachycardia. Magnesium was evaluated and was not 1.9 and potassium levels were normal. He is not have any episodes of chest discomfort or true angina or ischemia or elevated troponin consistent with myocardial infarction at this time there was no evidence of significant hypoxia and/or apparent pulmonary embolism. Further primary causes of ventricular tachycardia were evaluated which appeared to be negative at this time. The patient underwent intubation with oxygenation with some improvements although currently is hypotensive likely secondary to above issues. Ventricular tachycardia treatment was changed from amiodarone drip due to long QT interval which would likely respond better to lidocaine drip and magnesium infusion. These were therefore changed with some response to the patient with a heart rate of 88 bpm and no further incessant ventricular tachycardia  Review of Systems: Patient became obtunded  Objective: Telemetry: Wide complex tachycardia Physical Exam: Blood pressure (!) 69/53, pulse 86, temperature 100.1 F (37.8 C), temperature source Axillary, resp. rate 17, height 6' (1.829 m), weight 203 lb 14.8 oz (92.5 kg), SpO2 (!) 89 %. Body mass index is 27.66 kg/m. General: Well developed, well nourished, now obtunded Head: Normocephalic, atraumatic, sclera non-icteric, no xanthomas, nares are without discharge. Neck: No apparent masses Lungs: Normal respirations with no  wheezes, no rhonchi, some basilar rales , no crackles   Heart: Regular rate and rhythm, normal S1 S2, no murmur, no rub, no gallop, PMI is normal size and placement, carotid upstroke normal without bruit, jugular venous pressure normal Abdomen: Soft, non-distended with normoactive bowel sounds. No hepatosplenomegaly. Abdominal aorta is normal size without bruit Extremities: Trace edema, no clubbing, positive cyanosis, no ulcers,  Peripheral: 2+ radial, 2+ femoral, 2+ dorsal pedal pulses Neuro: Patient now paralyzed  ct.   Intake/Output Summary (Last 24 hours) at 01/02/16 1844 Last data filed at 01/02/16 1739  Gross per 24 hour  Intake          5563.93 ml  Output             1500 ml  Net          4063.93 ml    Inpatient Medications:  . allopurinol  200 mg Oral BH-q7a  . atorvastatin  80 mg Oral QPM  . chlorhexidine  15 mL Mouth Rinse BID  . fentaNYL (SUBLIMAZE) injection  100 mcg Intravenous Once  . [START ON 12/21/2015] Influenza vac split quadrivalent PF  0.5 mL Intramuscular Tomorrow-1000  . insulin aspart  0-9 Units Subcutaneous TID WC  . levothyroxine  150 mcg Oral QAC breakfast  . mouth rinse  15 mL Mouth Rinse q12n4p  . midazolam      . midazolam  4 mg Intravenous Once  . [START ON 01/05/2016] pantoprazole  40 mg Intravenous Q12H  . [START ON 12/12/2015] pneumococcal 23 valent vaccine  0.5 mL Intramuscular Tomorrow-1000  . potassium chloride  10 mEq Intravenous Q1 Hr x 6  . sodium chloride flush  10-40 mL Intracatheter Q12H  . sodium chloride flush  3 mL Intravenous Q12H  . vecuronium       Infusions:  .  sodium chloride 100 mL/hr at 01/02/16 1739  . fentaNYL infusion INTRAVENOUS    . lidocaine 2 mg/min (01/02/16 1701)  . octreotide  (SANDOSTATIN)    IV infusion 50 mcg/hr (01/02/16 1210)  . pantoprozole (PROTONIX) infusion 8 mg/hr (01/02/16 1430)    Labs:  Recent Labs  01/02/16 0132  01/02/16 0749 01/02/16 0750 01/02/16 1140 01/02/16 1705  NA 134*  --   --  135   --  134*  K 3.1*  --   --  2.9* 2.7* 3.6  CL 91*  --   --  94*  --  97*  CO2 28  --   --  25  --  22  GLUCOSE 132*  --   --  206*  --  285*  BUN 137*  --   --  126*  --  113*  CREATININE 4.12*  --   --  3.71*  --  3.41*  CALCIUM 8.3*  --   --  9.1  --  8.5*  MG  --   < > 2.5*  --   --  2.2  PHOS 4.2  --   --   --  2.9  --   < > = values in this interval not displayed.  Recent Labs  01/16/16 1400 01/02/16 0750  AST 125* 129*  ALT 103* 102*  ALKPHOS 85 69  BILITOT 1.2 2.1*  PROT 7.9 6.1*  ALBUMIN 3.9 2.9*    Recent Labs  01/02/16 0132 01/02/16 0750  WBC 10.8* 9.2  HGB 11.3*  11.4* 10.9*  HCT 32.7*  33.0* 31.1*  MCV 88.8 88.4  PLT 137* 116*    Recent Labs  01-16-2016 1400 01/16/2016 1617  TROPONINI 0.16* 0.13*   Invalid input(s): POCBNP  Recent Labs  2016-01-16 1400  HGBA1C 8.4*     Weights: Filed Weights   2016-01-16 1403 01/16/2016 1618  Weight: 211 lb (95.7 kg) 203 lb 14.8 oz (92.5 kg)     Radiology/Studies:  Dg Chest 1 View  Result Date: 01/16/16 CLINICAL DATA:  Rectal bleeding for 5 days, dizziness, weakness EXAM: CHEST 1 VIEW COMPARISON:  06/25/2014 FINDINGS: Cardiomediastinal silhouette is stable. Elevation of the right hemidiaphragm again noted. Status post CABG. Single lead cardiac pacemaker is unchanged in position. No infiltrate or pulmonary edema. Stable scarring or atelectasis in lingula. IMPRESSION: No active disease. Electronically Signed   By: Natasha Mead M.D.   On: January 16, 2016 15:52   US Renal  Result Date: 01/02/2016 CLINICAL DATA:  Acute renal failure EXAM: RENAL / URINARY TRACT ULTRASOUND COMPLETE COMPARISON:  None. FINDINGS: Right Kidney: Length: 10.2 cm. Echogenicity within normal limits. No mass or hydronephrosis visualized. Left Kidney: Length: 10.0 cm. Echogenicity within normal limits. No mass or hydronephrosis visualized. Bladder: Decompressed with Foley catheter in place, not well visualized. IMPRESSION: No acute findings.  No  hydronephrosis. Electronically Signed   By: Charlett Nose M.D.   On: 01/02/2016 11:12   US Abdomen Limited Ruq  Result Date: 01/02/2016 CLINICAL DATA:  Abnormal LFTs EXAM: US ABDOMEN LIMITED - RIGHT UPPER QUADRANT COMPARISON:  None. FINDINGS: Gallbladder: Gallbladder is mildly distended. Mild gallbladder wall thickening at 6 mm. No visible stones. Small amount sludge in the gallbladder. Negative sonographic Murphy's. Common bile duct: Diameter: Normal caliber, 4 mm Liver: Mildly increased echotexture suggesting fatty infiltration. No biliary ductal dilatation or focal lesion. IMPRESSION: Increased echotexture in the liver suggesting fatty infiltration. Gallbladder is mildly distended with small amount of sludge. Mild gallbladder wall thickening. Cannot completely  exclude acalculous cholecystitis. If there is clinical concern, consider nuclear medicine hepatobiliary scan. Electronically Signed   By: Charlett NoseKevin  Dover M.D.   On: 01/02/2016 11:12     Assessment and Recommendation  70 y.o. male with known chronic systolic dysfunction congestive heart failure status post previous history of deep venous thrombosis on anticoagulation with GI bleed secondary to high INR and new onset of incessant ventricular tachycardia with slow rate status post multiple defibrillator shocks without evidence of worsening heart failure and/or myocardial infarction not responding to amiodarone due to possible long QT interval 1. Magnesium bolus and repeat as needed 2. Discontinuation of amiodarone due to concerns of long QT interval incessant ventricular tachycardia 3. Lidocaine for ventricular tachycardia 4. Further investigation of other possible causes of ventricular tachycardia including ischemia hypoxia 5. Continue supportive care of hypotension and hypoxia with minimal fluid resuscitation and levo fed as well as intubation with oxygenation 6. Further diagnostic testing and treatment options after above  Signed, Arnoldo HookerBruce  Vlada Uriostegui M.D. FACC

## 2016-01-02 NOTE — Progress Notes (Signed)
Patient stablized on ventilator.  No pressors required at this time with MAP greater than 65.  SR with PVC.  Excellent UOP this shift.  No stool today and no emesis.  Dr. Gwen PoundsKowalski updated patient's wife in waiting area.  Report [facility] to [facility], RN who is taking over patient's care.

## 2016-01-02 NOTE — Progress Notes (Signed)
Initial Nutrition Assessment  DOCUMENTATION CODES:   Not applicable  INTERVENTION:  -Await diet progression and tolerance -Recommend addition of nutritional supplement once diet advanced and pt tolerating po  NUTRITION DIAGNOSIS:   Inadequate oral intake related to poor appetite, acute illness as evidenced by per patient/family report.  GOAL:   Patient will meet greater than or equal to 90% of their needs  MONITOR:   Diet advancement, Labs, Weight trends  REASON FOR ASSESSMENT:   Malnutrition Screening Tool    ASSESSMENT:   [age] male admitted with acute GI bleed, upper and lower, with supratherapeutic INR, acute on CKD III. Pt with hx of ischemia CM with EF 25%  [facility]; appetite has been poor but drinking fluids. Weight relatively stable per weight encounters.   Unable to complete Nutrition-Focused physical exam at this time.   Past Medical History:  Diagnosis Date  . AICD (automatic cardioverter/defibrillator) present   . Atrial fibrillation (HCC)   . CHF (congestive heart failure) (HCC)    chronic left-sided systolic  . Chronic kidney disease   . Decubitus ulcer on ankle   . Diabetes mellitus without complication (HCC)   . DVT (deep venous thrombosis) (HCC)   . DVT (deep venous thrombosis) (HCC)   . Gout   . Hyperthyroidism   . Ischemic cardiomyopathy   . Myocardial infarction (HCC)   . Neuropathy (HCC)   . Osteoarthritis   . 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 Shortness of breath dyspnea   . Thyroid disease    hypothyroidism  . Venous stasis ulcer (HCC)   . Ventricular fibrillation (HCC)      Diet Order:  Diet clear liquid Room service appropriate? Yes; Fluid consistency: Thin  Skin:  Reviewed, no issues  Last BM:  9/21   Labs: potassium 2.7, BUN 126, Creatinine 3.71, Hgb 10.9  Glucose Profile:   Recent Labs  2015-11-06 2129 01/02/16 0737 01/02/16 1120  GLUCAP 161*  193* 218*   Meds: NS at 100 ml/hr  Height:   Ht Readings from Last 1 Encounters:  2015-11-06 6' (1.829 m)    Weight:   Wt Readings from Last 1 Encounters:  2015-11-06 203 lb 14.8 oz (92.5 kg)   Wt Readings from Last 10 Encounters:  2015-11-06 203 lb 14.8 oz (92.5 kg)  12/26/15 211 lb (95.7 kg)  06/05/15 208 lb 8.9 oz (94.6 kg)  05/14/15 204 lb (92.5 kg)  09/03/14 188 lb (85.3 kg)    BMI:  Body mass index is 27.66 kg/m.  Estimated Nutritional Needs:   Kcal:  2200-2500 kcals  Protein:  93-112 g  Fluid:  >/= 1.8 L  EDUCATION NEEDS:   No education needs identified at this time  Romelle StarcherCate Athaliah Baumbach MS, RD, LDN [phone] Pager  [phone] Weekend/On-Call Pager

## 2016-01-02 NOTE — Progress Notes (Signed)
Dr. Gwen PoundsKowalski at bedside speaking with Dr. Dema SeverinMungal.  Dr. Gwen PoundsKowalski gave order for stat EKG.  After EKG performed Dr. Gwen PoundsKowalski gave order to discontinue amiodarone drip and to continue lidocaine drip.

## 2016-01-02 NOTE — Progress Notes (Signed)
RN spoke with Dr. Allena KatzPatel and MD has called and updated patient's wife.  RN asked about need for patient to be ICU status and MD gave order for ICU.

## 2016-01-02 NOTE — Procedures (Signed)
Procedure Note:  Orotracheal Intubation  Implied consent due to emergent nature of patient's condition. Correct Patient, Name & ID confirmed.  The patient was pre-oxygenated and then, under direct visualization, a 8.0 mm cuffed endotracheal tube was placed through the vocal cords into the trachea, using the Glidescope.  Total attempts made 1, using Glidescope During intubation an assistant applied gentle pressure to the cricoid cartilage.  Position confirmed by auscultation of lungs (good breath sounds bilaterally) and no stomach sounds.  Tube secured at 25 cm. Pulse ox 96 %. CO2 detector in place with appropriate color change.   Pt tolerated procedure well.  No complications were noted.   CXR ordered.   Stephanie AcreVishal Delina Kruczek, MD [place] [facility] Pager [phone] On Call Pager [phone]

## 2016-01-02 NOTE — Progress Notes (Signed)
Patient back in NSR.  Patient alert and talking with RN.  Continuing to monitor at bedside.

## 2016-01-02 NOTE — Progress Notes (Signed)
Dr. Allena KatzPatel in to see patient this morning and MD gave order to check CBC and CMET.  MD also gave order to continue NS @ 100 cc/H continuous.  This RN spoke with Darl PikesSusan, medical service coordinator [facility] and she stated that if patient had a life threatening event that family could be notified but that it has to be approved by her captain first.  This RN and Darl PikesSusan will be in contact today regarding updating the family.

## 2016-01-02 NOTE — Progress Notes (Signed)
Update  Patient noted to have hypokalemia currently on his second of 6 potassium run, had 5 episodes of sustained V. tach lasting less than 10 seconds, within 5 minutes and AICD firing. During this time I spoke with cardiology, Dr. Gwen PoundsKowalski.  After each AICD firing patient noted to be mentating well complaining of chest pain, but with soft blood pressure  Plan: -Reloaded with amiodarone 150 MG and continue drip -restart lidocaine gtt -Check BMP, magnesium, phosphate -Noted to have systolics blood pressure in the 70s and 80s, give another 2 50 mL NS bolus  Full consult note to follow.  Critical care time - 35 mins  Stephanie AcreVishal Kassie Keng, MD [place] [facility] Pager [phone]- [phone] (please enter 7-digits) On Call Pager - [phone] (please enter 7-digits)

## 2016-01-02 NOTE — Care Management (Signed)
RNCM consult received due [facility].  RNCM will continue to follow but jail has providers that monitor patient's coumadin, etc. Currently there is no RNCM needs as the jail is in control of his medical needs.

## 2016-01-02 NOTE — Progress Notes (Signed)
PARENTERAL NUTRITION CONSULT NOTE - INITIAL  Pharmacy Consult for Electrolyte Monitoring Indication: Hypokalemia  Allergies  Allergen Reactions  . Niaspan [Niacin Er] Other (See Comments)    Reaction:  Unknown   . Tape Rash    Patient Measurements: Height: 6' (182.9 cm) Weight: 203 lb 14.8 oz (92.5 kg) IBW/kg (Calculated) : 77.6  Vital Signs: Temp: 97.7 F (36.5 C) (09/22 0800) Temp Source: Oral (09/22 0800) BP: 102/67 (09/22 1015) Pulse Rate: 94 (09/22 1015) Intake/Output from previous day: 09/21 0701 - 09/22 0700 In: 3568.8 [I.V.:2320.5; Blood:600; IV Piggyback:648.3] Out: 1200 [Urine:1200] Intake/Output from this shift: Total I/O In: 839.9 [I.V.:639.9; IV Piggyback:200] Out: 300 [Urine:300]  Labs:  Recent Labs  12/31/2015 1400 12/19/2015 1617 12/13/2015 2006 01/02/16 0132 01/02/16 0749 01/02/16 0750  WBC 23.4*  --   --  10.8*  --  9.2  HGB 11.5* 11.9*  --  11.3*  11.4*  --  10.9*  HCT 34.2* 34.2*  --  32.7*  33.0*  --  31.1*  PLT 196  --   --  137*  --  116*  APTT 89*  --   --   --   --   --   INR >11.00*  --  1.12 1.07 1.09  --      Recent Labs  12/28/2015 1400 12/30/2015 1617 01/02/16 0132 01/02/16 0147 01/02/16 0749 01/02/16 0750  NA 132*  --  134*  --   --  135  K 2.9*  --  3.1*  --   --  2.9*  CL 80*  --  91*  --   --  94*  CO2 30  --  28  --   --  25  GLUCOSE 192*  --  132*  --   --  206*  BUN 157*  --  137*  --   --  126*  CREATININE 5.27*  --  4.12*  --   --  3.71*  CALCIUM 9.6  --  8.3*  --   --  9.1  MG  --  2.2  --  2.0 2.5*  --   PHOS  --   --  4.2  --   --   --   PROT 7.9  --   --   --   --  6.1*  ALBUMIN 3.9  --   --   --   --  2.9*  AST 125*  --   --   --   --  129*  ALT 103*  --   --   --   --  102*  ALKPHOS 85  --   --   --   --  69  BILITOT 1.2  --   --   --   --  2.1*   Estimated Creatinine Clearance: 20.3 mL/min (by C-G formula based on SCr of 3.71 mg/dL (H)).    Recent Labs  12/28/2015 1616 12/12/2015 2129 01/02/16 0737   GLUCAP 204* 161* 193*    Medical History: Past Medical History:  Diagnosis Date  . AICD (automatic cardioverter/defibrillator) present   . Atrial fibrillation (HCC)   . CHF (congestive heart failure) (HCC)    chronic left-sided systolic  . Chronic kidney disease   . Decubitus ulcer on ankle   . Diabetes mellitus without complication (HCC)   . DVT (deep venous thrombosis) (HCC)   . DVT (deep venous thrombosis) (HCC)   . Gout   . Hyperthyroidism   . Ischemic cardiomyopathy   .  Myocardial infarction (HCC)   . Neuropathy (HCC)   . Osteoarthritis   . 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 Shortness of breath dyspnea   . Thyroid disease    hypothyroidism  . Venous stasis ulcer (HCC)   . Ventricular fibrillation (HCC)     Medications:  Scheduled:  . allopurinol  200 mg Oral BH-q7a  . atorvastatin  80 mg Oral QPM  . chlorhexidine  15 mL Mouth Rinse BID  . insulin aspart  0-9 Units Subcutaneous TID WC  . levothyroxine  150 mcg Oral QAC breakfast  . mouth rinse  15 mL Mouth Rinse q12n4p  . midazolam      . midazolam  4 mg Intravenous Once  . [START ON 01/05/2016] pantoprazole  40 mg Intravenous Q12H  . sodium chloride flush  3 mL Intravenous Q12H     Assessment: Pharmacy consulted to assist in managing electrolytes in this 70 y/o M admitted with GIB.   Plan:  Potassium replaced with 4 runs this AM. Will recheck potassium and replace as needed.   Luisa Hart D 01/02/2016,11:26 AM

## 2016-01-02 NOTE — Progress Notes (Signed)
Spoke with Dondra SpryGail over the phone and updated

## 2016-01-02 NOTE — Progress Notes (Signed)
Update Patient continued to have multiple episodes of ventricular tachycardia with multiple episodes of AICD firing. He did become hypotensive with systolics in the 70s, and became more obtunded and unable to protect his airway. During one of these episodes of ventricular tachycardia, on the monitor looked like he was having torsades the point, and he was given 1 g of mag stat. This seemed to break his tachyarrhythmia and he was emergently intubated. Dr. Gwen PoundsKowalski present after patient intubated.    Critical care time - 35 mins  Stephanie AcreVishal Juliya Magill, MD [place] [facility] Pager [phone] (please enter 7-digits) On Call Pager - [phone] (please enter 7-digits)

## 2016-01-02 NOTE — Progress Notes (Signed)
Dr. Servando SnareWohl present and gave order for clear liquid diet, advance as tolerated.

## 2016-01-03 ENCOUNTER — Inpatient Hospital Stay: Payer: Medicare Other

## 2016-01-03 DIAGNOSIS — R570 Cardiogenic shock: Secondary | ICD-10-CM

## 2016-01-03 DIAGNOSIS — R7989 Other specified abnormal findings of blood chemistry: Secondary | ICD-10-CM

## 2016-01-03 LAB — HEPATITIS PANEL, ACUTE
HCV Ab: <0.1 s/co ratio (ref 0.0–0.9)
HEP B C IGM: NEGATIVE
Hep A IgM: NEGATIVE
Hepatitis B Surface Ag: NEGATIVE

## 2016-01-03 LAB — BLOOD GAS, ARTERIAL
Acid-base deficit: 2 mmol/L (ref 0.0–2.0)
Bicarbonate: 24.2 mmol/L (ref 20.0–28.0)
FIO2: 0.5
O2 Saturation: 97.9 %
PCO2 ART: 47 mmHg (ref 32.0–48.0)
PH ART: 7.32 — AB (ref 7.350–7.450)
PO2 ART: 111 mmHg — AB (ref 83.0–108.0)
Patient temperature: 37

## 2016-01-03 LAB — CERULOPLASMIN: Ceruloplasmin: 31.8 mg/dL — ABNORMAL HIGH (ref 16.0–31.0)

## 2016-01-03 LAB — CBC
HEMATOCRIT: 32.7 % — AB (ref 40.0–52.0)
Hemoglobin: 11.2 g/dL — ABNORMAL LOW (ref 13.0–18.0)
MCH: 31.3 pg (ref 26.0–34.0)
MCHC: 34.2 g/dL (ref 32.0–36.0)
MCV: 91.6 fL (ref 80.0–100.0)
Platelets: 130 10*3/uL — ABNORMAL LOW (ref 150–440)
RBC: 3.57 MIL/uL — ABNORMAL LOW (ref 4.40–5.90)
RDW: 16.1 % — AB (ref 11.5–14.5)
WBC: 7.1 10*3/uL (ref 3.8–10.6)

## 2016-01-03 LAB — PROTIME-INR
INR: 1.59
PROTHROMBIN TIME: 19.1 s — AB (ref 11.4–15.2)

## 2016-01-03 LAB — BASIC METABOLIC PANEL
ANION GAP: 15 (ref 5–15)
BUN: 119 mg/dL — ABNORMAL HIGH (ref 6–20)
CALCIUM: 8.1 mg/dL — AB (ref 8.9–10.3)
CO2: 19 mmol/L — AB (ref 22–32)
Chloride: 98 mmol/L — ABNORMAL LOW (ref 101–111)
Creatinine, Ser: 4.35 mg/dL — ABNORMAL HIGH (ref 0.61–1.24)
GFR, EST AFRICAN AMERICAN: 15 mL/min — AB (ref >60)
GFR, EST NON AFRICAN AMERICAN: 13 mL/min — AB (ref >60)
Glucose, Bld: 309 mg/dL — ABNORMAL HIGH (ref 65–99)
Potassium: 3.7 mmol/L (ref 3.5–5.1)
Sodium: 132 mmol/L — ABNORMAL LOW (ref 135–145)

## 2016-01-03 LAB — TROPONIN I: Troponin I: 11.11 ng/mL (ref <0.03)

## 2016-01-03 LAB — GLUCOSE, CAPILLARY
Glucose-Capillary: 293 mg/dL — ABNORMAL HIGH (ref 65–99)
Glucose-Capillary: 298 mg/dL — ABNORMAL HIGH (ref 65–99)

## 2016-01-03 LAB — CK: Total CK: 325 U/L (ref 49–397)

## 2016-01-03 LAB — TSH: TSH: 1.009 u[IU]/mL (ref 0.350–4.500)

## 2016-01-03 LAB — MAGNESIUM: Magnesium: 2.7 mg/dL — ABNORMAL HIGH (ref 1.7–2.4)

## 2016-01-03 MED ORDER — SODIUM BICARBONATE 8.4 % IV SOLN
100.0000 meq | Freq: Once | INTRAVENOUS | Status: AC
  Administered 2016-01-03: 100 meq via INTRAVENOUS

## 2016-01-03 MED ORDER — POTASSIUM CHLORIDE 20 MEQ PO PACK
20.0000 meq | PACK | Freq: Once | ORAL | Status: AC
  Administered 2016-01-03: 20 meq
  Filled 2016-01-03: qty 1

## 2016-01-03 MED ORDER — SODIUM BICARBONATE 8.4 % IV SOLN
50.0000 meq | Freq: Once | INTRAVENOUS | Status: AC
  Administered 2016-01-03: 50 meq via INTRAVENOUS

## 2016-01-03 MED ORDER — SODIUM BICARBONATE 8.4 % IV SOLN
INTRAVENOUS | Status: AC
  Administered 2016-01-03: 100 meq via INTRAVENOUS
  Filled 2016-01-03: qty 100

## 2016-01-03 MED ORDER — LIDOCAINE BOLUS VIA INFUSION
100.0000 mg | Freq: Once | INTRAVENOUS | Status: AC
  Administered 2016-01-03: 100 mg via INTRAVENOUS

## 2016-01-03 MED ORDER — SODIUM CHLORIDE 0.9 % IV SOLN
1.0000 g | Freq: Once | INTRAVENOUS | Status: AC
  Administered 2016-01-03: 1 g via INTRAVENOUS
  Filled 2016-01-03: qty 10

## 2016-01-03 MED ORDER — MORPHINE BOLUS VIA INFUSION
5.0000 mg | INTRAVENOUS | Status: DC | PRN
  Filled 2016-01-03: qty 20

## 2016-01-03 MED ORDER — DEXTROSE 5 % IV SOLN
0.0000 ug/min | INTRAVENOUS | Status: DC
  Administered 2016-01-03: 50 ug/min via INTRAVENOUS
  Filled 2016-01-03 (×2): qty 16

## 2016-01-03 MED ORDER — POTASSIUM CHLORIDE 10 MEQ/50ML IV SOLN
10.0000 meq | INTRAVENOUS | Status: AC
  Administered 2016-01-03 (×2): 10 meq via INTRAVENOUS
  Filled 2016-01-03 (×2): qty 50

## 2016-01-03 MED ORDER — VASOPRESSIN 20 UNIT/ML IV SOLN
0.0300 [IU]/min | INTRAVENOUS | Status: DC
  Administered 2016-01-03: 0.03 [IU]/min via INTRAVENOUS
  Filled 2016-01-03: qty 2

## 2016-01-03 MED ORDER — DEXTROSE 5 % IV SOLN
5.0000 mg/h | INTRAVENOUS | Status: DC
  Administered 2016-01-03: 5 mg/h via INTRAVENOUS
  Filled 2016-01-03: qty 25

## 2016-01-03 MED ORDER — MORPHINE 100MG IN NS 100ML (1MG/ML) PREMIX INFUSION
10.0000 mg/h | INTRAVENOUS | Status: DC
Start: 2016-01-03 — End: 2016-01-03
  Administered 2016-01-03: 10 mg/h via INTRAVENOUS
  Filled 2016-01-03: qty 100

## 2016-01-03 MED ORDER — DEXTROSE 5 % IV SOLN
30.0000 ug/min | INTRAVENOUS | Status: DC
  Filled 2016-01-03: qty 4

## 2016-01-03 MED ORDER — SODIUM BICARBONATE 8.4 % IV SOLN
INTRAVENOUS | Status: AC
  Administered 2016-01-03: 50 meq via INTRAVENOUS
  Filled 2016-01-03: qty 50

## 2016-01-03 MED ORDER — MAGNESIUM SULFATE IN D5W 1-5 GM/100ML-% IV SOLN
1.0000 g | Freq: Once | INTRAVENOUS | Status: AC
  Administered 2016-01-03: 1 g via INTRAVENOUS
  Filled 2016-01-03: qty 100

## 2016-01-03 MED ORDER — SODIUM CHLORIDE 0.9 % IV BOLUS (SEPSIS)
500.0000 mL | INTRAVENOUS | Status: AC
  Administered 2016-01-03: 500 mL via INTRAVENOUS

## 2016-01-03 MED ORDER — LIDOCAINE HCL (CARDIAC) 20 MG/ML IV SOLN
INTRAVENOUS | Status: AC
  Filled 2016-01-03: qty 5

## 2016-01-03 MED ORDER — PHENYLEPHRINE HCL 10 MG/ML IJ SOLN
30.0000 ug/min | INTRAVENOUS | Status: DC
  Administered 2016-01-03: 100 ug/min via INTRAVENOUS
  Administered 2016-01-03: 200 ug/min via INTRAVENOUS
  Filled 2016-01-03 (×4): qty 1

## 2016-01-03 MED FILL — Medication: Qty: 1 | Status: AC

## 2016-01-04 LAB — URINE CULTURE
Culture: NO GROWTH
SPECIAL REQUESTS: NORMAL

## 2016-01-04 LAB — ANTI-SMOOTH MUSCLE ANTIBODY, IGG: F-ACTIN AB IGG: 12 U (ref 0–19)

## 2016-01-04 LAB — MITOCHONDRIAL ANTIBODIES: Mitochondrial M2 Ab, IgG: 4 Units (ref 0.0–20.0)

## 2016-01-05 LAB — ANTINUCLEAR ANTIBODIES, IFA: ANTINUCLEAR ANTIBODIES, IFA: NEGATIVE

## 2016-01-06 LAB — ALPHA-1 ANTITRYPSIN PHENOTYPE: A1 ANTITRYPSIN SER: 251 mg/dL — AB (ref 90–200)

## 2016-01-11 NOTE — Progress Notes (Signed)
Pharmacy note  Consult received for slow magnesium infusion for torsades/VT. Patient magnesium level already 2.5. Spoke with NP, and per cardiology this is their only recommendation at this point. Will proceed with 1 gram magnesium sulfate infused over 15 minutes.

## 2016-01-11 NOTE — Progress Notes (Signed)
PARENTERAL NUTRITION CONSULT NOTE - INITIAL  Pharmacy Consult for Electrolyte Monitoring Indication: Hypokalemia  Allergies  Allergen Reactions  . Niaspan [Niacin Er] Other (See Comments)    Reaction:  Unknown   . Tape Rash    Patient Measurements: Height: 6' (182.9 cm) Weight: 203 lb 14.8 oz (92.5 kg) IBW/kg (Calculated) : 77.6  Vital Signs: Temp: 98.8 F (37.1 C) (09/22 1830) Temp Source: Axillary (09/22 1830) BP: 71/56 (09/23 0000) Pulse Rate: 92 (09/23 0000) Intake/Output from previous day: 09/22 0701 - 09/23 0700 In: 4872.9 [P.O.:480; I.V.:2942.9; IV Piggyback:1450] Out: 1255 [Urine:1255] Intake/Output from this shift: Total I/O In: 1127.8 [I.V.:927.8; IV Piggyback:200] Out: 55 [Urine:55]  Labs:  Recent Labs  12/31/2015 1400 12/31/2015 1617  01/02/16 0132 01/02/16 0749 01/02/16 0750 01/02/16 1441 01/02/16 2029  WBC 23.4*  --   --  10.8*  --  9.2  --   --   HGB 11.5* 11.9*  --  11.3*  11.4*  --  10.9*  --   --   HCT 34.2* 34.2*  --  32.7*  33.0*  --  31.1*  --   --   PLT 196  --   --  137*  --  116*  --   --   APTT 89*  --   --   --   --   --   --   --   INR >11.00*  --   < > 1.07 1.09  --  1.10 1.16  < > = values in this interval not displayed.   Recent Labs  12/31/2015 1400  01/02/16 0132  01/02/16 0749 01/02/16 0750 01/02/16 1140 01/02/16 1705 01/02/16 2251  NA 132*  --  134*  --   --  135  --  134* 133*  K 2.9*  --  3.1*  --   --  2.9* 2.7* 3.6 4.0  CL 80*  --  91*  --   --  94*  --  97* 97*  CO2 30  --  28  --   --  25  --  22 23  GLUCOSE 192*  --  132*  --   --  206*  --  285* 317*  BUN 157*  --  137*  --   --  126*  --  113* 116*  CREATININE 5.27*  --  4.12*  --   --  3.71*  --  3.41* 3.92*  CALCIUM 9.6  --  8.3*  --   --  9.1  --  8.5* 8.5*  MG  --   < >  --   < > 2.5*  --   --  2.2 2.5*  PHOS  --   --  4.2  --   --   --  2.9  --  5.2*  PROT 7.9  --   --   --   --  6.1*  --   --   --   ALBUMIN 3.9  --   --   --   --  2.9*  --   --   --    AST 125*  --   --   --   --  129*  --   --   --   ALT 103*  --   --   --   --  102*  --   --   --   ALKPHOS 85  --   --   --   --  69  --   --   --  BILITOT 1.2  --   --   --   --  2.1*  --   --   --   < > = values in this interval not displayed. Estimated Creatinine Clearance: 19.2 mL/min (by C-G formula based on SCr of 3.92 mg/dL (H)).    Recent Labs  01/02/16 1612 01/02/16 1945 01/02/16 2349  GLUCAP 230* 335* 305*    Medical History: Past Medical History:  Diagnosis Date  . AICD (automatic cardioverter/defibrillator) present   . Atrial fibrillation (HCC)   . CHF (congestive heart failure) (HCC)    chronic left-sided systolic  . Chronic kidney disease   . Decubitus ulcer on ankle   . Diabetes mellitus without complication (HCC)   . DVT (deep venous thrombosis) (HCC)   . DVT (deep venous thrombosis) (HCC)   . Gout   . Hyperthyroidism   . Ischemic cardiomyopathy   . Myocardial infarction (HCC)   . Neuropathy (HCC)   . Osteoarthritis   . 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 Shortness of breath dyspnea   . Thyroid disease    hypothyroidism  . Venous stasis ulcer (HCC)   . Ventricular fibrillation (HCC)     Medications:  Scheduled:  . allopurinol  200 mg Oral BH-q7a  . atorvastatin  80 mg Oral QPM  . chlorhexidine  15 mL Mouth Rinse BID  . Influenza vac split quadrivalent PF  0.5 mL Intramuscular Tomorrow-1000  . insulin aspart  0-9 Units Subcutaneous Q6H  . ipratropium-albuterol  3 mL Nebulization Q6H  . levothyroxine  150 mcg Oral QAC breakfast  . mouth rinse  15 mL Mouth Rinse QID  . midazolam      . midazolam  4 mg Intravenous Once  . [START ON 01/05/2016] pantoprazole  40 mg Intravenous Q12H  . pneumococcal 23 valent vaccine  0.5 mL Intramuscular Tomorrow-1000  . sodium chloride flush  10-40 mL Intracatheter Q12H  . sodium chloride flush  3 mL Intravenous Q12H  . vecuronium         Assessment: Pharmacy consulted to  assist in managing electrolytes in this 70 y/o M admitted with GIB.   Plan:  Potassium replaced with 4 runs this AM. Will recheck potassium and replace as needed.   9/22 23:00 electrolyte panel. No supplementation indicated. Recheck BMP and magnesium in AM.    Daniyla Pfahler S 2016/01/26,12:28 AM

## 2016-01-11 NOTE — Progress Notes (Signed)
Pt Time of Death 10:46.  No heartbeat noted on auscultation, and asystole present on monitor.  Verified by Cheral BayKatie Clayton RN.

## 2016-01-11 NOTE — Progress Notes (Signed)
Pt having multiple runs of Vtach followed by periods of bradycardia.  MAP remains around 60.

## 2016-01-11 NOTE — Progress Notes (Signed)
Pt extubated per comfort care orders.  Morphine and ativan started, and other drips discontinued.  Family at bedside. Magnet placed over pacemaker.

## 2016-01-11 NOTE — Progress Notes (Signed)
Family has decided to withdraw care from patient.  Family at bedside to be with patient.

## 2016-01-11 NOTE — Progress Notes (Signed)
Dr. Dema SeverinMungal at bedside.  Speaking with family.

## 2016-01-11 NOTE — Progress Notes (Addendum)
Dr. Gwen PoundsKowalski at bedside  with [employer] tech to interrogate pacemaker

## 2016-01-11 NOTE — Discharge Summary (Addendum)
Death Summary  Date of admission: June 26, 2015 Date of death 12/19/2015  Admitting physician Dr. Tildon HuskyModi  History of present illness June 26, 2015 Nicholas Leonard  is a 70 y.o. male with a known history of EtOH abuse who quit drinking about 2-1/2 years ago, DVT on Coumadin, ischemic cardiomyopathy ejection fraction of 25% and diabetes who comes from jail with complaint of rectal bleeding and vomiting blood. Patient reported to the staff and nursing 5 days ago that he was vomiting blood and had rectal bleeding however he was not brought to the emergency room and was not seen by a physician until today according to the patient. When patient was brought to the emergency room his systolic blood pressure was in the 70s. He received 2 L of normal saline bolus and was transfused 1 unit of PRBCs. Laboratory work came back showing a hemoglobin of 11 and INR of greater than 11. Patient reports that he has not taken Coumadin in 2 weeks.   Hospital course: Patient was admitted to the ICU, his supratherapeutic INR was reversed with K Sentra, and his hemoglobin was stabilized. During his hospitalization he had no further episodes of rectal bleeding or hematemesis. On the morning of 01/02/2016 he was noted to have several episodes of sustained V. tach followed by hypotension, lab abnormality at that time was noted to be low potassium, for which it was replaced. On the afternoon and late evening of 01/02/2016 the patient continued to have several runs of ventricular tachycardia, followed by hypotension and altered mental status, his AICD was noted to be firing multiple times. Over the next 24 hours he continued to have several runs of ventricular tachycardia, he was seen by cardiology, and intensivist, several different attempts to break his tachyarrhythmia were attempted with bolus of amiodarone, infusion of magnesium, and lidocaine drip, all these measures would temporarily help, but patient would become refractory. On the  evening of 01/02/2016 after one of his ventricular tachycardia events, he was noted to be severely obtunded and was intubated due to hypotension, altered mental status, and inability to protect his airway. Again as stated above he continued to have tachyarrhythmias over the next 24 hours. On the morning of 01/09/2016 Cardiology and [employer] Rep evaluated his device, it was interrogated, and the settings on his AICD were adjusted by cardiology and [employer] representative. He was noted to be requiring 100% FiO2 on the ventilator along with 3 vasopressors and still having episodes of hypotension, he also had an episode of bradycardia down to upper 30s and had to be given a dose of atropine. Given the acute decline in his health along with multiorgan system failure, and acute heart failure and followed by another myocardial infarct process, which now show a troponin of 11 his family decided on withdrawal of care and comfort care.   Time of Death 1040am Date of Death 12/12/2015  Problem list: Acute GI bleed Supratherapeutic INR Cardiogenic shock Elevated troponin NSTEMI myocardial infarction Acute on chronic combined heart failure Warfarin reversal Ventricular tachycardia Ischemic cardiopathy with ejection fraction of 25% Hypokalemia  AICD firing Elevated liver enzymes-mild    Nicholas AcreVishal Apollos Tenbrink, MD [facility] Pager [phone]- [phone] (please enter 7-digits) On Call Pager - [phone] (please enter 7-digits)

## 2016-01-11 NOTE — Progress Notes (Signed)
[facility] Encounter Note  Patient: Nicholas Leonard / Admit Date: 12/20/2015 / Date of Encounter: 12/12/2015, 9:37 AM   Subjective: Patient is still sedated and hemodynamically stable with the knee in levo fed for blood pressure control. Patient continues to have runs of wide complex tachycardia between 10 and 16 beats followed by sinus bradycardia with some hemodynamic compromise during wide complex. Previous concerns were that the patient had long QT interval tachycardia and therefore magnesium infusion was started with some slight improvements. Additionally the patient had a discontinuation of amiodarone to reduce the possibility of long QT interval. Lidocaine was continued although now causing some bradycardia. Lidocaine is discontinued at this time due to concerns of significant side effects. The patient does have reasonable oxygenation after intubation without any other signs or symptoms of bleeding or complication after previous deep venous thrombosis treatment and high INR with rectal bleeding earlier today  The patient now has had worsening cardiac output and issues with hypotension despite levo fed use and now is on 100% oxygenation with poor O2 sat staying worsening acute on chronic systolic dysfunction heart failure and now evidence of myocardial infarction with elevated troponin. The fibrillator was reprogrammed to support slow heart rate at VVI of 60 bpm with no further evidence of long runs of ventricular tachycardia. With prognosis poor due to above issues the patient has had family members discuss the possibility of DO NOT RESUSCITATE at this time  Review of Systems:   Objective: Telemetry: Sinus bradycardia intermixed with long runs of wide complex tachycardia Physical Exam: Blood pressure (!) 90/51, pulse 60, temperature 98.5 F (36.9 C), temperature source Axillary, resp. rate 19, height 6' (1.829 m), weight 203 lb 14.8 oz (92.5 kg), SpO2 96 %. Body mass  index is 27.66 kg/m. General: Intubated and sedated Head: Normocephalic, atraumatic, sclera non-icteric, no xanthomas, nares are without discharge. Neck: No apparent masses Lungs: Normal respirations with some wheezes, no rhonchi few rales , no crackles   Heart: Regular rate and rhythm, normal S1 S2, no murmur, no rub, no gallop, PMI is normal size and placement, carotid upstroke normal without bruit, jugular venous pressure normal Abdomen: Soft, non-distended with normoactive bowel sounds. No hepatosplenomegaly. Abdominal aorta is normal size without bruit Extremities: Trace edema, no clubbing, no cyanosis, no ulcers,  Peripheral: 2+ radial, 2+ femoral, 2+ dorsal pedal pulses Neuro: Intubated and sedated     Intake/Output Summary (Last 24 hours) at 12/15/2015 0937 Last data filed at 12/30/2015 0600  Gross per 24 hour  Intake          5000.47 ml  Output              965 ml  Net          4035.47 ml    Inpatient Medications:  . allopurinol  200 mg Oral BH-q7a  . atorvastatin  80 mg Oral QPM  . chlorhexidine  15 mL Mouth Rinse BID  . Influenza vac split quadrivalent PF  0.5 mL Intramuscular Tomorrow-1000  . insulin aspart  0-9 Units Subcutaneous Q6H  . ipratropium-albuterol  3 mL Nebulization Q6H  . levothyroxine  150 mcg Oral QAC breakfast  . lidocaine (cardiac) 100 mg/51ml      . mouth rinse  15 mL Mouth Rinse QID  . midazolam  4 mg Intravenous Once  . [START ON 01/05/2016] pantoprazole  40 mg Intravenous Q12H  . pneumococcal 23 valent vaccine  0.5 mL Intramuscular Tomorrow-1000  . sodium chloride flush  10-40  mL Intracatheter Q12H  . sodium chloride flush  3 mL Intravenous Q12H   Infusions:  . sodium chloride 100 mL/hr at 2015-07-03 0600  . fentaNYL infusion INTRAVENOUS 50 mcg/hr (2015-07-03 0901)  . lidocaine Stopped (2015-07-03 0610)  . norepinephrine (LEVOPHED) Adult infusion 60 mcg/min (2015-07-03 0648)  . pantoprozole (PROTONIX) infusion Stopped (2015-07-03 0610)  . phenylephrine  (NEO-SYNEPHRINE) Adult infusion    . vasopressin (PITRESSIN) infusion - *FOR SHOCK* 0.03 Units/min (2015-07-03 0816)    Labs:  Recent Labs  01/02/16 1140  01/02/16 2251 2015-07-03 0542  NA  --   < > 133* 132*  K 2.7*  < > 4.0 3.7  CL  --   < > 97* 98*  CO2  --   < > 23 19*  GLUCOSE  --   < > 317* 309*  BUN  --   < > 116* 119*  CREATININE  --   < > 3.92* 4.35*  CALCIUM  --   < > 8.5* 8.1*  MG  --   < > 2.5* 2.7*  PHOS 2.9  --  5.2*  --   < > = values in this interval not displayed.  Recent Labs  01/09/2016 1400 01/02/16 0750  AST 125* 129*  ALT 103* 102*  ALKPHOS 85 69  BILITOT 1.2 2.1*  PROT 7.9 6.1*  ALBUMIN 3.9 2.9*    Recent Labs  01/02/16 0750 2015-07-03 0542  WBC 9.2 7.1  HGB 10.9* 11.2*  HCT 31.1* 32.7*  MCV 88.4 91.6  PLT 116* 130*    Recent Labs  12/20/2015 1400 01/10/2016 1617 2015-07-03 0542  CKTOTAL  --   --  325  TROPONINI 0.16* 0.13* 11.11*   Invalid input(s): POCBNP  Recent Labs  12/26/2015 1400  HGBA1C 8.4*     Weights: Filed Weights   12/12/2015 1403 12/26/2015 1618  Weight: 211 lb (95.7 kg) 203 lb 14.8 oz (92.5 kg)     Radiology/Studies:  Dg Chest 1 View  Result Date: 12/25/2015 CLINICAL DATA:  Rectal bleeding for 5 days, dizziness, weakness EXAM: CHEST 1 VIEW COMPARISON:  06/25/2014 FINDINGS: Cardiomediastinal silhouette is stable. Elevation of the right hemidiaphragm again noted. Status post CABG. Single lead cardiac pacemaker is unchanged in position. No infiltrate or pulmonary edema. Stable scarring or atelectasis in lingula. IMPRESSION: No active disease. Electronically Signed   By: Natasha MeadLiviu  Pop M.D.   On: 12/16/2015 15:52   Dg Abd 1 View  Result Date: 01/02/2016 CLINICAL DATA:  OG tube EXAM: ABDOMEN - 1 VIEW COMPARISON:  None. FINDINGS: Enteric tube terminates in the proximal gastric body. IVC filter. Nonspecific but nonobstructive bowel gas pattern, without disproportionate dilatation of the small bowel. IMPRESSION: Enteric tube terminates  in the proximal gastric body. Electronically Signed   By: Charline BillsSriyesh  Krishnan M.D.   On: 01/02/2016 21:42   [place] Renal  Result Date: 01/02/2016 CLINICAL DATA:  Acute renal failure EXAM: RENAL / URINARY TRACT ULTRASOUND COMPLETE COMPARISON:  None. FINDINGS: Right Kidney: Length: 10.2 cm. Echogenicity within normal limits. No mass or hydronephrosis visualized. Left Kidney: Length: 10.0 cm. Echogenicity within normal limits. No mass or hydronephrosis visualized. Bladder: Decompressed with Foley catheter in place, not well visualized. IMPRESSION: No acute findings.  No hydronephrosis. Electronically Signed   By: Charlett NoseKevin  Dover M.D.   On: 01/02/2016 11:12   Dg Chest Port 1 View  Result Date: 01/02/2016 CLINICAL DATA:  Status post intubation, ET/OG tube placement EXAM: PORTABLE CHEST 1 VIEW COMPARISON:  01/07/2016 FINDINGS: Endotracheal tube terminates 5.5  cm above the carina. Mild patchy lingular opacity, likely scarring. No focal consolidation. No pleural effusion or pneumothorax. Cardiomegaly. Right subclavian ICD. Postsurgical changes related to prior CABG. Enteric tube terminates in the proximal gastric body. Median sternotomy. IMPRESSION: Endotracheal tube terminates 5.5 cm above the carina. Enteric tube terminates in the proximal gastric body. Electronically Signed   By: Charline Bills M.D.   On: 01/02/2016 21:41   US Abdomen Limited Ruq  Result Date: 01/02/2016 CLINICAL DATA:  Abnormal LFTs EXAM: US ABDOMEN LIMITED - RIGHT UPPER QUADRANT COMPARISON:  None. FINDINGS: Gallbladder: Gallbladder is mildly distended. Mild gallbladder wall thickening at 6 mm. No visible stones. Small amount sludge in the gallbladder. Negative sonographic Murphy's. Common bile duct: Diameter: Normal caliber, 4 mm Liver: Mildly increased echotexture suggesting fatty infiltration. No biliary ductal dilatation or focal lesion. IMPRESSION: Increased echotexture in the liver suggesting fatty infiltration. Gallbladder is mildly  distended with small amount of sludge. Mild gallbladder wall thickening. Cannot completely exclude acalculous cholecystitis. If there is clinical concern, consider nuclear medicine hepatobiliary scan. Electronically Signed   By: Charlett Nose M.D.   On: 01/02/2016 11:12     Assessment and Recommendation  70 y.o. male with known chronic systolic dysfunction congestive heart failure having evidence of bleeding due to high INR now normalized without evidence of further bleeding having further congestive heart failure episode due to recurrent ventricular tachycardia And myocardial infarction not responsive to intravenous medication management including amiodarone and lidocaine with intermittent bradycardia now status post a defibrillator reprogramming with no improvements 1. Further consideration of palliative care  Signed, Arnoldo Hooker M.D. FACC

## 2016-01-11 NOTE — Progress Notes (Signed)
Pt. Extubated and vent removed .

## 2016-01-11 NOTE — Progress Notes (Signed)
Update: Patient with multiple episodes tachyarrythmia overnight and this morning. Now with elevated troponin and tachy-brady issues, multiple medical interventions have been pursued including reprogramming of the AICD, which (unfortunately), patient has been refractory to.  Patient is now on three pressor and starting to pale appearance, with purplish coloration of finger and toes (along with being cool).  Myself and Dr. Gwen PoundsKowalski updated the family and given his poor prognosis and acutely declining status they have decided on withdrawal of care and comfort care.  Per Dr. Gwen PoundsKowalski, use a magnet to deactivate the AICD/Pacer.   RN Witness - Gardiner BarefootKatie C. and Jarvis MorganAdam S.  Comfort orders placed. Chaplain at bedside.    Stephanie AcreVishal Latesia Norrington, MD [facility] Pager [phone]- [phone] (please enter 7-digits) On Call Pager - [phone] (please enter 7-digits)

## 2016-01-11 NOTE — Progress Notes (Signed)
CH responded to Nurse request to visit with Pt and family. Pt was non responsive. Family was escorted to consult room where DR informed them of the situation. Family was faced with DNR decision. Family acknowledged and are awaiting extubation. CH provided the ministry of prayer and presence.  CH services are available 24/7 and will follow up as needed.    21-Jul-2015 1100  Clinical Encounter Type  Visited With Patient;Family;Health care provider  Visit Type Initial;Critical Care;Patient actively dying  Referral From Nurse  Spiritual Encounters  Spiritual Needs Prayer;Emotional;Grief support  Stress Factors  Patient Stress Factors Health changes;Major life changes  Family Stress Factors Exhausted;Health changes;Loss

## 2016-01-11 NOTE — Clinical Social Work Note (Signed)
CSW received consult for comfort care. CSW will follow pending any needs for emotional support.   Argentina PonderKaren Martha Regnald Bowens, MSW, LCSW-A [phone]

## 2016-01-11 NOTE — Progress Notes (Signed)
ANTICOAGULATION CONSULT NOTE - Initial Consult  Pharmacy Consult for [facility]  Indication: supra therapeutic INR   Allergies  Allergen Reactions  . Niaspan [Niacin Er] Other (See Comments)    Reaction:  Unknown   . Tape Rash    Patient Measurements: Height: 6' (182.9 cm) Weight: 203 lb 14.8 oz (92.5 kg) IBW/kg (Calculated) : 77.6 Heparin Dosing Weight:   Vital Signs: Temp: 98.5 F (36.9 C) (09/23 0400) Temp Source: Axillary (09/23 0400) BP: 83/63 (09/23 0615) Pulse Rate: 76 (09/23 0615)  Labs:  Recent Labs  Jan 24, 2016 1400 01/24/16 1617  01/02/16 0132  01/02/16 0750 01/02/16 1441 01/02/16 1705 01/02/16 2029 01/02/16 2251 01/10/2016 0542  HGB 11.5* 11.9*  --  11.3*  11.4*  --  10.9*  --   --   --   --  11.2*  HCT 34.2* 34.2*  --  32.7*  33.0*  --  31.1*  --   --   --   --  32.7*  PLT 196  --   --  137*  --  116*  --   --   --   --  130*  APTT 89*  --   --   --   --   --   --   --   --   --   --   LABPROT >90.0*  --   < > 13.9  < >  --  14.2  --  14.9  --  19.1*  INR >11.00*  --   < > 1.07  < >  --  1.10  --  1.16  --  1.59  CREATININE 5.27*  --   --  4.12*  --  3.71*  --  3.41*  --  3.92* 4.35*  CKTOTAL  --   --   --   --   --   --   --   --   --   --  325  TROPONINI 0.16* 0.13*  --   --   --   --   --   --   --   --  11.11*  < > = values in this interval not displayed.  Estimated Creatinine Clearance: 17.3 mL/min (by C-G formula based on SCr of 4.35 mg/dL (H)).   Medical History: Past Medical History:  Diagnosis Date  . AICD (automatic cardioverter/defibrillator) present   . Atrial fibrillation (HCC)   . CHF (congestive heart failure) (HCC)    chronic left-sided systolic  . Chronic kidney disease   . Decubitus ulcer on ankle   . Diabetes mellitus without complication (HCC)   . DVT (deep venous thrombosis) (HCC)   . DVT (deep venous thrombosis) (HCC)   . Gout   . Hyperthyroidism   . Ischemic cardiomyopathy   . Myocardial infarction (HCC)   . Neuropathy  (HCC)   . Osteoarthritis   . 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 Shortness of breath dyspnea   . Thyroid disease    hypothyroidism  . Venous stasis ulcer (HCC)   . Ventricular fibrillation (HCC)     Medications:  Prescriptions Prior to Admission  Medication Sig Dispense Refill Last Dose  . allopurinol (ZYLOPRIM) 100 MG tablet Take 200 mg by mouth every morning.    01-24-16 at Unknown time  . amiodarone (PACERONE) 200 MG tablet Take 200 mg by mouth every morning.    01-24-16 at Unknown time  . atorvastatin (LIPITOR) 80 MG tablet Take 80 mg  by mouth every evening.    12/31/2015 at Unknown time  . furosemide (LASIX) 40 MG tablet Take 40 mg by mouth 2 (two) times daily.   Aug 17, 2015 at Unknown time  . hydrochlorothiazide (HYDRODIURIL) 25 MG tablet Take 25 mg by mouth every morning.   Aug 17, 2015 at Unknown time  . levothyroxine (SYNTHROID, LEVOTHROID) 150 MCG tablet Take 150 mcg by mouth daily before breakfast.   Aug 17, 2015 at Unknown time  . metoprolol tartrate (LOPRESSOR) 25 MG tablet Take 1 tablet (25 mg total) by mouth 2 (two) times daily. 60 tablet 0 12/31/2015 at Unknown time  . potassium chloride SA (K-DUR,KLOR-CON) 20 MEQ tablet Take 20 mEq by mouth every morning.    Aug 17, 2015 at Unknown time  . insulin glargine (LANTUS) 100 UNIT/ML injection Inject 0.4 mLs (40 Units total) into the skin daily. 10 mL 2     Assessment: 9/21 :   INR @ 14:00 = 11.0              INR @ 20:00 = 1.12  09/22   INR @ 01:30 = 1.07             INR @ 14:41 = 1.10  09/23   INR @ 05:30 = 1.59  Goal of Therapy:  INR < 2   Plan:  Will draw INR every 6 hrs X 4 and then daily.   Ovila Lepage S 12/28/2015,6:46 AM

## 2016-01-11 NOTE — Progress Notes (Signed)
PULMONARY / CRITICAL CARE MEDICINE   Name: Nicholas Leonard MRN: [id number] DOB: 09-Dec-1945    ADMISSION DATE:  05-Jan-2016   CONSULTATION DATE: 01/02/2016  REFERRING MD: Dr. Juliene Pina  REASON FOR CONSULTATION:  Ventricular tachycardia with multiple AICD discharges, hypotension and LOC  HISTORY OF PRESENT ILLNESS:   This is a 70 year old Caucasian male who was initially admitted with rectal bleeding and hematemesis. He was being treated for an acute GI bleed on anticoagulation (Coumadin for DVT and atrial fibrillation). He was started on  Protonix and octreotide infusions and admitted to the ICU. Upon admission, his troponin was elevated and it was deemed that his elevated troponin was due to hypotension. He was continued on amiodarone and Coumadin for atrial fibrillation and DVT. He was also being followed by nephrology for a creatinine of 5.3 and a BUN of 157. This morning, patient started having short runs of V. tach. It was deemed that this was due to his low potassium. His potassium was replaced and he was loaded with amiodarone. Later this afternoon. Patient started having sustained ventricular tachycardia and his AICD fired multiple times. He became hypotensive and obtunded. Hence, he was emergently intubated. He is now on a lidocaine infusion, and sedated  SUBJECTIVE: Runs of V-tach at the beginning of the shift. EKG with undefined rhythm. Magnesium infusion and lidocaine 100mg  bolus given with some stabilization in HR. This morning patient developed tachy-brady arrhythmia with HR going from 130s to low 50s and 40s. Lidocaine and protonix infusions stopped per cardiology and 2 amps of bicarb given. STAT labs and ABG  VITAL SIGNS: BP (!) 71/56   Pulse 92   Temp 98.6 F (37 C) (Axillary)   Resp (!) 26   Ht 6' (1.829 m)   Wt 203 lb 14.8 oz (92.5 kg)   SpO2 100%   BMI 27.66 kg/m   HEMODYNAMICS:    VENTILATOR SETTINGS: Vent Mode: PRVC FiO2 (%):  [50 %-100 %] 50 % Set Rate:  [14  bmp-20 bmp] 20 bmp Vt Set:  [450 mL] 450 mL PEEP:  [8 cmH20] 8 cmH20  INTAKE / OUTPUT: I/O last 3 completed shifts: In: 7313.9 [P.O.:480; I.V.:4335.6; Blood:600; IV Piggyback:1898.3] Out: 2400 [Urine:2400]  PHYSICAL EXAMINATION: General: No acute distress, sedated Neuro: Exam limited due to sedation. Withdraws to emotional stimulus, positive gag reflex HEENT: The more cephalic and atraumatic, EMS, passages patent, oral mucosa with moderate secretions, pink, trachea midline, ET tube in place Cardiovascular: Apical pulse 86, regular, S1, S2 audible, no murmurs or thrills Lungs:  Bilateral airflow, breath sounds diminished in the bases, no wheezing Abdomen:  Nondistended, normal bowel sounds, palpation reveals no organomegaly Musculoskeletal:  No visible deformities. Extremities: Venostasis discoloration in lower extremities, +2 pulses, no edema Skin:  Her mother and dry  LABS:  BMET  Recent Labs Lab 01/02/16 0750 01/02/16 1140 01/02/16 1705 01/02/16 2251  NA 135  --  134* 133*  K 2.9* 2.7* 3.6 4.0  CL 94*  --  97* 97*  CO2 25  --  22 23  BUN 126*  --  113* 116*  CREATININE 3.71*  --  3.41* 3.92*  GLUCOSE 206*  --  285* 317*    Electrolytes  Recent Labs Lab 01/02/16 0132  01/02/16 0749 01/02/16 0750 01/02/16 1140 01/02/16 1705 01/02/16 2251  CALCIUM 8.3*  --   --  9.1  --  8.5* 8.5*  MG  --   < > 2.5*  --   --  2.2 2.5*  PHOS  4.2  --   --   --  2.9  --  5.2*  < > = values in this interval not displayed.  CBC  Recent Labs Lab 12/31/2015 1400 12/21/2015 1617 01/02/16 0132 01/02/16 0750  WBC 23.4*  --  10.8* 9.2  HGB 11.5* 11.9* 11.3*  11.4* 10.9*  HCT 34.2* 34.2* 32.7*  33.0* 31.1*  PLT 196  --  137* 116*    Coag's  Recent Labs Lab 01/07/2016 1400  01/02/16 0749 01/02/16 1441 01/02/16 2029  APTT 89*  --   --   --   --   INR >11.00*  < > 1.09 1.10 1.16  < > = values in this interval not displayed.  Sepsis Markers No results for input(s):  LATICACIDVEN, PROCALCITON, O2SATVEN in the last 168 hours.  ABG  Recent Labs Lab 01/02/16 1930  PHART 7.22*  PCO2ART 60*  PO2ART 290*    Liver Enzymes  Recent Labs Lab 12/31/2015 1400 01/02/16 0750  AST 125* 129*  ALT 103* 102*  ALKPHOS 85 69  BILITOT 1.2 2.1*  ALBUMIN 3.9 2.9*    Cardiac Enzymes  Recent Labs Lab 01/08/2016 1400 12/17/2015 1617  TROPONINI 0.16* 0.13*    Glucose  Recent Labs Lab 12/27/2015 2129 01/02/16 0737 01/02/16 1120 01/02/16 1612 01/02/16 1945 01/02/16 2349  GLUCAP 161* 193* 218* 230* 335* 305*    Imaging Dg Abd 1 View  Result Date: 01/02/2016 CLINICAL DATA:  OG tube EXAM: ABDOMEN - 1 VIEW COMPARISON:  None. FINDINGS: Enteric tube terminates in the proximal gastric body. IVC filter. Nonspecific but nonobstructive bowel gas pattern, without disproportionate dilatation of the small bowel. IMPRESSION: Enteric tube terminates in the proximal gastric body. Electronically Signed   By: Charline BillsSriyesh  Krishnan M.D.   On: 01/02/2016 21:42   [place] Renal  Result Date: 01/02/2016 CLINICAL DATA:  Acute renal failure EXAM: RENAL / URINARY TRACT ULTRASOUND COMPLETE COMPARISON:  None. FINDINGS: Right Kidney: Length: 10.2 cm. Echogenicity within normal limits. No mass or hydronephrosis visualized. Left Kidney: Length: 10.0 cm. Echogenicity within normal limits. No mass or hydronephrosis visualized. Bladder: Decompressed with Foley catheter in place, not well visualized. IMPRESSION: No acute findings.  No hydronephrosis. Electronically Signed   By: Charlett NoseKevin  Dover M.D.   On: 01/02/2016 11:12   Dg Chest Port 1 View  Result Date: 01/02/2016 CLINICAL DATA:  Status post intubation, ET/OG tube placement EXAM: PORTABLE CHEST 1 VIEW COMPARISON:  01/09/2016 FINDINGS: Endotracheal tube terminates 5.5 cm above the carina. Mild patchy lingular opacity, likely scarring. No focal consolidation. No pleural effusion or pneumothorax. Cardiomegaly. Right subclavian ICD. Postsurgical changes  related to prior CABG. Enteric tube terminates in the proximal gastric body. Median sternotomy. IMPRESSION: Endotracheal tube terminates 5.5 cm above the carina. Enteric tube terminates in the proximal gastric body. Electronically Signed   By: Charline BillsSriyesh  Krishnan M.D.   On: 01/02/2016 21:41   [place] Abdomen Limited Ruq  Result Date: 01/02/2016 CLINICAL DATA:  Abnormal LFTs EXAM: US ABDOMEN LIMITED - RIGHT UPPER QUADRANT COMPARISON:  None. FINDINGS: Gallbladder: Gallbladder is mildly distended. Mild gallbladder wall thickening at 6 mm. No visible stones. Small amount sludge in the gallbladder. Negative sonographic Murphy's. Common bile duct: Diameter: Normal caliber, 4 mm Liver: Mildly increased echotexture suggesting fatty infiltration. No biliary ductal dilatation or focal lesion. IMPRESSION: Increased echotexture in the liver suggesting fatty infiltration. Gallbladder is mildly distended with small amount of sludge. Mild gallbladder wall thickening. Cannot completely exclude acalculous cholecystitis. If there is clinical concern, consider nuclear medicine  hepatobiliary scan. Electronically Signed   By: Charlett Nose M.D.   On: 01/02/2016 11:12    STUDIES:  01/02/2016 2-D echo on that  CULTURES: MRSA screen negative  ANTIBIOTICS: None  SIGNIFICANT EVENTS: 01-25-2016: Admitted with acute GI bleed 01/02/2016: Ventricular tachycardia with multiple AICD charges, became obtunded, hypotensive and intubated  LINES/TUBES: Right femoral CVL placed on 01/02/2016 ET tube placed 01/02/2016  DISCUSSION: 70 year old Caucasian male with multiple comorbidities presenting with wide-complex and jugular tachycardia, hypotension, acute respiratory failure due to loss of consciousness, and acute GI bleed on anticoagulation.  ASSESSMENT / PLAN:  PULMONARY A:  Acute respiratory failure secondary to loss of consciousness and ventricular tachycardia with hypotension. P:   Full vent support with current  settings ABG reviewed-acidosis improved Chest x-ray in the morning VAP Protocol Nebulized bronchodilators  CARDIOVASCULAR A:  Tachy-Brady arrhythmia with Wide-complex ventricular tachycardia and sinus brady cardia in the 40s. Hypotension History of ischemic cardiomyopathy, status post AICD placement Chronic systolic congestive heart failure. History of MI History of atrial fibrillation P:  Cardiology following; appreciate recommendations. Lidocaine infusion infusion stopped per cardiology Hemodynamic monitoring per ICU protocol F/u 2-D echo i Continue Lipitor 80 mg daily Discontinue anticoagulation in light of new GI bleed Monitor and aggressively replace electrolytes.; Maintain potassium 4 and above of and magnesium 2.5 and above  RENAL A:   Acute renal failure-his baseline creatinine is 1.9, creatinine rose to 5.3 on admission and currently down to 4.4.; Likely acute tubular necrosis secondary to volume depletion Hypokalemia P:   Continue volume replacement. Monitor and replace electrolytes Renally dose medications. Trend creatinine Nephrology following  GASTROINTESTINAL A:   Acute GI bleed on anticoagulation Elevated LFTs. P:   GI following; appreciate recommendations. Continue octreotide gtte Protonix infusion discontinued per GI monitor for bleeding and trend hemoglobin and hematocrit Trend LFTs  HEMATOLOGIC A:   Acute blood loss anemia-hemoglobin stable at 10.9 History of DVT P:  Trend hemoglobin and transfuse if hemoglobin less than 7 Hold all anticoagulation  ENDOCRINE A:   History of type 2 diabetes History of hyperthyroidism P:   Blood glucose monitoring with sliding scale insulin coverage sliding  continue home dose of Synthroid Check TSH with a.m. labs  NEUROLOGIC A:   Acute loss of consciousness secondary to hypotension and arrhythmia. History of peripheral neuropathy secondary to diabetes  P:   RASS goal: -1 to -2 Fentanyl and Versed  for vent sedation and comfort. Monitor neurological status closely   Disposition and family update: No family at bedside. Will update when available.   Magdalene S. Tukov ANP-BC Pulmonary and Critical Care Medicine [facility] Pager [phone] or [phone]  STAFF NOTE: I, Dr. Stephanie Acre have personally reviewed patient's available data, including medical history, events of note, physical examination and test results as part of my evaluation. I have discussed with resident/NP NP Karin Golden and other care providers such as pharmacist, RN and RRT.  In addition,  I personally evaluated patient and elicited key findings of   Patient with worsening clinical status overnight to include worsening episodes of ventricular tachycardia followed by hypotension and increase FiO2 requirement. Cardiology at the bedside this morning, AICD evaluated with [employer] Rep.  Given multiorgan system failure and acute declining cardiac function along with hypotension and 3 pressor requirement, family decided on withdrawal of care and comfort care.    Marland Kitchen  Rest per NP/medical resident whose note is outlined above and that I agree with  The patient is critically ill with multiple organ  systems failure and requires high complexity decision making for assessment and support, frequent evaluation and titration of therapies, application of advanced monitoring technologies and extensive interpretation of multiple databases.   Critical Care Time devoted to patient care services described in this note is 35 Minutes.   This time reflects time of care of this signee Dr Stephanie Acre.  This critical care time does not reflect procedure time, or teaching time or supervisory time of PA/NP/Med-student/Med Resident etc but could involve care discussion time.  Stephanie Acre, MD [place] [facility] Pager [phone] (please enter 7-digits) On Call Pager - [phone] (please enter  7-digits)  Note: This note was prepared with Dragon dictation along with smaller phrase technology. Any transcriptional errors that result from this process are unintentional.

## 2016-01-11 NOTE — Progress Notes (Signed)
[facility]  Patient: Nicholas DesanctisFreddie Edward Farooq / Admit Date: 12/31/2015 / Date of Encounter: 01/08/2016, 6:30 AM   Subjective: Patient is still sedated and hemodynamically stable with the knee in levo fed for blood pressure control. Patient continues to have runs of wide complex tachycardia between 10 and 16 beats followed by sinus bradycardia with some hemodynamic compromise during wide complex. Previous concerns were that the patient had long QT interval tachycardia and therefore magnesium infusion was started with some slight improvements. Additionally the patient had a discontinuation of amiodarone to reduce the possibility of long QT interval. Lidocaine was continued although now causing some bradycardia. Lidocaine is discontinued at this time due to concerns of significant side effects. The patient does have reasonable oxygenation after intubation without any other signs or symptoms of bleeding or complication after previous deep venous thrombosis treatment and high INR with rectal bleeding. The patient has had repeat laboratories showing no evidence of electrolyte abnormalities at this time Discussion with family of above issues has ensued and we have of further considered other options including the possibility of overdrive pacing  Review of Systems:   Objective: Telemetry: Sinus bradycardia intermixed with long runs of wide complex tachycardia Physical Exam: Blood pressure (!) 83/63, pulse 76, temperature 98.5 F (36.9 C), temperature source Axillary, resp. rate (!) 24, height 6' (1.829 m), weight 203 lb 14.8 oz (92.5 kg), SpO2 100 %. Body mass index is 27.66 kg/m. General: Intubated and sedated Head: Normocephalic, atraumatic, sclera non-icteric, no xanthomas, nares are without discharge. Neck: No apparent masses Lungs: Normal respirations with some wheezes, no rhonchi few rales , no crackles   Heart: Regular rate and rhythm, normal S1 S2, no murmur, no  rub, no gallop, PMI is normal size and placement, carotid upstroke normal without bruit, jugular venous pressure normal Abdomen: Soft, non-tender, non-distended with normoactive bowel sounds. No hepatosplenomegaly. Abdominal aorta is normal size without bruit Extremities: Trace edema, no clubbing, no cyanosis, no ulcers,  Peripheral: 2+ radial, 2+ femoral, 2+ dorsal pedal pulses Neuro: Intubated and sedated     Intake/Output Summary (Last 24 hours) at 01/08/2016 0630 Last data filed at 12/18/2015 0600  Gross per 24 hour  Intake          5940.37 ml  Output             1265 ml  Net          4675.37 ml    Inpatient Medications:  . allopurinol  200 mg Oral [id number]  . atorvastatin  80 mg Oral QPM  . chlorhexidine  15 mL Mouth Rinse BID  . Influenza vac split quadrivalent PF  0.5 mL Intramuscular Tomorrow-1000  . insulin aspart  0-9 Units Subcutaneous Q6H  . ipratropium-albuterol  3 mL Nebulization Q6H  . levothyroxine  150 mcg Oral QAC breakfast  . lidocaine (cardiac) 100 mg/435ml      . mouth rinse  15 mL Mouth Rinse QID  . midazolam  4 mg Intravenous Once  . [START ON 01/05/2016] pantoprazole  40 mg Intravenous Q12H  . pneumococcal 23 valent vaccine  0.5 mL Intramuscular Tomorrow-1000  . sodium bicarbonate      . sodium bicarbonate  100 mEq Intravenous Once  . sodium chloride flush  10-40 mL Intracatheter Q12H  . sodium chloride flush  3 mL Intravenous Q12H   Infusions:  . sodium chloride 100 mL/hr at 12/23/2015 0600  . fentaNYL infusion INTRAVENOUS 25 mcg/hr (01/09/2016 0600)  . lidocaine 2 mg/min (  12/20/2015 0600)  . norepinephrine (LEVOPHED) Adult infusion    . octreotide  (SANDOSTATIN)    IV infusion 50 mcg/hr (12/16/2015 0600)  . pantoprozole (PROTONIX) infusion 8 mg/hr (12/31/2015 0600)  . phenylephrine (NEO-SYNEPHRINE) Adult infusion Stopped (12/29/2015 0530)    Labs:  Recent Labs  01/02/16 1140  01/02/16 2251 01/06/2016 0542  NA  --   < > 133* 132*  K 2.7*  < > 4.0 3.7  CL  --   <  > 97* 98*  CO2  --   < > 23 19*  GLUCOSE  --   < > 317* 309*  BUN  --   < > 116* 119*  CREATININE  --   < > 3.92* 4.35*  CALCIUM  --   < > 8.5* 8.1*  MG  --   < > 2.5* 2.7*  PHOS 2.9  --  5.2*  --   < > = values in this interval not displayed.  Recent Labs  01/17/2016 1400 01/02/16 0750  AST 125* 129*  ALT 103* 102*  ALKPHOS 85 69  BILITOT 1.2 2.1*  PROT 7.9 6.1*  ALBUMIN 3.9 2.9*    Recent Labs  01/02/16 0750 01/08/2016 0542  WBC 9.2 7.1  HGB 10.9* 11.2*  HCT 31.1* 32.7*  MCV 88.4 91.6  PLT 116* 130*    Recent Labs  01-17-2016 1400 01/17/16 1617  TROPONINI 0.16* 0.13*   Invalid input(s): POCBNP  Recent Labs  Jan 17, 2016 1400  HGBA1C 8.4*     Weights: Filed Weights   2016/01/17 1403 01/17/16 1618  Weight: 211 lb (95.7 kg) 203 lb 14.8 oz (92.5 kg)     Radiology/Studies:  Dg Chest 1 View  Result Date: 01/17/16 CLINICAL DATA:  Rectal bleeding for 5 days, dizziness, weakness EXAM: CHEST 1 VIEW COMPARISON:  06/25/2014 FINDINGS: Cardiomediastinal silhouette is stable. Elevation of the right hemidiaphragm again noted. Status post CABG. Single lead cardiac pacemaker is unchanged in position. No infiltrate or pulmonary edema. Stable scarring or atelectasis in lingula. IMPRESSION: No active disease. Electronically Signed   By: Natasha Mead M.D.   On: 01/17/2016 15:52   Dg Abd 1 View  Result Date: 01/02/2016 CLINICAL DATA:  OG tube EXAM: ABDOMEN - 1 VIEW COMPARISON:  None. FINDINGS: Enteric tube terminates in the proximal gastric body. IVC filter. Nonspecific but nonobstructive bowel gas pattern, without disproportionate dilatation of the small bowel. IMPRESSION: Enteric tube terminates in the proximal gastric body. Electronically Signed   By: Charline Bills M.D.   On: 01/02/2016 21:42   US Renal  Result Date: 01/02/2016 CLINICAL DATA:  Acute renal failure EXAM: RENAL / URINARY TRACT ULTRASOUND COMPLETE COMPARISON:  None. FINDINGS: Right Kidney: Length: 10.2 cm.  Echogenicity within normal limits. No mass or hydronephrosis visualized. Left Kidney: Length: 10.0 cm. Echogenicity within normal limits. No mass or hydronephrosis visualized. Bladder: Decompressed with Foley catheter in place, not well visualized. IMPRESSION: No acute findings.  No hydronephrosis. Electronically Signed   By: Charlett Nose M.D.   On: 01/02/2016 11:12   Dg Chest Port 1 View  Result Date: 01/02/2016 CLINICAL DATA:  Status post intubation, ET/OG tube placement EXAM: PORTABLE CHEST 1 VIEW COMPARISON:  01-17-2016 FINDINGS: Endotracheal tube terminates 5.5 cm above the carina. Mild patchy lingular opacity, likely scarring. No focal consolidation. No pleural effusion or pneumothorax. Cardiomegaly. Right subclavian ICD. Postsurgical changes related to prior CABG. Enteric tube terminates in the proximal gastric body. Median sternotomy. IMPRESSION: Endotracheal tube terminates 5.5 cm above the carina. Enteric  tube terminates in the proximal gastric body. Electronically Signed   By: Charline Bills M.D.   On: 01/02/2016 21:41   US Abdomen Limited Ruq  Result Date: 01/02/2016 CLINICAL DATA:  Abnormal LFTs EXAM: US ABDOMEN LIMITED - RIGHT UPPER QUADRANT COMPARISON:  None. FINDINGS: Gallbladder: Gallbladder is mildly distended. Mild gallbladder wall thickening at 6 mm. No visible stones. Small amount sludge in the gallbladder. Negative sonographic Murphy's. Common bile duct: Diameter: Normal caliber, 4 mm Liver: Mildly increased echotexture suggesting fatty infiltration. No biliary ductal dilatation or focal lesion. IMPRESSION: Increased echotexture in the liver suggesting fatty infiltration. Gallbladder is mildly distended with small amount of sludge. Mild gallbladder wall thickening. Cannot completely exclude acalculous cholecystitis. If there is clinical concern, consider nuclear medicine hepatobiliary scan. Electronically Signed   By: Charlett Nose M.D.   On: 01/02/2016 11:12     Assessment and  Recommendation  70 y.o. male with known chronic systolic dysfunction congestive heart failure having evidence of bleeding due to high INR now normalized without evidence of further bleeding having further congestive heart failure episode due to recurrent ventricular tachycardia not responsive to intravenous medication management including amiodarone and lidocaine with intermittent bradycardia 1. Continue supportive care with intubation and oxygenation 2. Discontinuation of antiarrhythmics due to concerns of long QT interval and unresponsiveness 3. Further consideration of possible overdrive pacing if able using a defibrillator 4. Further treatment options after above  Signed, Arnoldo Hooker M.D. FACC

## 2016-01-11 NOTE — Progress Notes (Signed)
Pt had bradycardic episode that did not rebound.  Pt given 0.5mg  Atropine, with no change in pulse.  Pt given another 0.5mg  Atropine for a total of 1mg .

## 2016-01-11 NOTE — Progress Notes (Signed)
Pts wife at bedside to see patient.  Tearful, and update on patient condition given.

## 2016-01-11 NOTE — Progress Notes (Signed)
Dr. Dema SeverinMungal at bedside to attempt A-Line insertion.

## 2016-01-11 NOTE — Progress Notes (Signed)
Update:  continues to have multiple episodes of ventricular tachycardia, cardiology currently at the bedside. At this time tentative plan is to discuss AICD settings with [employer] possibly consider overdrive pacing to further assist with stabilization of the patient.  Stop octreotide since it could increase QTc interval  Stephanie AcreVishal Emmaly Leech, MD [place] [facility] Pager [phone] (please enter 7-digits) On Call Pager - [phone] (please enter 7-digits)

## 2016-01-11 DEATH — deceased

## 2017-03-12 IMAGING — US US RENAL KIDNEY
1 series · 14 of 25 positions shown · non-contrast
Comparison: None.

CLINICAL DATA: Acute renal failure

EXAM:
RENAL/URINARY TRACT ULTRASOUND COMPLETE

[Series 1: us renal kidney · 0.27mm/px · 14 of 37 slices shown]
[im 1/37]
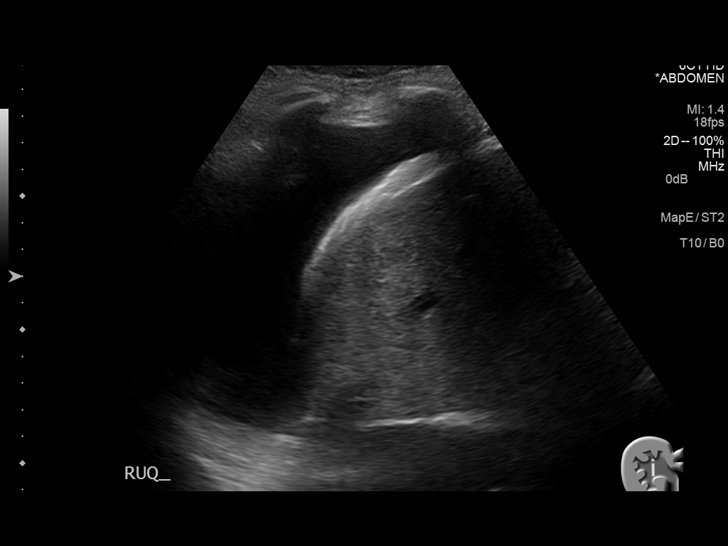
[im 4/37]
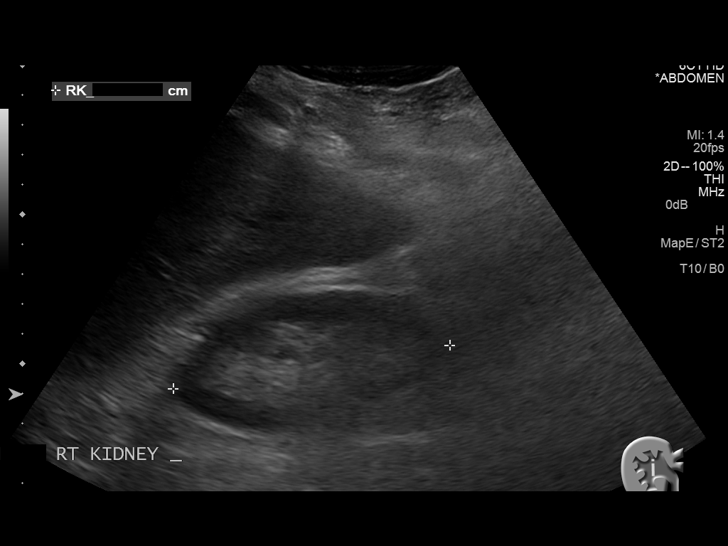
[im 7/37]
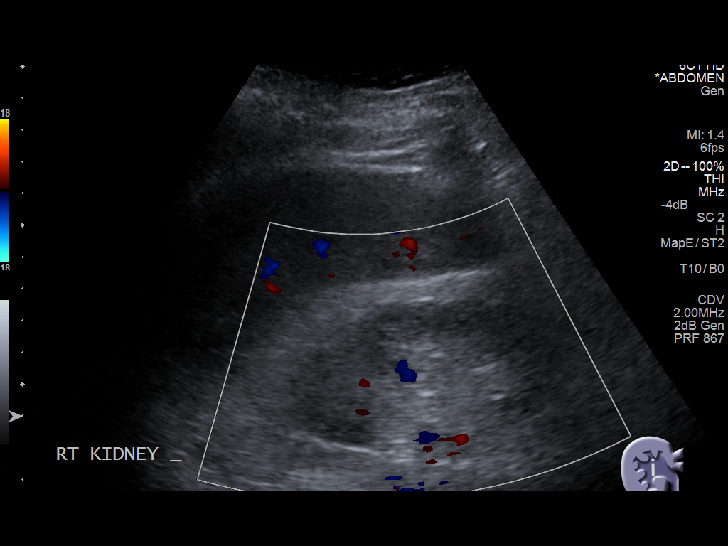
[im 10/37]
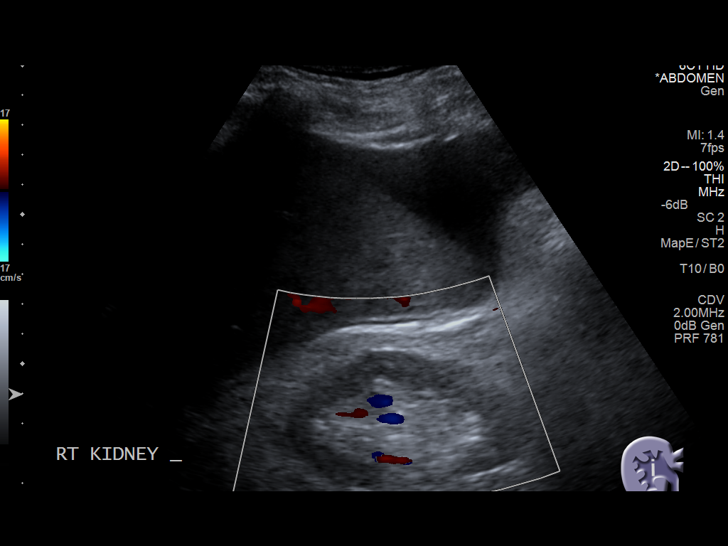
[im 13/37]
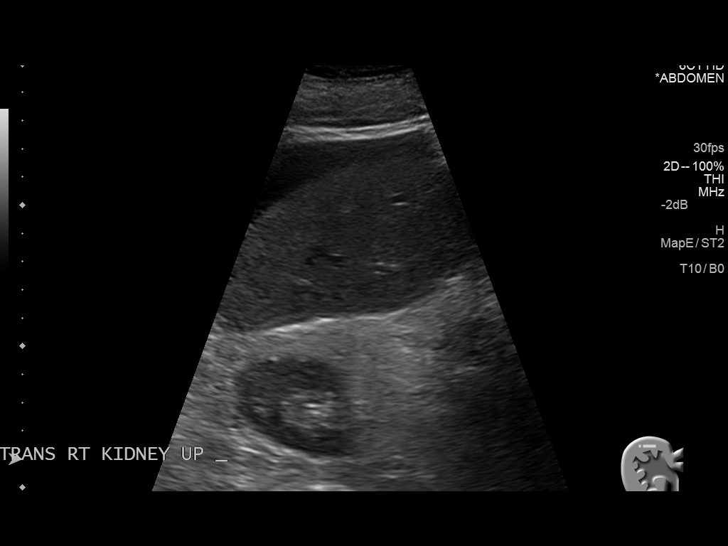
[im 14/37]
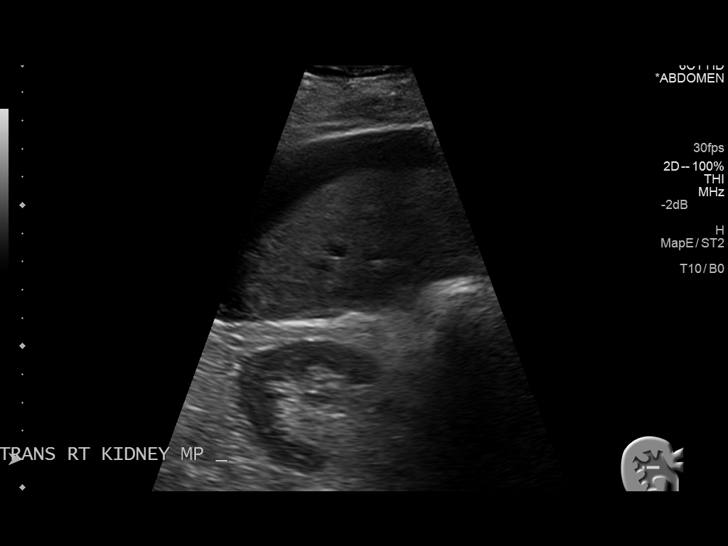
[im 17/37]
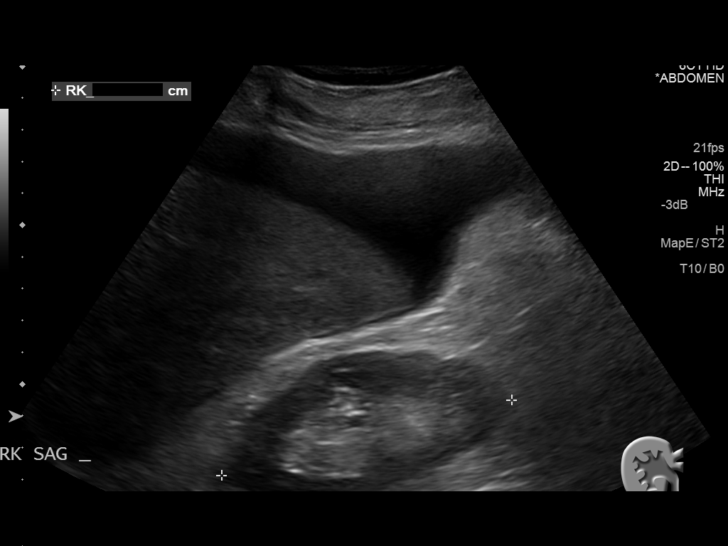
[im 20/37]
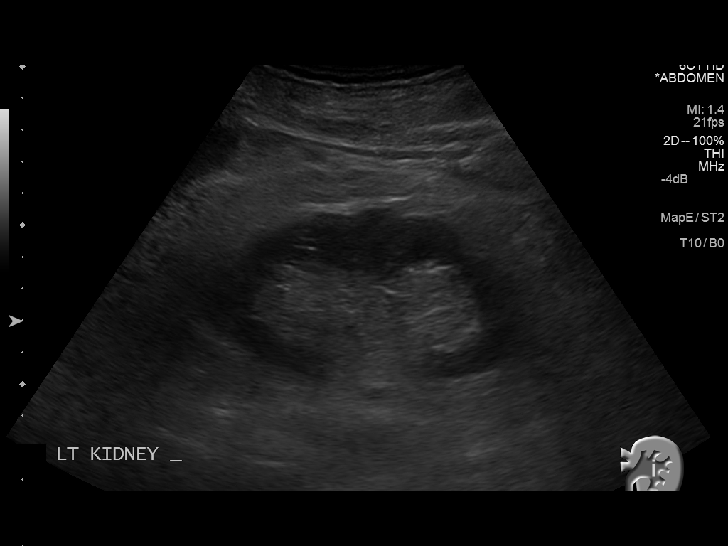
[im 23/37]
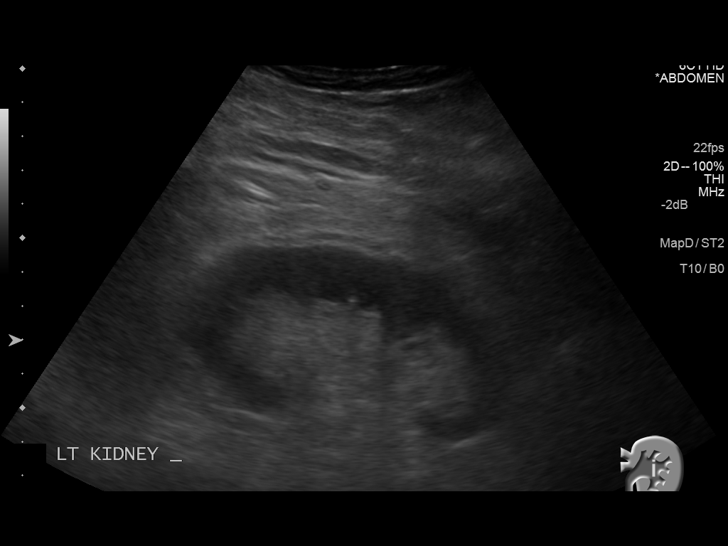
[im 25/37]
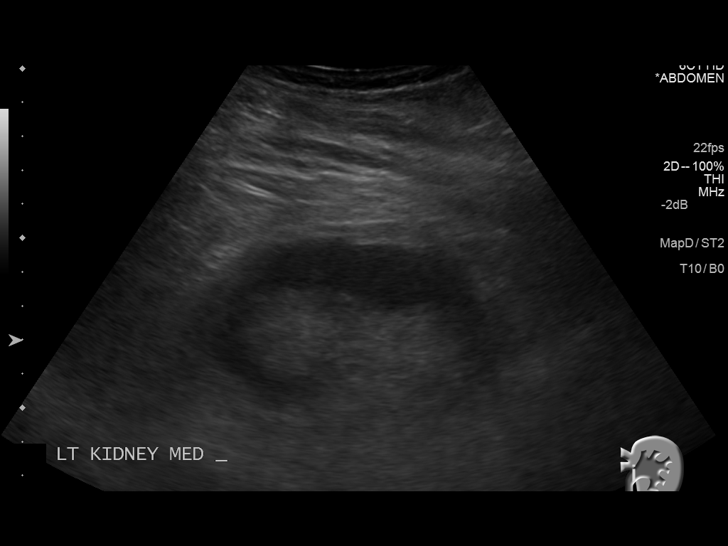
[im 28/37]
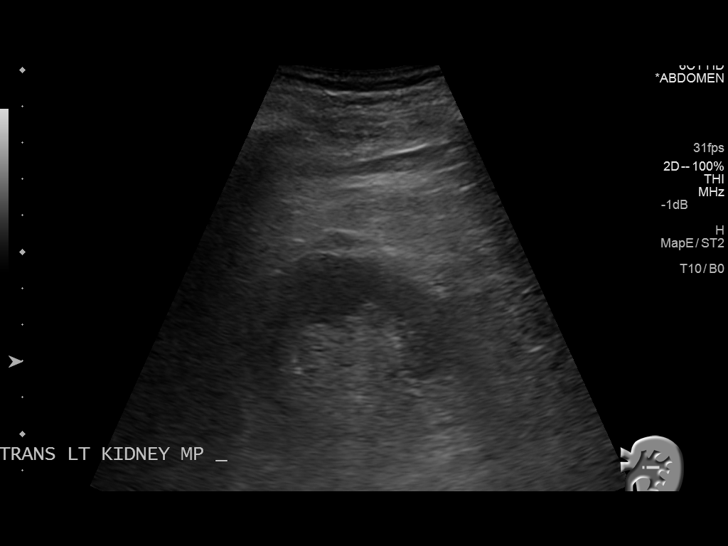
[im 31/37]
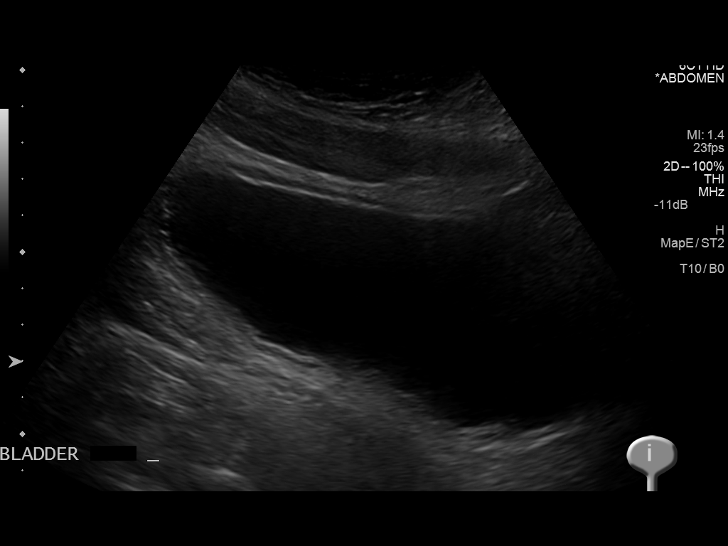
[im 34/37]
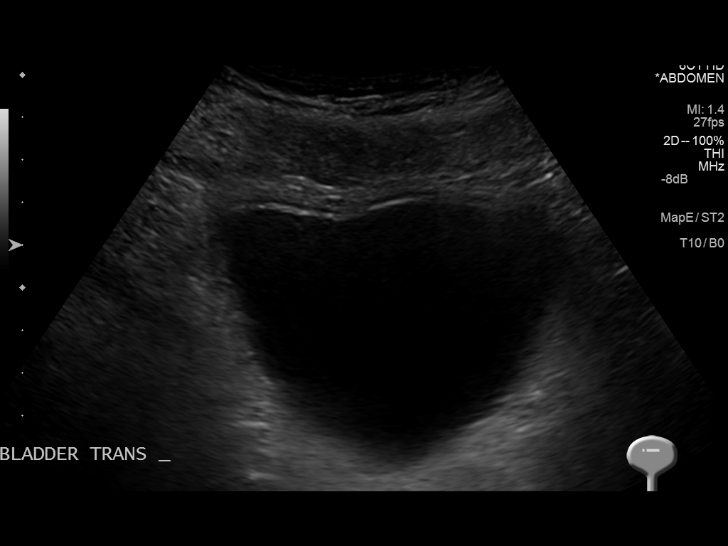
[im 37/37]
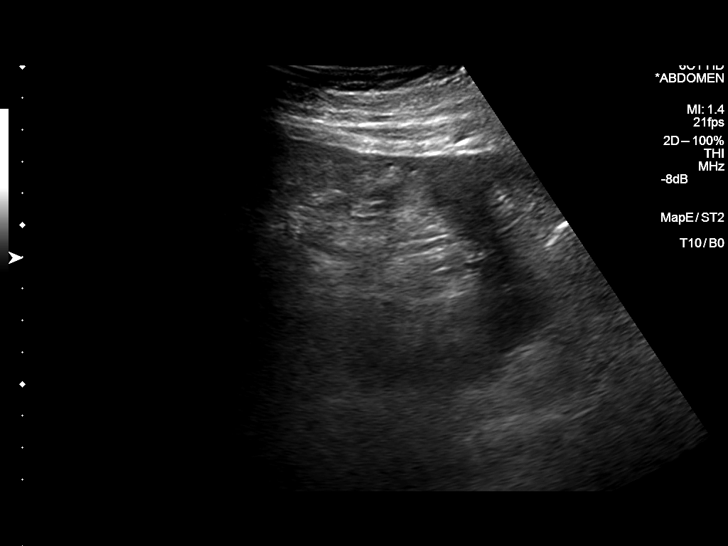

[14 of 25 positions shown; findings below may reference images not displayed]

FINDINGS: Right Kidney:

Length: 9.4 cm. Echogenicity within normal limits. No mass or
hydronephrosis visualized.

Left Kidney:

Length: 10.4 cm. Echogenicity within normal limits. No mass or
hydronephrosis visualized.

Bladder:

There is thickening of urinary bladder wall up to 7 mm. Cystitis
cannot be excluded. Bilateral small pleural effusion. Abdominal
ascites is noted.
IMPRESSION: 1. No hydronephrosis.  No renal calculi.
2. Thickening of urinary bladder wall.  Cystitis cannot be excluded.
3. Bilateral pleural effusion.  Abdominal ascites.

## 2017-04-26 IMAGING — XA NM PERIPHERAL VASCULAR CATHETERIZATION - [facility] NRPT
12 of 13 series · 15 of 24 positions shown · IV contrast (IODINE)
Comparison: none

[Series 1: care aorta · 1 of 2 slices shown (1 of 12)]
[im 1/2]
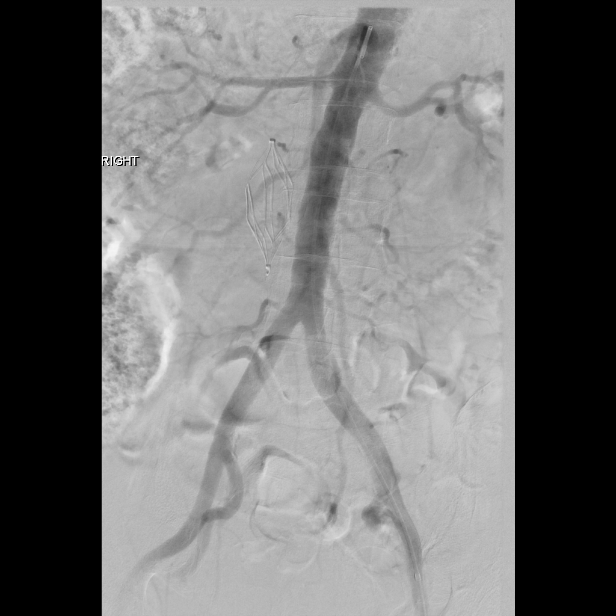

[Series 2: care aorta · 1 of 3 slices shown (2 of 12)]
[im 3/3]
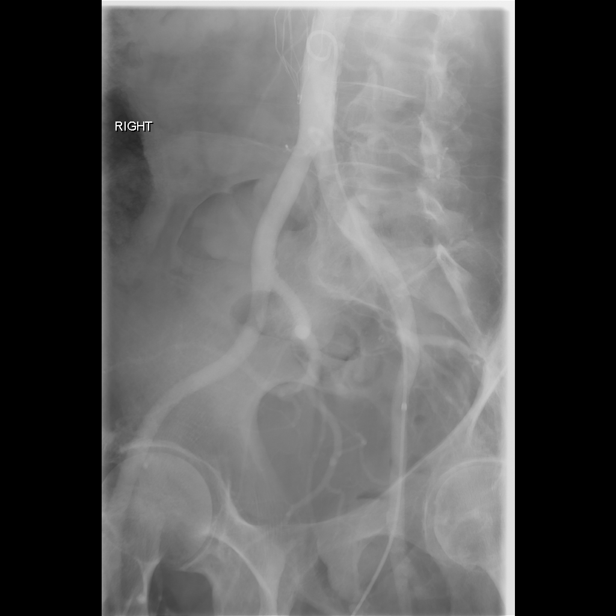

[Series 4: care aorta · 2 of 2 slices shown (3 of 12)]
[im 1/2]
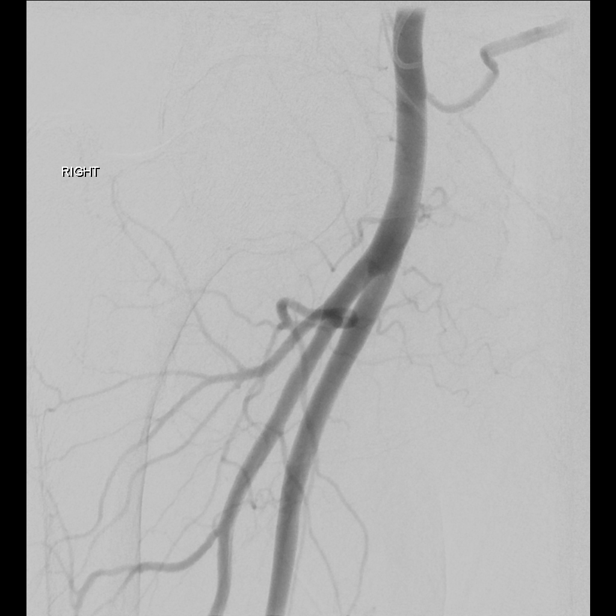
[im 2/2]
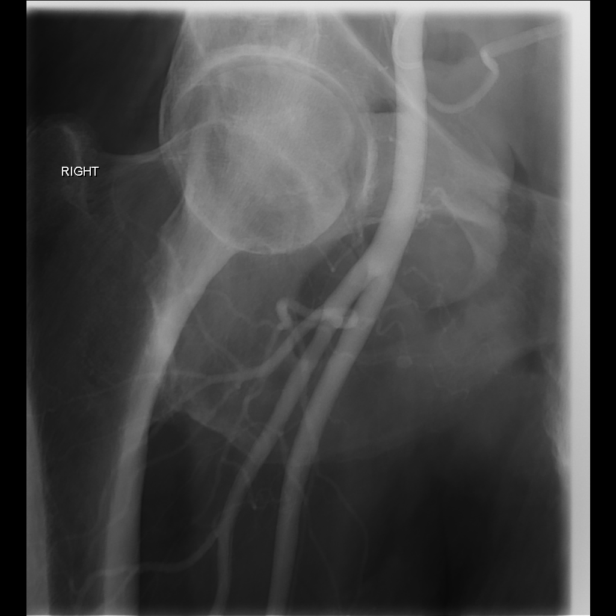

[Series 6: care aorta · 1 of 2 slices shown (4 of 12)]
[im 2/2]
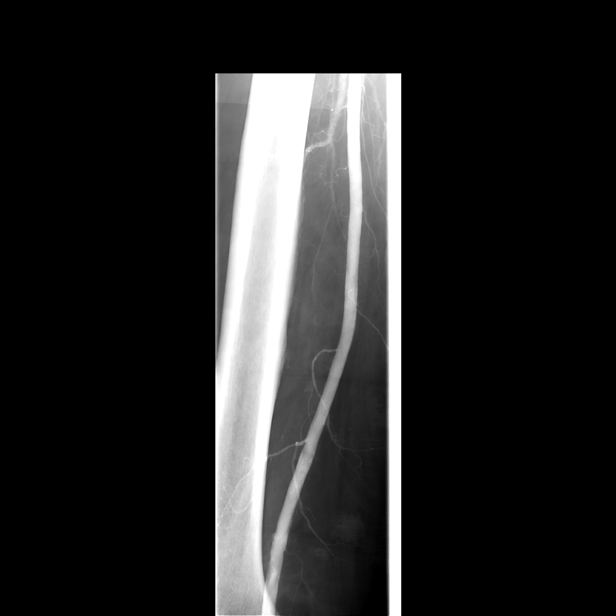

[Series 7: care aorta · 1 of 2 slices shown (5 of 12)]
[im 1/2]
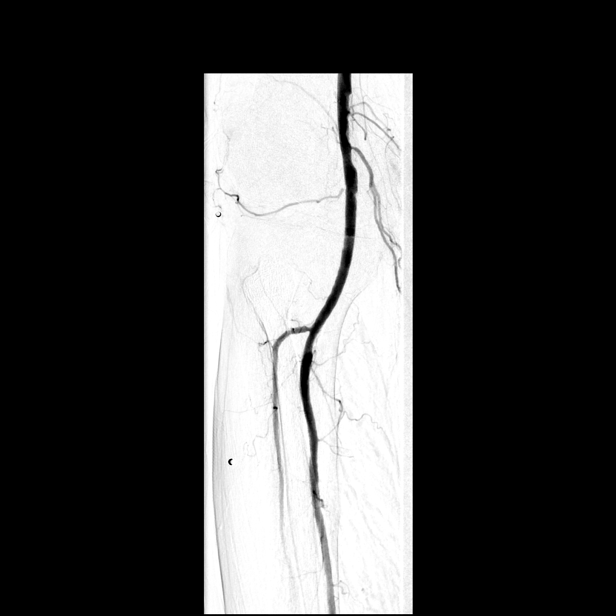

[Series 8: care aorta · 1 of 2 slices shown (6 of 12)]
[im 1/2]
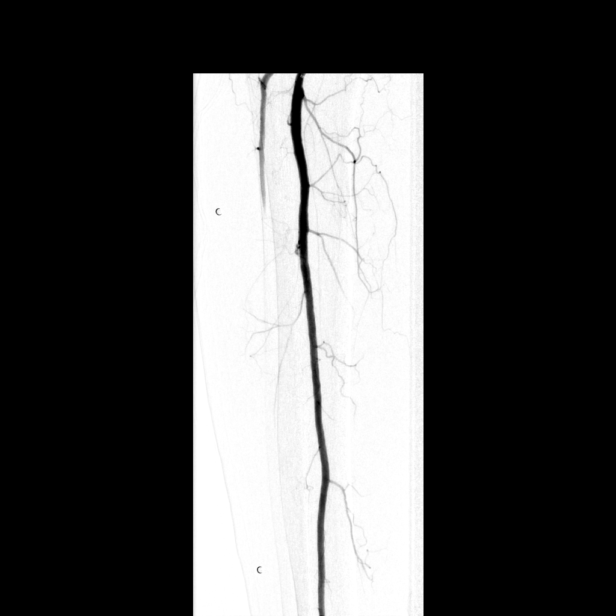

[Series 9: care aorta · 2 of 2 slices shown (7 of 12)]
[im 1/2]
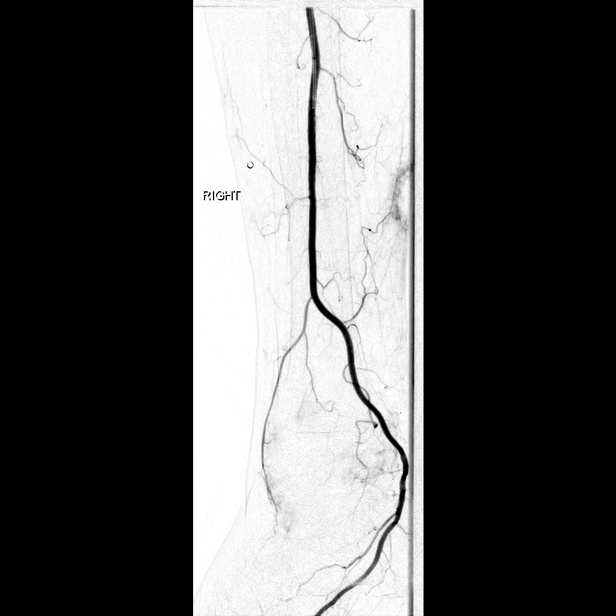
[im 2/2]
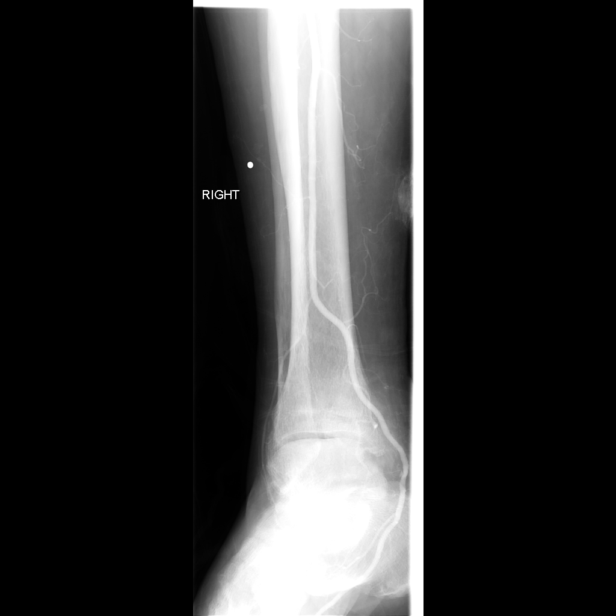

[Series 10: care aorta · 1 of 2 slices shown (8 of 12)]
[im 2/2]
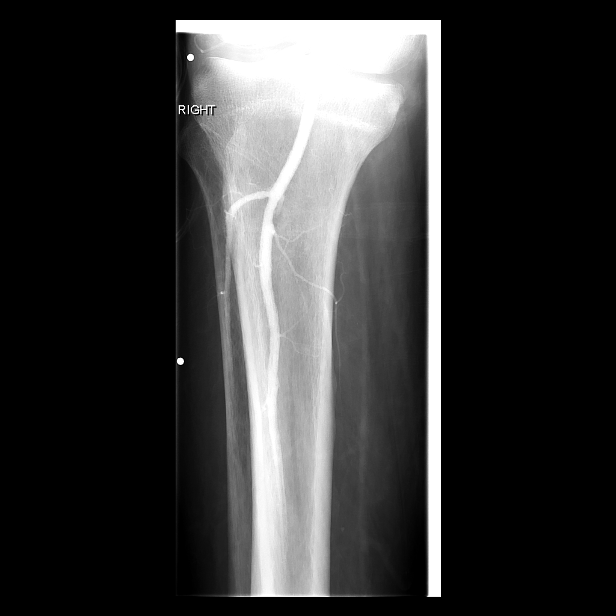

[Series 11: care aorta · 1 of 2 slices shown (9 of 12)]
[im 1/2]
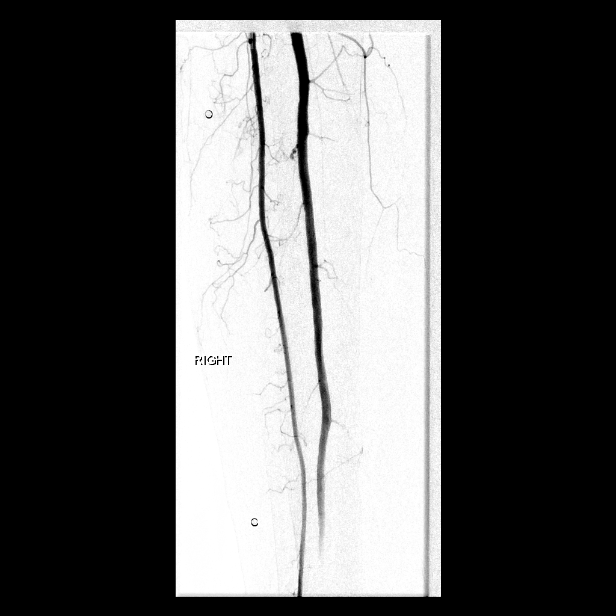

[Series 12: care aorta · 1 of 2 slices shown (10 of 12)]
[im 1/2]
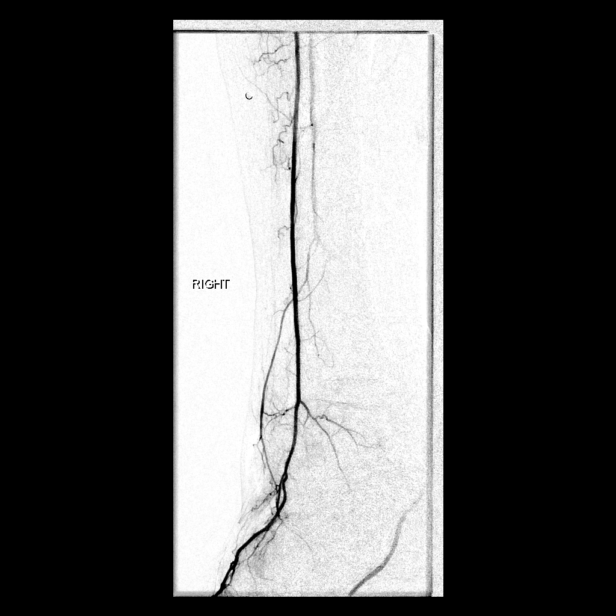

[Series 13: care aorta · 2 of 2 slices shown (11 of 12)]
[im 1/2]
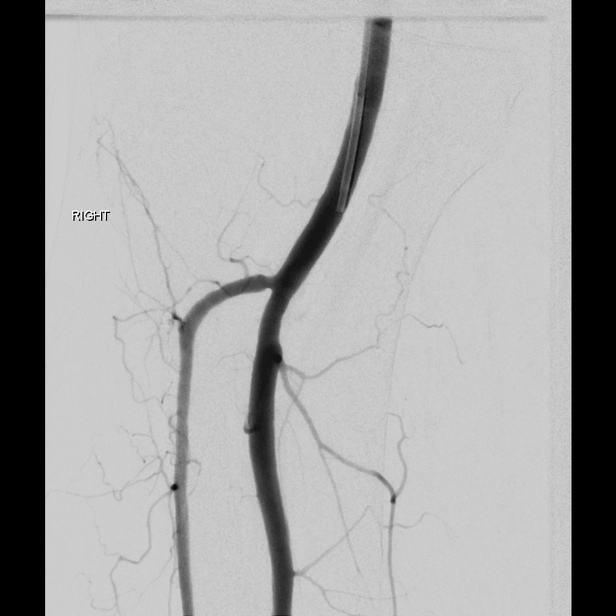
[im 2/2]
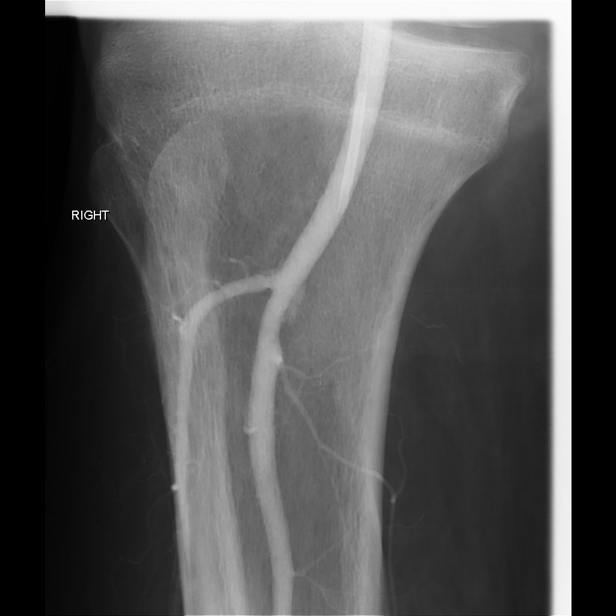

[Series 14: care aorta · 1 of 2 slices shown (12 of 12)]
[im 2/2]
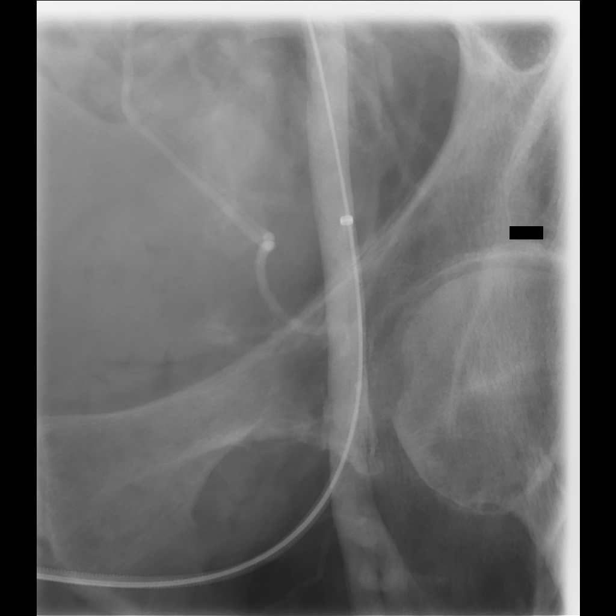

[15 of 24 positions shown; findings below may reference images not displayed]

Canned report from images found in remote index.

Refer to host system for actual result text.
